# Patient Record
Sex: Male | Born: 1940 | Race: White | Hispanic: No | Marital: Married | Smoking: Former smoker
Health system: Southern US, Community
[De-identification: ages and names within clinical notes are randomized; demographics above are authoritative.]

## PROBLEM LIST (undated history)

## (undated) DIAGNOSIS — R451 Restlessness and agitation: Secondary | ICD-10-CM

## (undated) DIAGNOSIS — K635 Polyp of colon: Secondary | ICD-10-CM

## (undated) DIAGNOSIS — K5900 Constipation, unspecified: Secondary | ICD-10-CM

## (undated) DIAGNOSIS — R296 Repeated falls: Secondary | ICD-10-CM

## (undated) DIAGNOSIS — N390 Urinary tract infection, site not specified: Secondary | ICD-10-CM

## (undated) DIAGNOSIS — R634 Abnormal weight loss: Secondary | ICD-10-CM

## (undated) DIAGNOSIS — R159 Full incontinence of feces: Secondary | ICD-10-CM

## (undated) DIAGNOSIS — F329 Major depressive disorder, single episode, unspecified: Principal | ICD-10-CM

## (undated) DIAGNOSIS — N312 Flaccid neuropathic bladder, not elsewhere classified: Secondary | ICD-10-CM

## (undated) DIAGNOSIS — I10 Essential (primary) hypertension: Secondary | ICD-10-CM

## (undated) DIAGNOSIS — R32 Unspecified urinary incontinence: Secondary | ICD-10-CM

## (undated) DIAGNOSIS — E785 Hyperlipidemia, unspecified: Secondary | ICD-10-CM

## (undated) DIAGNOSIS — E119 Type 2 diabetes mellitus without complications: Secondary | ICD-10-CM

## (undated) DIAGNOSIS — G9340 Encephalopathy, unspecified: Secondary | ICD-10-CM

## (undated) DIAGNOSIS — F411 Generalized anxiety disorder: Secondary | ICD-10-CM

## (undated) DIAGNOSIS — D649 Anemia, unspecified: Secondary | ICD-10-CM

## (undated) DIAGNOSIS — I639 Cerebral infarction, unspecified: Secondary | ICD-10-CM

## (undated) DIAGNOSIS — J159 Unspecified bacterial pneumonia: Secondary | ICD-10-CM

## (undated) DIAGNOSIS — N189 Chronic kidney disease, unspecified: Secondary | ICD-10-CM

## (undated) DIAGNOSIS — R29898 Other symptoms and signs involving the musculoskeletal system: Secondary | ICD-10-CM

## (undated) DIAGNOSIS — S2231XA Fracture of one rib, right side, initial encounter for closed fracture: Secondary | ICD-10-CM

## (undated) DIAGNOSIS — F419 Anxiety disorder, unspecified: Secondary | ICD-10-CM

## (undated) DIAGNOSIS — F0390 Unspecified dementia without behavioral disturbance: Secondary | ICD-10-CM

## (undated) HISTORY — DX: Major depressive disorder, single episode, unspecified: F32.9

## (undated) HISTORY — DX: Type 2 diabetes mellitus without complications: E11.9

## (undated) HISTORY — DX: Abnormal weight loss: R63.4

## (undated) HISTORY — DX: Constipation, unspecified: K59.00

## (undated) HISTORY — DX: Urinary tract infection, site not specified: N39.0

## (undated) HISTORY — PX: BACK SURGERY: SHX140

## (undated) HISTORY — DX: Restlessness and agitation: R45.1

## (undated) HISTORY — DX: Cerebral infarction, unspecified: I63.9

## (undated) HISTORY — DX: Unspecified dementia without behavioral disturbance: F03.90

## (undated) HISTORY — DX: Essential (primary) hypertension: I10

## (undated) HISTORY — DX: Repeated falls: R29.6

## (undated) HISTORY — DX: Polyp of colon: K63.5

## (undated) HISTORY — DX: Fracture of one rib, right side, initial encounter for closed fracture: S22.31XA

## (undated) HISTORY — DX: Full incontinence of feces: R15.9

## (undated) HISTORY — DX: Encephalopathy, unspecified: G93.40

## (undated) HISTORY — DX: Other symptoms and signs involving the musculoskeletal system: R29.898

## (undated) HISTORY — DX: Unspecified urinary incontinence: R32

## (undated) HISTORY — DX: Flaccid neuropathic bladder, not elsewhere classified: N31.2

## (undated) HISTORY — DX: Generalized anxiety disorder: F41.1

---

## 2001-12-21 ENCOUNTER — Encounter: Admission: RE | Admit: 2001-12-21 | Discharge: 2002-03-21 | Payer: Self-pay | Admitting: *Deleted

## 2008-10-04 ENCOUNTER — Emergency Department (HOSPITAL_COMMUNITY): Admission: EM | Admit: 2008-10-04 | Discharge: 2008-10-04 | Payer: Self-pay | Admitting: Family Medicine

## 2008-10-06 ENCOUNTER — Emergency Department (HOSPITAL_COMMUNITY): Admission: EM | Admit: 2008-10-06 | Discharge: 2008-10-06 | Payer: Self-pay | Admitting: Family Medicine

## 2008-10-16 ENCOUNTER — Emergency Department (HOSPITAL_COMMUNITY): Admission: EM | Admit: 2008-10-16 | Discharge: 2008-10-16 | Payer: Self-pay | Admitting: Family Medicine

## 2010-08-06 ENCOUNTER — Ambulatory Visit (HOSPITAL_BASED_OUTPATIENT_CLINIC_OR_DEPARTMENT_OTHER)
Admission: RE | Admit: 2010-08-06 | Discharge: 2010-08-06 | Disposition: A | Discharge status: Home / Self Care | Payer: Medicare Other | Source: Ambulatory Visit | Attending: Otolaryngology | Admitting: Otolaryngology

## 2010-08-06 ENCOUNTER — Other Ambulatory Visit: Payer: Self-pay | Admitting: Otolaryngology

## 2010-08-06 ENCOUNTER — Ambulatory Visit (HOSPITAL_BASED_OUTPATIENT_CLINIC_OR_DEPARTMENT_OTHER): Admission: RE | Admit: 2010-08-06 | Payer: Medicare Other | Source: Ambulatory Visit | Admitting: Otolaryngology

## 2010-08-06 DIAGNOSIS — K148 Other diseases of tongue: Secondary | ICD-10-CM | POA: Insufficient documentation

## 2010-12-02 NOTE — Op Note (Signed)
  NAMEAUTREY, HUMAN NO.:  [id number]  MEDICAL RECORD NO.:  [id number]           PATIENT TYPE:  O  LOCATION:  MRI                          FACILITY:  [facility]  PHYSICIAN:  Kristine Garbe. Ezzard Standing, M.D.DATE OF BIRTH:  [birth year]  DATE OF PROCEDURE:  08/06/2010 DATE OF DISCHARGE:                              OPERATIVE REPORT   PREOPERATIVE DIAGNOSIS:  Right lateral tongue lesion approximately 1-1.2 cm size.  POSTOPERATIVE DIAGNOSIS:  Right lateral tongue lesion approximately 1- 1.2 cm size.  OPERATION:  Wide excision of right lateral tongue lesion.  SURGEON:  Kristine Garbe. Ezzard Standing, MD  ANESTHESIA:  Local 1% Xylocaine with epinephrine.  COMPLICATIONS:  None.  BRIEF CLINICAL NOTE:  Reginald Patterson is a 70 year old gentleman who was noted a nodule on the right lateral tongue now for about 5 or 6 months. It is not painful, has not noticed much change in the last couple months.  On exam, has a pedunculated or papillomatous about 1-1.5 cm right anterolateral tongue lesion.  This is a slightly firm to palpation, but no ulceration.  He was taken to the operating room at this time for wide excision of right lateral tongue lesion.  DESCRIPTION OF PROCEDURE:  The tongue was injected with 4 mL with Xylocaine with epinephrine for local anesthetic.  The lesion was then elliptically excised and sent to pathology.  Hemostasis was obtained with cautery and the defect was closed with interrupted 4-0 Vicryl and 4- 0 chromic sutures.  Reginald Patterson tolerated this well and subsequently discharged home.  DISPOSITION:  Reginald Patterson was discharged home on amoxicillin 500 mg b.i.d. for 1 week, Tylenol and Motrin p.r.n. pain and we will have him follow up in office in 7-10 days for recheck and review pathology.          ______________________________ Kristine Garbe Ezzard Standing, M.D.     CEN/MEDQ  D:  08/06/2010  T:  08/07/2010  Job:  [id number]  cc:   Caryn Bee L. Little, M.D.  Electronically Signed  by Dillard Cannon M.D. on 08/16/2010 11:09:16 AM

## 2011-09-04 ENCOUNTER — Other Ambulatory Visit: Payer: Self-pay | Admitting: Family Medicine

## 2011-09-04 ENCOUNTER — Ambulatory Visit
Admission: RE | Admit: 2011-09-04 | Discharge: 2011-09-04 | Disposition: A | Discharge status: Home / Self Care | Payer: Medicare Other | Source: Ambulatory Visit | Attending: Family Medicine | Admitting: Family Medicine

## 2011-09-04 DIAGNOSIS — R531 Weakness: Secondary | ICD-10-CM

## 2011-09-04 DIAGNOSIS — R410 Disorientation, unspecified: Secondary | ICD-10-CM

## 2011-09-04 MED ORDER — IOHEXOL 300 MG/ML  SOLN
75.0000 mL | Freq: Once | INTRAMUSCULAR | Status: AC | PRN
  Administered 2011-09-04: 75 mL via INTRAVENOUS

## 2011-09-08 ENCOUNTER — Ambulatory Visit
Admission: RE | Admit: 2011-09-08 | Discharge: 2011-09-08 | Disposition: A | Discharge status: Home / Self Care | Payer: Medicare Other | Source: Ambulatory Visit | Attending: Family Medicine | Admitting: Family Medicine

## 2011-09-08 ENCOUNTER — Other Ambulatory Visit: Payer: Self-pay | Admitting: Family Medicine

## 2011-09-08 DIAGNOSIS — I639 Cerebral infarction, unspecified: Secondary | ICD-10-CM

## 2011-09-08 DIAGNOSIS — I6381 Other cerebral infarction due to occlusion or stenosis of small artery: Secondary | ICD-10-CM

## 2011-09-10 ENCOUNTER — Other Ambulatory Visit: Payer: Self-pay | Admitting: Family Medicine

## 2011-09-10 DIAGNOSIS — I6381 Other cerebral infarction due to occlusion or stenosis of small artery: Secondary | ICD-10-CM

## 2011-09-12 ENCOUNTER — Ambulatory Visit
Admission: RE | Admit: 2011-09-12 | Discharge: 2011-09-12 | Disposition: A | Discharge status: Home / Self Care | Payer: Medicare Other | Source: Ambulatory Visit | Attending: Family Medicine | Admitting: Family Medicine

## 2011-09-12 DIAGNOSIS — I6381 Other cerebral infarction due to occlusion or stenosis of small artery: Secondary | ICD-10-CM

## 2011-10-02 ENCOUNTER — Encounter (HOSPITAL_COMMUNITY): Payer: Self-pay

## 2011-10-02 ENCOUNTER — Emergency Department (HOSPITAL_COMMUNITY): Payer: Medicare Other

## 2011-10-02 ENCOUNTER — Inpatient Hospital Stay (HOSPITAL_COMMUNITY)
Admission: EM | Admit: 2011-10-02 | Discharge: 2011-10-04 | DRG: 065 | Disposition: A | Discharge status: Home Health | Payer: Medicare Other | Attending: Internal Medicine | Admitting: Internal Medicine

## 2011-10-02 DIAGNOSIS — F329 Major depressive disorder, single episode, unspecified: Secondary | ICD-10-CM

## 2011-10-02 DIAGNOSIS — Z7902 Long term (current) use of antithrombotics/antiplatelets: Secondary | ICD-10-CM

## 2011-10-02 DIAGNOSIS — Z794 Long term (current) use of insulin: Secondary | ICD-10-CM

## 2011-10-02 DIAGNOSIS — Z79899 Other long term (current) drug therapy: Secondary | ICD-10-CM

## 2011-10-02 DIAGNOSIS — Z8673 Personal history of transient ischemic attack (TIA), and cerebral infarction without residual deficits: Secondary | ICD-10-CM

## 2011-10-02 DIAGNOSIS — D649 Anemia, unspecified: Secondary | ICD-10-CM | POA: Diagnosis present

## 2011-10-02 DIAGNOSIS — N312 Flaccid neuropathic bladder, not elsewhere classified: Secondary | ICD-10-CM | POA: Diagnosis present

## 2011-10-02 DIAGNOSIS — I6340 Cerebral infarction due to embolism of unspecified cerebral artery: Secondary | ICD-10-CM

## 2011-10-02 DIAGNOSIS — E119 Type 2 diabetes mellitus without complications: Secondary | ICD-10-CM | POA: Diagnosis present

## 2011-10-02 DIAGNOSIS — I10 Essential (primary) hypertension: Secondary | ICD-10-CM | POA: Diagnosis present

## 2011-10-02 DIAGNOSIS — E785 Hyperlipidemia, unspecified: Secondary | ICD-10-CM | POA: Diagnosis present

## 2011-10-02 DIAGNOSIS — R5381 Other malaise: Secondary | ICD-10-CM

## 2011-10-02 DIAGNOSIS — I6330 Cerebral infarction due to thrombosis of unspecified cerebral artery: Secondary | ICD-10-CM

## 2011-10-02 DIAGNOSIS — I639 Cerebral infarction, unspecified: Secondary | ICD-10-CM | POA: Diagnosis present

## 2011-10-02 DIAGNOSIS — Z87891 Personal history of nicotine dependence: Secondary | ICD-10-CM

## 2011-10-02 DIAGNOSIS — I672 Cerebral atherosclerosis: Secondary | ICD-10-CM | POA: Diagnosis present

## 2011-10-02 DIAGNOSIS — F341 Dysthymic disorder: Secondary | ICD-10-CM | POA: Diagnosis present

## 2011-10-02 DIAGNOSIS — F32A Depression, unspecified: Secondary | ICD-10-CM

## 2011-10-02 DIAGNOSIS — N390 Urinary tract infection, site not specified: Secondary | ICD-10-CM | POA: Diagnosis present

## 2011-10-02 DIAGNOSIS — G8190 Hemiplegia, unspecified affecting unspecified side: Secondary | ICD-10-CM | POA: Diagnosis present

## 2011-10-02 DIAGNOSIS — R5383 Other fatigue: Secondary | ICD-10-CM

## 2011-10-02 DIAGNOSIS — I6350 Cerebral infarction due to unspecified occlusion or stenosis of unspecified cerebral artery: Principal | ICD-10-CM | POA: Diagnosis present

## 2011-10-02 DIAGNOSIS — E782 Mixed hyperlipidemia: Secondary | ICD-10-CM

## 2011-10-02 DIAGNOSIS — E1165 Type 2 diabetes mellitus with hyperglycemia: Secondary | ICD-10-CM

## 2011-10-02 HISTORY — DX: Anxiety disorder, unspecified: F41.9

## 2011-10-02 HISTORY — DX: Cerebral infarction, unspecified: I63.9

## 2011-10-02 HISTORY — DX: Hyperlipidemia, unspecified: E78.5

## 2011-10-02 HISTORY — DX: Essential (primary) hypertension: I10

## 2011-10-02 HISTORY — DX: Type 2 diabetes mellitus without complications: E11.9

## 2011-10-02 LAB — DIFFERENTIAL
Eosinophils Absolute: 0.1 10*3/uL (ref 0.0–0.7)
Eosinophils Relative: 1 % (ref 0–5)
Lymphocytes Relative: 10 % — ABNORMAL LOW (ref 12–46)
Lymphs Abs: 1.2 10*3/uL (ref 0.7–4.0)
Monocytes Relative: 16 % — ABNORMAL HIGH (ref 3–12)

## 2011-10-02 LAB — CBC
Hemoglobin: 12.2 g/dL — ABNORMAL LOW (ref 13.0–17.0)
MCH: 28.2 pg (ref 26.0–34.0)
MCV: 84.5 fL (ref 78.0–100.0)
RBC: 4.32 MIL/uL (ref 4.22–5.81)
WBC: 12.1 10*3/uL — ABNORMAL HIGH (ref 4.0–10.5)

## 2011-10-02 LAB — COMPREHENSIVE METABOLIC PANEL
ALT: 17 U/L (ref 0–53)
Alkaline Phosphatase: 125 U/L — ABNORMAL HIGH (ref 39–117)
BUN: 11 mg/dL (ref 6–23)
CO2: 26 mEq/L (ref 19–32)
Calcium: 9.8 mg/dL (ref 8.4–10.5)
GFR calc Af Amer: >90 mL/min (ref >90)
GFR calc non Af Amer: >90 mL/min (ref >90)
Glucose, Bld: 134 mg/dL — ABNORMAL HIGH (ref 70–99)
Potassium: 4.2 mEq/L (ref 3.5–5.1)
Total Protein: 6.8 g/dL (ref 6.0–8.3)

## 2011-10-02 LAB — URINE MICROSCOPIC-ADD ON

## 2011-10-02 LAB — URINALYSIS, ROUTINE W REFLEX MICROSCOPIC
Bilirubin Urine: NEGATIVE
Ketones, ur: NEGATIVE mg/dL
Nitrite: POSITIVE — AB
Urobilinogen, UA: 1 mg/dL (ref 0.0–1.0)
pH: 7 (ref 5.0–8.0)

## 2011-10-02 MED ORDER — GADOBENATE DIMEGLUMINE 529 MG/ML IV SOLN
15.0000 mL | Freq: Once | INTRAVENOUS | Status: AC
  Administered 2011-10-02: 15 mL via INTRAVENOUS

## 2011-10-02 MED ORDER — PIPERACILLIN-TAZOBACTAM 3.375 G IVPB 30 MIN
3.3750 g | Freq: Three times a day (TID) | INTRAVENOUS | Status: DC
  Administered 2011-10-02: 3.375 g via INTRAVENOUS
  Filled 2011-10-02 (×3): qty 50

## 2011-10-02 MED ORDER — ACETAMINOPHEN 325 MG PO TABS
975.0000 mg | ORAL_TABLET | Freq: Once | ORAL | Status: AC
  Administered 2011-10-02: 975 mg via ORAL
  Filled 2011-10-02 (×2): qty 3

## 2011-10-02 MED ORDER — DEXTROSE 5 % IV SOLN
1.0000 g | Freq: Once | INTRAVENOUS | Status: AC
  Administered 2011-10-02: 1 g via INTRAVENOUS
  Filled 2011-10-02: qty 10

## 2011-10-02 NOTE — ED Notes (Signed)
Per EMS- Patient started to feel weak last night approx. 9pm. Patient stated he remained weak this morning and laying on cough most of day. Primary care doctor advised patient to come to ED to be evaluated. BP 138/80 HR 86. No pain until on way to [facility] and patient c/o of headache. CBG 130. Patient hx of stroke x 3 weeks ago with no deficits from stroke.

## 2011-10-02 NOTE — Consult Note (Signed)
Referring Physician: Estell Harpin    Chief Complaint: Weakness  HPI: Reginald Patterson is an 71 y.o. male who today awakened early at about 5AM feeling weak.  He went back to sleep and when he awakened again later he was no better and in fact per the wife was weaker.  EMS was called at that time and patient was brought in for evaluation.  Patient reports that about a month ago he had an episode of confusion with weakness that was felt to be secondaryto low blood sugar.  Symptoms did not clear and a work up was performed as an outpatient that included an MRI of the brain.  An acute infarct was diagnosed and patient had a resultant work up that included a carotid doppler and echo.  I do not have those reports for review.  Patient was started on Plavix and has been improving until today.  MRI performed here in the ED shows a right posterior frontal lobe acute white matter infarct.  LSN: 10/01/2011 tPA Given: No: Outside time window  Past Medical History  Diagnosis Date  . Diabetes mellitus   . Stroke   . Hypertension   . Hyperlipidemia   . Anxiety     Past Surgical History  Procedure Date  . Back surgery     1996- metal plate and screws    Family History  Problem Relation Age of Onset  . Dementia Mother   . Lung cancer Father    Social History:  reports that he has quit smoking. He has quit using smokeless tobacco. His smokeless tobacco use included Chew. He reports that he does not drink alcohol or use illicit drugs.  Allergies:  Allergies  Allergen Reactions  . Ciprofloxacin Rash  . Vicodin (Hydrocodone-Acetaminophen) Rash    Medications: I have reviewed the patient's current medications. Prior to Admission:  Xanax, Plavix, Lantus, Lisinopril, Glucophage, Paxil, Miralax, Zocor, Effexor  ROS: History obtained from the patient  General ROS: weakness Psychological ROS: episodes of confusion Ophthalmic ROS: negative for - blurry vision, double vision, eye pain or loss of vision ENT  ROS: negative for - epistaxis, nasal discharge, oral lesions, sore throat, tinnitus or vertigo Allergy and Immunology ROS: negative for - hives or itchy/watery eyes Hematological and Lymphatic ROS: negative for - bleeding problems, bruising or swollen lymph nodes Endocrine ROS: negative for - galactorrhea, hair pattern changes, polydipsia/polyuria or temperature intolerance Respiratory ROS: negative for - cough, hemoptysis, shortness of breath or wheezing Cardiovascular ROS: negative for - chest pain, dyspnea on exertion, edema or irregular heartbeat Gastrointestinal ROS: guaiac positive stools Genito-Urinary ROS: requires catheterization Musculoskeletal ROS: negative for - joint swelling or muscular weakness Neurological ROS: as noted in HPI Dermatological ROS: negative for rash and skin lesion changes  Physical Examination: Blood pressure 119/92, pulse 92, temperature 99.2 F (37.3 C), temperature source Oral, resp. rate 21, SpO2 94.00%.  Neurologic Examination: Mental Status: Alert, oriented, thought content appropriate.  Speech fluent without evidence of aphasia.  Able to follow 3 step commands without difficulty. Cranial Nerves: II: visual fields grossly normal, pupils equal, round, reactive to light and accommodation III,IV, VI: ptosis not present, extra-ocular motions intact bilaterally V,VII: smile symmetric, facial light touch sensation normal bilaterally VIII: hearing normal bilaterally IX,X: gag reflex present XI: trapezius strength/neck flexion strength normal bilaterally XII: tongue strength normal  Motor: Right : Upper extremity   5/5    Left:     Upper extremity   5/5  Lower extremity   5/5  Lower extremity   5/5 Tone and bulk:normal tone throughout; no atrophy noted Sensory: Pinprick and light touch intact throughout, bilaterally Deep Tendon Reflexes: 2+ in the upper extremities, absent at the knees, 1+ at the left AJ, absent at the right AJ. Plantars: Right:  upgoing   Left: downgoing Cerebellar: Dysmetria with left heel-to-shin   Results for orders placed during the hospital encounter of 10/02/11 (from the past 48 hour(s))  CBC     Status: Abnormal   Collection Time   10/02/11  2:33 PM      Component Value Range Comment   WBC 12.1 (*) 4.0 - 10.5 (K/uL)    RBC 4.32  4.22 - 5.81 (MIL/uL)    Hemoglobin 12.2 (*) 13.0 - 17.0 (g/dL)    HCT 16.1 (*) 09.6 - 52.0 (%)    MCV 84.5  78.0 - 100.0 (fL)    MCH 28.2  26.0 - 34.0 (pg)    MCHC 33.4  30.0 - 36.0 (g/dL)    RDW 04.5  40.9 - 81.1 (%)    Platelets 243  150 - 400 (K/uL)   DIFFERENTIAL     Status: Abnormal   Collection Time   10/02/11  2:33 PM      Component Value Range Comment   Neutrophils Relative 74  43 - 77 (%)    Neutro Abs 9.0 (*) 1.7 - 7.7 (K/uL)    Lymphocytes Relative 10 (*) 12 - 46 (%)    Lymphs Abs 1.2  0.7 - 4.0 (K/uL)    Monocytes Relative 16 (*) 3 - 12 (%)    Monocytes Absolute 1.9 (*) 0.1 - 1.0 (K/uL)    Eosinophils Relative 1  0 - 5 (%)    Eosinophils Absolute 0.1  0.0 - 0.7 (K/uL)    Basophils Relative 0  0 - 1 (%)    Basophils Absolute 0.0  0.0 - 0.1 (K/uL)   COMPREHENSIVE METABOLIC PANEL     Status: Abnormal   Collection Time   10/02/11  2:33 PM      Component Value Range Comment   Sodium 133 (*) 135 - 145 (mEq/L)    Potassium 4.2  3.5 - 5.1 (mEq/L)    Chloride 97  96 - 112 (mEq/L)    CO2 26  19 - 32 (mEq/L)    Glucose, Bld 134 (*) 70 - 99 (mg/dL)    BUN 11  6 - 23 (mg/dL)    Creatinine, Ser 9.14  0.50 - 1.35 (mg/dL)    Calcium 9.8  8.4 - 10.5 (mg/dL)    Total Protein 6.8  6.0 - 8.3 (g/dL)    Albumin 3.3 (*) 3.5 - 5.2 (g/dL)    AST 17  0 - 37 (U/L)    ALT 17  0 - 53 (U/L)    Alkaline Phosphatase 125 (*) 39 - 117 (U/L)    Total Bilirubin 0.3  0.3 - 1.2 (mg/dL)    GFR calc non Af Amer >90  >90 (mL/min)    GFR calc Af Amer >90  >90 (mL/min)   TROPONIN I     Status: Normal   Collection Time   10/02/11  2:33 PM      Component Value Range Comment   Troponin I <0.30   <0.30 (ng/mL)   URINALYSIS, ROUTINE W REFLEX MICROSCOPIC     Status: Abnormal   Collection Time   10/02/11  4:06 PM      Component Value Range Comment   Color, Urine YELLOW  YELLOW  APPearance CLOUDY (*) CLEAR     Specific Gravity, Urine 1.013  1.005 - 1.030     pH 7.0  5.0 - 8.0     Glucose, UA NEGATIVE  NEGATIVE (mg/dL)    Hgb urine dipstick TRACE (*) NEGATIVE     Bilirubin Urine NEGATIVE  NEGATIVE     Ketones, ur NEGATIVE  NEGATIVE (mg/dL)    Protein, ur 30 (*) NEGATIVE (mg/dL)    Urobilinogen, UA 1.0  0.0 - 1.0 (mg/dL)    Nitrite POSITIVE (*) NEGATIVE     Leukocytes, UA LARGE (*) NEGATIVE    URINE MICROSCOPIC-ADD ON     Status: Abnormal   Collection Time   10/02/11  4:06 PM      Component Value Range Comment   WBC, UA TOO NUMEROUS TO COUNT  <3 (WBC/hpf)    RBC / HPF 0-2  <3 (RBC/hpf)    Bacteria, UA MANY (*) RARE     Dg Chest 2 View  10/02/2011  *RADIOLOGY REPORT*  Clinical Data: Stroke 2 weeks ago.  CHEST - 2 VIEW  Comparison: None.  Findings: Lung volumes are low but the lungs are clear.  Heart size is normal.  No pneumothorax or pleural fluid.  There are some mildly prominent loops of small and large bowel in the upper abdomen.  IMPRESSION:  1.  No acute cardiopulmonary disease. 2.  Mildly prominent gas-filled loops of small large bowel may be due to ileus. If the patient has abdominal complaints, plain films of the abdomen could be used for further evaluation.  Original Report Authenticated By: Bernadene Bell. Maricela Curet, M.D.   [name] Wo Contrast  10/02/2011  *RADIOLOGY REPORT*  Clinical Data: Weakness, worse on the left.  MRI HEAD WITHOUT AND WITH CONTRAST  Technique:  Multiplanar, multiecho pulse sequences of the brain and surrounding structures were obtained according to standard protocol without and with intravenous contrast  Contrast: 15mL MULTIHANCE GADOBENATE DIMEGLUMINE 529 MG/ML IV SOLN  Comparison: CT head without and with contrast 09/04/2011.  Findings: The diffusion  weighted images demonstrates a linear area of non hemorrhagic infarction in the posterior right frontal lobe measuring 10 mm.  The lesion is less obvious on the ADC but does not represent shine through.  Remote lacunar infarcts are present on the right (85 and bilateral basal ganglia, promptly on the right.  Extensive periventricular subcortical white matter changes are evident bilaterally, advanced for age.  Flow is present in the major intracranial arteries.  The globes orbits are intact.  Paranasal sinuses are clear.  Fluid is present in the right mastoid air cells.  No obstructing nasopharyngeal lesion is evident.  Post contrast images straight no pathologic enhancement.  IMPRESSION:  1.  Acute / subacute non hemorrhagic infarct of the posterior left frontal lobe white matter.  This could contribute to the patient's weakness, likely on the right. 2.  Atrophy and extensive white matter disease.  This likely reflects the sequelae of chronic microvascular ischemia. 3.  Remote lacunar infarcts of the right corona radiata and bilateral basal ganglia.  Original Report Authenticated By: Jamesetta Orleans. MATTERN, M.D.    Assessment: 71 y.o. male presenting with weakness and acute right white matter infarct.  Has had infarct within the past month as well.  Further work up indicated.  Would not repeat work up already performed.    Stroke Risk Factors - diabetes mellitus, hyperlipidemia, hypertension and recent infarct  Plan: 1. HgbA1c, fasting lipid panel, homocysteine level, ESR 2. PT consult, OT  consult, Speech consult 3. Consider need for TEE 4. Follow up results of recently performed echo and carotid dopplers 5. Prophylactic therapy-continue Plavix.  Would not start ASA at this time but would have patient cleared by GI first since with recent guaiac positive stool.   6. Risk factor modification 7. Telemetry monitoring with low threshhold for holter.   8. Frequent neuro checks   Thana Farr,  MD [facility] [phone] 10/02/2011, 10:14 PM

## 2011-10-02 NOTE — H&P (Signed)
Reginald Patterson is an 71 y.o. male.   PCP - Dr.Kevin Little. Chief Complaint: Weakness. 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 Patient's wife states since breaking up in the morning the patient has been feeling weak he had something at 5 AM and went back to sleep. Which was unusual. At around 11 AM patient was waken up for breakfast after which he still felt sleepy so they called her PCPs office and patient was instructed to come to ER. In the ER MRI of the brain shows acute/subacute infarct and patient has been admitted for further workup. As per wife patient had similar symptoms a month ago and they had called EMS. EMS felt the patient may have had an hypoglycemic episode. And the following week patient had followed with PCP and had a CT head was done which showed infarct and also had carotid Doppler, MRA of the brain and 2-D echo. Except for the MRA brain results do not have access to the other results. Patient otherwise denies any shortness of breath, nausea vomiting, palpitations, chest pain, abdominal pain diarrhea or dysuria.    Past Medical History  Diagnosis Date  . Diabetes mellitus   . Stroke   . Hypertension   . Hyperlipidemia   . Anxiety     Past Surgical History  Procedure Date  . Back surgery     1996- metal plate and screws    Family History  Problem Relation Age of Onset  . Dementia Mother   . Lung cancer Father    Social History:  reports that he has quit smoking. He has quit using smokeless tobacco. His smokeless tobacco use included Chew. He reports that he does not drink alcohol or use illicit drugs.  Allergies:  Allergies  Allergen Reactions  . Ciprofloxacin Rash  . Vicodin (Hydrocodone-Acetaminophen) Rash     (Not in a hospital admission)  Results for orders placed during the hospital encounter of 10/02/11  (from the past 48 hour(s))  CBC     Status: Abnormal   Collection Time   10/02/11  2:33 PM      Component Value Range Comment   WBC 12.1 (*) 4.0 - 10.5 (K/uL)    RBC 4.32  4.22 - 5.81 (MIL/uL)    Hemoglobin 12.2 (*) 13.0 - 17.0 (g/dL)    HCT 95.2 (*) 84.1 - 52.0 (%)    MCV 84.5  78.0 - 100.0 (fL)    MCH 28.2  26.0 - 34.0 (pg)    MCHC 33.4  30.0 - 36.0 (g/dL)    RDW 32.4  40.1 - 02.7 (%)    Platelets 243  150 - 400 (K/uL)   DIFFERENTIAL     Status: Abnormal   Collection Time   10/02/11  2:33 PM      Component Value Range Comment   Neutrophils Relative 74  43 - 77 (%)    Neutro Abs 9.0 (*) 1.7 - 7.7 (K/uL)    Lymphocytes Relative 10 (*) 12 - 46 (%)    Lymphs Abs 1.2  0.7 - 4.0 (K/uL)    Monocytes Relative 16 (*) 3 - 12 (%)    Monocytes Absolute 1.9 (*) 0.1 - 1.0 (K/uL)    Eosinophils Relative 1  0 - 5 (%)    Eosinophils Absolute 0.1  0.0 - 0.7 (K/uL)    Basophils Relative 0  0 - 1 (%)  Basophils Absolute 0.0  0.0 - 0.1 (K/uL)   COMPREHENSIVE METABOLIC PANEL     Status: Abnormal   Collection Time   10/02/11  2:33 PM      Component Value Range Comment   Sodium 133 (*) 135 - 145 (mEq/L)    Potassium 4.2  3.5 - 5.1 (mEq/L)    Chloride 97  96 - 112 (mEq/L)    CO2 26  19 - 32 (mEq/L)    Glucose, Bld 134 (*) 70 - 99 (mg/dL)    BUN 11  6 - 23 (mg/dL)    Creatinine, Ser 9.60  0.50 - 1.35 (mg/dL)    Calcium 9.8  8.4 - 10.5 (mg/dL)    Total Protein 6.8  6.0 - 8.3 (g/dL)    Albumin 3.3 (*) 3.5 - 5.2 (g/dL)    AST 17  0 - 37 (U/L)    ALT 17  0 - 53 (U/L)    Alkaline Phosphatase 125 (*) 39 - 117 (U/L)    Total Bilirubin 0.3  0.3 - 1.2 (mg/dL)    GFR calc non Af Amer >90  >90 (mL/min)    GFR calc Af Amer >90  >90 (mL/min)   TROPONIN I     Status: Normal   Collection Time   10/02/11  2:33 PM      Component Value Range Comment   Troponin I <0.30  <0.30 (ng/mL)   URINALYSIS, ROUTINE W REFLEX MICROSCOPIC     Status: Abnormal   Collection Time   10/02/11  4:06 PM      Component Value Range  Comment   Color, Urine YELLOW  YELLOW     APPearance CLOUDY (*) CLEAR     Specific Gravity, Urine 1.013  1.005 - 1.030     pH 7.0  5.0 - 8.0     Glucose, UA NEGATIVE  NEGATIVE (mg/dL)    Hgb urine dipstick TRACE (*) NEGATIVE     Bilirubin Urine NEGATIVE  NEGATIVE     Ketones, ur NEGATIVE  NEGATIVE (mg/dL)    Protein, ur 30 (*) NEGATIVE (mg/dL)    Urobilinogen, UA 1.0  0.0 - 1.0 (mg/dL)    Nitrite POSITIVE (*) NEGATIVE     Leukocytes, UA LARGE (*) NEGATIVE    URINE MICROSCOPIC-ADD ON     Status: Abnormal   Collection Time   10/02/11  4:06 PM      Component Value Range Comment   WBC, UA TOO NUMEROUS TO COUNT  <3 (WBC/hpf)    RBC / HPF 0-2  <3 (RBC/hpf)    Bacteria, UA MANY (*) RARE     Dg Chest 2 View  10/02/2011  *RADIOLOGY REPORT*  Clinical Data: Stroke 2 weeks ago.  CHEST - 2 VIEW  Comparison: None.  Findings: Lung volumes are low but the lungs are clear.  Heart size is normal.  No pneumothorax or pleural fluid.  There are some mildly prominent loops of small and large bowel in the upper abdomen.  IMPRESSION:  1.  No acute cardiopulmonary disease. 2.  Mildly prominent gas-filled loops of small large bowel may be due to ileus. If the patient has abdominal complaints, plain films of the abdomen could be used for further evaluation.  Original Report Authenticated By: Bernadene Bell. Maricela Curet, M.D.   [name] Wo Contrast  10/02/2011  *RADIOLOGY REPORT*  Clinical Data: Weakness, worse on the left.  MRI HEAD WITHOUT AND WITH CONTRAST  Technique:  Multiplanar, multiecho pulse sequences of the brain and  surrounding structures were obtained according to standard protocol without and with intravenous contrast  Contrast: 15mL MULTIHANCE GADOBENATE DIMEGLUMINE 529 MG/ML IV SOLN  Comparison: CT head without and with contrast 09/04/2011.  Findings: The diffusion weighted images demonstrates a linear area of non hemorrhagic infarction in the posterior right frontal lobe measuring 10 mm.  The lesion is less  obvious on the ADC but does not represent shine through.  Remote lacunar infarcts are present on the right (85 and bilateral basal ganglia, promptly on the right.  Extensive periventricular subcortical white matter changes are evident bilaterally, advanced for age.  Flow is present in the major intracranial arteries.  The globes orbits are intact.  Paranasal sinuses are clear.  Fluid is present in the right mastoid air cells.  No obstructing nasopharyngeal lesion is evident.  Post contrast images straight no pathologic enhancement.  IMPRESSION:  1.  Acute / subacute non hemorrhagic infarct of the posterior left frontal lobe white matter.  This could contribute to the patient's weakness, likely on the right. 2.  Atrophy and extensive white matter disease.  This likely reflects the sequelae of chronic microvascular ischemia. 3.  Remote lacunar infarcts of the right corona radiata and bilateral basal ganglia.  Original Report Authenticated By: Jamesetta Orleans. MATTERN, M.D.    Review of Systems  Constitutional: Positive for malaise/fatigue.  HENT: Negative.   Eyes: Negative.   Respiratory: Negative.   Cardiovascular: Negative.   Gastrointestinal: Negative.   Genitourinary: Negative.   Musculoskeletal: Negative.   Skin: Negative.   Neurological: Positive for weakness.  Endo/Heme/Allergies: Negative.   Psychiatric/Behavioral: Negative.     Blood pressure 119/92, pulse 92, temperature 99.2 F (37.3 C), temperature source Oral, resp. rate 21, SpO2 94.00%. Physical Exam  Constitutional: He is oriented to person, place, and time. He appears well-developed and well-nourished. No distress.  HENT:  Head: Normocephalic and atraumatic.  Right Ear: External ear normal.  Left Ear: External ear normal.  Nose: Nose normal.  Mouth/Throat: Oropharynx is clear and moist. No oropharyngeal exudate.  Eyes: Conjunctivae are normal. Pupils are equal, round, and reactive to light. Right eye exhibits no discharge.  Left eye exhibits no discharge. No scleral icterus.  Neck: Normal range of motion. Neck supple.  Cardiovascular: Normal rate and regular rhythm.   Respiratory: Effort normal and breath sounds normal. No respiratory distress. He has no wheezes. He has no rales.  GI: Soft. Bowel sounds are normal. He exhibits no distension. There is no tenderness. There is no rebound.  Musculoskeletal: Normal range of motion. He exhibits no edema and no tenderness.  Neurological: He is alert and oriented to person, place, and time.       Moves all extremities 5/5. No facial asymmetry. Tongue is midline. Able to see in both eyes.  Skin: Skin is warm and dry. No rash noted. He is not diaphoretic. No erythema.  Psychiatric: His behavior is normal.     Assessment/Plan  #1. 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 Will be placed on neuro checks and has already passed swallow evaluation. I have consulted neurologist and we'll their recommendations as this is the second episode within a month's time. Patient had an MRA last month we will wait for neurologist opinion to see if we need another one this time. #2. UTI with history of hypotonic bladder and self-catheterization - we will treat this as complicated UTI with Zosyn. Check urine cultures. #3. Diabetes mellitus 2 - continue home medications.  Check CBGs with sliding scale coverage. #4. History of hypertension - continue home medications. #5. Hyperlipidemia - continue home medications. #6. History of depression - on 2 antidepressants. I did question about this. They say they have been taking this for many years. Once they tried to stop one of them the patient had significant pain in his hands and then had to restart both of them again. #7. Anemia - patient is scheduled to followup with Dr. Dulce Sellar as outpatient for further workup.  CODE STATUS - full code.  Eduard Clos 10/02/2011, 8:43 PM

## 2011-10-02 NOTE — ED Notes (Signed)
PA at bedside.

## 2011-10-02 NOTE — ED Provider Notes (Signed)
Medical screening examination/treatment/procedure(s) were conducted as a shared visit with non-physician practitioner(s) and myself.  I personally evaluated the patient during the encounter Pt with weakness.  Mri shows stroke.  Dx uti and cva  Reginald Lennert, MD 10/02/11 [phone]

## 2011-10-02 NOTE — Progress Notes (Signed)
ANTIBIOTIC CONSULT NOTE - INITIAL  Pharmacy Consult for zosyn Indication: Complicated UTI  Allergies  Allergen Reactions  . Ciprofloxacin Rash  . Vicodin (Hydrocodone-Acetaminophen) Rash     Vital Signs: Temp: 99.2 F (37.3 C) (05/09 1908) Temp src: Oral (05/09 1900) BP: 119/92 mmHg (05/09 2030) Pulse Rate: 92  (05/09 2030)  Labs:  Basename 10/02/11 1433  WBC 12.1*  HGB 12.2*  PLT 243  LABCREA --  CREATININE 0.76   CrCl~60-70 ml/min  Microbiology: No results found for this or any previous visit (from the past 720 hour(s)).  Medical History: Past Medical History  Diagnosis Date  . Diabetes mellitus   . Stroke   . Hypertension   . Hyperlipidemia   . Anxiety     Assessment: 71 yo male with UTI and history of hypotonic bladder and self-catheterization to start zosyn.   Plan:  -Will give zosyn 3.375gm IV q8hr -Will follow cultures and progress  Reginald Patterson 10/02/2011,10:25 PM

## 2011-10-02 NOTE — ED Provider Notes (Signed)
History     CSN: [id number]  Arrival date & time 10/02/11  1414   First MD Initiated Contact with Patient 10/02/11 1504      Chief Complaint  Patient presents with  . Weakness    (Consider location/radiation/quality/duration/timing/severity/associated sxs/prior treatment) HPI Patient presents the emergency department with weakness that began upon awakening this morning.  Patient was unable to get out of bed without assistance from his wife.  She states that she had to help him walk to the den area at their house.  The wife states that the patient seemed like he was having difficulty walking.  Wife states that several weeks ago.  Patient had a stroke was diagnosed by his primary care Dr. She states that at that time he was having confusion, difficulty walking, and strength issues.  Patient states that he is unsure why he is at the emergency room and does appear somewhat confused.  Past Medical History  Diagnosis Date  . Diabetes mellitus   . Stroke   . Hypertension   . Hyperlipidemia   . Anxiety     Past Surgical History  Procedure Date  . Back surgery     1996- metal plate and screws    History reviewed. No pertinent family history.  History  Substance Use Topics  . Smoking status: Former Games developer  . Smokeless tobacco: Former Neurosurgeon    Types: Chew  . Alcohol Use: No     only rarely      Review of Systems All other systems negative except as documented in the HPI. All pertinent positives and negatives as reviewed in the HPI.  Allergies  Ciprofloxacin and Vicodin  Home Medications   Current Outpatient Rx  Name Route Sig Dispense Refill  . ALPRAZOLAM 1 MG PO TABS Oral Take 0.5-1 mg by mouth 2 (two) times daily as needed. For anxiety    . CLOPIDOGREL BISULFATE 75 MG PO TABS Oral Take 75 mg by mouth daily.    . INSULIN GLARGINE 100 UNIT/ML [place] SOLN Subcutaneous Inject 35 Units into the skin at bedtime.    Marland Kitchen LISINOPRIL 5 MG PO TABS Oral Take 5 mg by mouth daily.    Marland Kitchen  METFORMIN HCL 850 MG PO TABS Oral Take 850 mg by mouth 2 (two) times daily with a meal.    . PAROXETINE HCL 40 MG PO TABS Oral Take 40 mg by mouth every morning.    Marland Kitchen POLYETHYLENE GLYCOL 3350 PO PACK Oral Take 17 g by mouth daily.    Marland Kitchen SIMVASTATIN 40 MG PO TABS Oral Take 40 mg by mouth every evening.    . VENLAFAXINE HCL ER 150 MG PO CP24 Oral Take 150 mg by mouth 2 (two) times daily.      BP 114/84  Pulse 102  Temp(Src) 99 F (37.2 C) (Oral)  Resp 25  SpO2 94%  Physical Exam  Constitutional: He appears well-developed and well-nourished. No distress.  HENT:  Head: Normocephalic and atraumatic.  Eyes: Pupils are equal, round, and reactive to light.  Neck: Normal range of motion. Neck supple.  Cardiovascular: Normal rate and normal heart sounds.  Exam reveals no gallop and no friction rub.   No murmur heard. Pulmonary/Chest: Effort normal and breath sounds normal. No respiratory distress.  Neurological: He is alert. Coordination normal.       Patient has difficulty with heel-to-shin especially with the left leg.  Patient has decreased strength on the left as well.  Patient has normal grip strengths  Skin: Skin is warm and dry. No rash noted.    ED Course  Procedures (including critical care time)  Labs Reviewed  CBC - Abnormal; Notable for the following:    WBC 12.1 (*)    Hemoglobin 12.2 (*)    HCT 36.5 (*)    All other components within normal limits  DIFFERENTIAL - Abnormal; Notable for the following:    Neutro Abs 9.0 (*)    Lymphocytes Relative 10 (*)    Monocytes Relative 16 (*)    Monocytes Absolute 1.9 (*)    All other components within normal limits  COMPREHENSIVE METABOLIC PANEL - Abnormal; Notable for the following:    Sodium 133 (*)    Glucose, Bld 134 (*)    Albumin 3.3 (*)    Alkaline Phosphatase 125 (*)    All other components within normal limits  URINALYSIS, ROUTINE W REFLEX MICROSCOPIC - Abnormal; Notable for the following:    APPearance CLOUDY (*)      Hgb urine dipstick TRACE (*)    Protein, ur 30 (*)    Nitrite POSITIVE (*)    Leukocytes, UA LARGE (*)    All other components within normal limits  URINE MICROSCOPIC-ADD ON - Abnormal; Notable for the following:    Bacteria, UA MANY (*)    All other components within normal limits  TROPONIN I   Dg Chest 2 View  10/02/2011  *RADIOLOGY REPORT*  Clinical Data: Stroke 2 weeks ago.  CHEST - 2 VIEW  Comparison: None.  Findings: Lung volumes are low but the lungs are clear.  Heart size is normal.  No pneumothorax or pleural fluid.  There are some mildly prominent loops of small and large bowel in the upper abdomen.  IMPRESSION:  1.  No acute cardiopulmonary disease. 2.  Mildly prominent gas-filled loops of small large bowel may be due to ileus. If the patient has abdominal complaints, plain films of the abdomen could be used for further evaluation.  Original Report Authenticated By: Bernadene Bell. Maricela Curet, M.D.   [name] Wo Contrast  10/02/2011  *RADIOLOGY REPORT*  Clinical Data: Weakness, worse on the left.  MRI HEAD WITHOUT AND WITH CONTRAST  Technique:  Multiplanar, multiecho pulse sequences of the brain and surrounding structures were obtained according to standard protocol without and with intravenous contrast  Contrast: 15mL MULTIHANCE GADOBENATE DIMEGLUMINE 529 MG/ML IV SOLN  Comparison: CT head without and with contrast 09/04/2011.  Findings: The diffusion weighted images demonstrates a linear area of non hemorrhagic infarction in the posterior right frontal lobe measuring 10 mm.  The lesion is less obvious on the ADC but does not represent shine through.  Remote lacunar infarcts are present on the right (85 and bilateral basal ganglia, promptly on the right.  Extensive periventricular subcortical white matter changes are evident bilaterally, advanced for age.  Flow is present in the major intracranial arteries.  The globes orbits are intact.  Paranasal sinuses are clear.  Fluid is present in the  right mastoid air cells.  No obstructing nasopharyngeal lesion is evident.  Post contrast images straight no pathologic enhancement.  IMPRESSION:  1.  Acute / subacute non hemorrhagic infarct of the posterior left frontal lobe white matter.  This could contribute to the patient's weakness, likely on the right. 2.  Atrophy and extensive white matter disease.  This likely reflects the sequelae of chronic microvascular ischemia. 3.  Remote lacunar infarcts of the right corona radiata and bilateral basal ganglia.  Original Report Authenticated  By: Saumya Hukill W. MATTERN, M.D.   Patient will be admitted to the hospital [facility].  Patient is treated for UTI along with his stroke findings.     MDM  MDM Reviewed: nursing note, vitals and previous chart Interpretation: labs, CT scan and x-ray Consults: admitting MD            Carlyle Dolly, PA-C 10/03/11 0050

## 2011-10-03 DIAGNOSIS — I6340 Cerebral infarction due to embolism of unspecified cerebral artery: Secondary | ICD-10-CM

## 2011-10-03 DIAGNOSIS — E782 Mixed hyperlipidemia: Secondary | ICD-10-CM

## 2011-10-03 DIAGNOSIS — R5381 Other malaise: Secondary | ICD-10-CM

## 2011-10-03 DIAGNOSIS — I10 Essential (primary) hypertension: Secondary | ICD-10-CM

## 2011-10-03 DIAGNOSIS — I6330 Cerebral infarction due to thrombosis of unspecified cerebral artery: Secondary | ICD-10-CM

## 2011-10-03 DIAGNOSIS — R5383 Other fatigue: Secondary | ICD-10-CM

## 2011-10-03 DIAGNOSIS — E1165 Type 2 diabetes mellitus with hyperglycemia: Secondary | ICD-10-CM

## 2011-10-03 LAB — COMPREHENSIVE METABOLIC PANEL
ALT: 23 U/L (ref 0–53)
AST: 21 U/L (ref 0–37)
Albumin: 3 g/dL — ABNORMAL LOW (ref 3.5–5.2)
Alkaline Phosphatase: 127 U/L — ABNORMAL HIGH (ref 39–117)
Calcium: 9.6 mg/dL (ref 8.4–10.5)
GFR calc Af Amer: >90 mL/min (ref >90)
Glucose, Bld: 158 mg/dL — ABNORMAL HIGH (ref 70–99)
Potassium: 4.7 mEq/L (ref 3.5–5.1)
Sodium: 138 mEq/L (ref 135–145)
Total Protein: 6.7 g/dL (ref 6.0–8.3)

## 2011-10-03 LAB — LIPID PANEL: Cholesterol: 125 mg/dL (ref 0–200)

## 2011-10-03 LAB — GLUCOSE, CAPILLARY
Glucose-Capillary: 179 mg/dL — ABNORMAL HIGH (ref 70–99)
Glucose-Capillary: 136 mg/dL — ABNORMAL HIGH (ref 70–99)
Glucose-Capillary: 188 mg/dL — ABNORMAL HIGH (ref 70–99)

## 2011-10-03 LAB — HEMOGLOBIN A1C: Mean Plasma Glucose: 157 mg/dL — ABNORMAL HIGH (ref <117)

## 2011-10-03 LAB — CBC
HCT: 35.4 % — ABNORMAL LOW (ref 39.0–52.0)
Hemoglobin: 11.9 g/dL — ABNORMAL LOW (ref 13.0–17.0)
MCHC: 33.6 g/dL (ref 30.0–36.0)
MCV: 83.9 fL (ref 78.0–100.0)

## 2011-10-03 MED ORDER — SODIUM CHLORIDE 0.9 % IV SOLN
INTRAVENOUS | Status: DC
  Administered 2011-10-03 (×2): via INTRAVENOUS

## 2011-10-03 MED ORDER — ALPRAZOLAM 0.5 MG PO TABS: 0.5000 mg | ORAL_TABLET | Freq: Two times a day (BID) | ORAL | Status: DC | PRN

## 2011-10-03 MED ORDER — CLOPIDOGREL BISULFATE 75 MG PO TABS
75.0000 mg | ORAL_TABLET | Freq: Every day | ORAL | Status: DC
  Administered 2011-10-03 – 2011-10-04 (×2): 75 mg via ORAL
  Filled 2011-10-03 (×2): qty 1

## 2011-10-03 MED ORDER — INSULIN GLARGINE 100 UNIT/ML ~~LOC~~ SOLN
35.0000 [IU] | Freq: Every day | SUBCUTANEOUS | Status: DC
  Administered 2011-10-03: 35 [IU] via SUBCUTANEOUS

## 2011-10-03 MED ORDER — ENOXAPARIN SODIUM 40 MG/0.4ML ~~LOC~~ SOLN
40.0000 mg | SUBCUTANEOUS | Status: DC
  Administered 2011-10-03 – 2011-10-04 (×2): 40 mg via SUBCUTANEOUS
  Filled 2011-10-03 (×2): qty 0.4

## 2011-10-03 MED ORDER — POLYETHYLENE GLYCOL 3350 17 G PO PACK
17.0000 g | PACK | Freq: Every day | ORAL | Status: DC
  Administered 2011-10-03 – 2011-10-04 (×2): 17 g via ORAL
  Filled 2011-10-03 (×2): qty 1

## 2011-10-03 MED ORDER — SENNOSIDES-DOCUSATE SODIUM 8.6-50 MG PO TABS: 1.0000 | ORAL_TABLET | Freq: Every evening | ORAL | Status: DC | PRN

## 2011-10-03 MED ORDER — LISINOPRIL 5 MG PO TABS
5.0000 mg | ORAL_TABLET | Freq: Every day | ORAL | Status: DC
  Administered 2011-10-03 – 2011-10-04 (×2): 5 mg via ORAL
  Filled 2011-10-03 (×2): qty 1

## 2011-10-03 MED ORDER — SIMVASTATIN 40 MG PO TABS
40.0000 mg | ORAL_TABLET | Freq: Every evening | ORAL | Status: DC
  Administered 2011-10-03: 40 mg via ORAL
  Filled 2011-10-03 (×2): qty 1

## 2011-10-03 MED ORDER — PIPERACILLIN-TAZOBACTAM 3.375 G IVPB
3.3750 g | Freq: Three times a day (TID) | INTRAVENOUS | Status: DC
  Administered 2011-10-03 – 2011-10-04 (×4): 3.375 g via INTRAVENOUS
  Filled 2011-10-03 (×6): qty 50

## 2011-10-03 MED ORDER — VENLAFAXINE HCL ER 150 MG PO CP24
150.0000 mg | ORAL_CAPSULE | Freq: Two times a day (BID) | ORAL | Status: DC
  Administered 2011-10-03 – 2011-10-04 (×4): 150 mg via ORAL
  Filled 2011-10-03 (×5): qty 1

## 2011-10-03 MED ORDER — PAROXETINE HCL 20 MG PO TABS
40.0000 mg | ORAL_TABLET | Freq: Every morning | ORAL | Status: DC
  Administered 2011-10-03 – 2011-10-04 (×2): 40 mg via ORAL
  Filled 2011-10-03 (×2): qty 2

## 2011-10-03 NOTE — Evaluation (Signed)
Speech Language Pathology Evaluation Patient Details Name: Reginald Patterson MRN: [id number] DOB: 01-Aug-1940 Today's Date: 10/03/2011 Time: 4098-1191 SLP Time Calculation (min): 26 min  Problem List:  Patient Active Problem List  Diagnoses  . CVA (cerebral infarction)  . UTI (lower urinary tract infection)  . HTN (hypertension)  . Hyperlipidemia  . Diabetes mellitus, type 2   Past Medical History:  Past Medical History  Diagnosis Date  . Diabetes mellitus   . Stroke   . Hypertension   . Hyperlipidemia   . Anxiety    Past Surgical History:  Past Surgical History  Procedure Date  . Back surgery     1996- metal plate and screws    Assessment / Plan / Recommendation Clinical Impression  Demonstrates suspected mild cognitive impairment characterized by working and short term memory deficits as well as decreased processing of new, complex information as related to memory, hard of hearing and awareness.  Difficult to determine if these are new or old deficits. Patient initially denied any changes in cognition, however he did eventually concede, stating "I don't want to admit it or talk about it."  Education provided with patient, his wife and daughter regarding allowing the patient to "do his own thinking" as his wife tends to answer all asked questions and organize everything for him.  This has likely been their dynamic during their 74 year marriage.  His wife and daughter verbalized their understanding.    SLP Assessment  Patient does not need any further Speech Lanaguage Pathology Services    Follow Up Recommendations  None    Pertinent Vitals/Pain n/a    SLP Evaluation Prior Functioning  Cognitive/Linguistic Baseline: Within functional limits Type of Home: Apartment Lives With: Spouse Available Help at Discharge: Family;Available 24 hours/day Education: [employer] Vocation: Retired   IT consultant  Overall Cognitive Status: Impaired Arousal/Alertness:  Awake/alert Memory: Impaired Memory Impairment: Storage deficit;Retrieval deficit;Decreased short term memory Awareness: Impaired Awareness Impairment: Anticipatory impairment;Emergent impairment Problem Solving: Impaired Problem Solving Impairment: Verbal complex;Functional complex Safety/Judgment: Appears intact    Comprehension  Auditory Comprehension Overall Auditory Comprehension: Impaired Yes/No Questions: Within Functional Limits Commands: Impaired Conversation: Complex Other Conversation Comments: Hard of hearing and suspected processing, auditory comprehension deficits impact higher level, abstract thinking Interfering Components: Hearing;Processing speed;Working Radio broadcast assistant: Barrister's clerk: Within [employer] Reading Comprehension Reading Status: Within funtional limits    Expression Expression Primary Mode of Expression: Verbal Verbal Expression Overall Verbal Expression: Appears within functional limits for tasks assessed Written Expression Dominant Hand: Right Written Expression: Within Functional Limits   Oral / Motor Oral Motor/Sensory Function Overall Oral Motor/Sensory Function: Appears within functional limits for tasks assessed Motor Speech Overall Motor Speech: Appears within functional limits for tasks assessed     Myra Rude, M.S.,CCC-SLP Pager [phone] 10/03/2011, 11:11 AM

## 2011-10-03 NOTE — Evaluation (Signed)
Physical Therapy Evaluation Patient Details Name: Reginald Patterson MRN: [id number] DOB: Mar 08, 1941 Today's Date: 10/03/2011 Time: 4098-1191 PT Time Calculation (min): 45 min  PT Assessment / Plan / Recommendation Clinical Impression  Pt presents with a medical diagnosis of CVA with balance and gait impairments. Pt will benefit from skilled PT in the acute care setting in order to maximize functional mobility, balance and safety prior to d/c    PT Assessment  Patient needs continued PT services    Follow Up Recommendations  Home health PT;Supervision/Assistance - 24 hour    Barriers to Discharge        lEquipment Recommendations  None recommended by SLP    Recommendations for Other Services     Frequency Min 4X/week    Precautions / Restrictions Precautions Precautions: Fall Restrictions Weight Bearing Restrictions: No         Mobility  Bed Mobility Bed Mobility: Sitting - Scoot to Edge of Bed;Supine to Sit Supine to Sit: 4: Min guard Sitting - Scoot to Edge of Bed: 4: Min guard Details for Bed Mobility Assistance: VC for proper sequencing. No physical assistance needed although pt required increased time and motivation to complete. Movement not fluid Transfers Transfers: Sit to Stand;Stand to Sit Sit to Stand: 4: Min assist;With upper extremity assist;From bed;From toilet Stand to Sit: 4: Min assist;With upper extremity assist;To chair/3-in-1;To toilet Details for Transfer Assistance: VC for hand placement and safety upon standing. Assist for forward translation and stability on ascent and descent. Ambulation/Gait Ambulation/Gait Assistance: 4: Min guard Ambulation Distance (Feet): 100 Feet Assistive device: None Ambulation/Gait Assistance Details: Attempted ambulation with RW, pt with improved gait without assistive device. Pt able to ambulate at a normal speed although with max encouragement. Pt with shuffling gait and stiff posture Gait Pattern: Decreased trunk  rotation;Narrow base of support;Shuffle;Step-to pattern Gait velocity: Slow gait speed Stairs: No Modified Rankin (Stroke Patients Only) Modified Rankin: Moderately severe disability        PT Diagnosis: Abnormality of gait  PT Problem List: Decreased activity tolerance;Decreased balance;Decreased mobility;Decreased safety awareness PT Treatment Interventions: DME instruction;Gait training;Stair training;Functional mobility training;Therapeutic activities;Therapeutic exercise;Balance training;Neuromuscular re-education;Patient/family education   PT Goals Acute Rehab PT Goals PT Goal Formulation: With patient Time For Goal Achievement: 10/10/11 Potential to Achieve Goals: Good Pt will go Supine/Side to Sit: with modified independence PT Goal: Supine/Side to Sit - Progress: Goal set today Pt will go Sit to Supine/Side: with modified independence PT Goal: Sit to Supine/Side - Progress: Goal set today Pt will go Sit to Stand: with modified independence PT Goal: Sit to Stand - Progress: Goal set today Pt will go Stand to Sit: with modified independence PT Goal: Stand to Sit - Progress: Goal set today Pt will Transfer Bed to Chair/Chair to Bed: with modified independence PT Transfer Goal: Bed to Chair/Chair to Bed - Progress: Goal set today Pt will Ambulate: >150 feet;with supervision;with least restrictive assistive device PT Goal: Ambulate - Progress: Goal set today Additional Goals Additional Goal #1: Pt will score a DGI of > 19 to indicate decreased fall risks in the community PT Goal: Additional Goal #1 - Progress: Goal set today  Visit Information  Last PT Received On: 10/03/11 Assistance Needed: +1 PT/OT Co-Evaluation/Treatment: Yes       Prior Functioning  Home Living Lives With: Spouse Available Help at Discharge: Family;Available 24 hours/day Type of Home: Apartment Home Access: Level entry Home Layout: One level Bathroom Shower/Tub: Tub/shower unit;Curtain Bathroom  Toilet: Handicapped height Bathroom Accessibility: Yes  How Accessible: Accessible via walker Home Adaptive Equipment: None Prior Function Level of Independence: Independent Able to Take Stairs?: Yes Driving: No Vocation: Retired Musician: HOH Dominant Hand: Right    Cognition  Overall Cognitive Status: Appears within functional limits for tasks assessed/performed Arousal/Alertness: Awake/alert Orientation Level: Appears intact for tasks assessed Behavior During Session: [facility] for tasks performed    Extremity/Trunk Assessment Right Upper Extremity Assessment RUE ROM/Strength/Tone: Within functional levels RUE Sensation: WFL - Light Touch;WFL - Proprioception RUE Coordination: WFL - gross/fine motor Left Upper Extremity Assessment LUE ROM/Strength/Tone: Within functional levels LUE Sensation: WFL - Light Touch;WFL - Proprioception LUE Coordination: WFL - gross/fine motor Right Lower Extremity Assessment RLE ROM/Strength/Tone: Within functional levels RLE Sensation: WFL - Light Touch RLE Coordination: WFL - gross/fine motor Left Lower Extremity Assessment LLE ROM/Strength/Tone: Within functional levels LLE Sensation: WFL - Light Touch LLE Coordination: WFL - gross/fine motor   Balance Balance Balance Assessed: Yes Standardized Balance Assessment Standardized Balance Assessment: Berg Balance Test;Dynamic Gait Index Dynamic Gait Index Level Surface: Mild Impairment Change in Gait Speed: Mild Impairment Gait and Pivot Turn: Normal Step Over Obstacle: Mild Impairment Step Around Obstacles: Mild Impairment  End of Session PT - End of Session Equipment Utilized During Treatment: Gait belt Activity Tolerance: Patient tolerated treatment well Patient left: in chair;with call bell/phone within reach;with family/visitor present Nurse Communication: Mobility status   Reginald Patterson 10/03/2011, 11:11 AM  10/03/2011 Reginald Patterson DPT PAGER:  [phone] OFFICE: [phone]

## 2011-10-03 NOTE — Progress Notes (Signed)
Occupational Therapy Evaluation Patient Details Name: Reginald Patterson MRN: [id number] DOB: 12-19-1940 Today's Date: 10/03/2011 Time: 4098-1191 OT Time Calculation (min): 40 min  OT Assessment / Plan / Recommendation Clinical Impression  Pt s/p Rt CVA. Pt appears to be near baseline level of function but experiencing some fatigue. All education complete with pt and spouse. No further acute OT needs.    OT Assessment  Patient does not need any further OT services    Follow Up Recommendations  Supervision/Assistance - 24 hour;No OT follow up    Barriers to Discharge      Equipment Recommendations  None recommended by OT    Recommendations for Other Services    Frequency       Precautions / Restrictions Precautions Precautions: Fall Restrictions Weight Bearing Restrictions: No   Pertinent Vitals/Pain N/A    ADL  Eating/Feeding: Performed;Modified independent Where Assessed - Eating/Feeding: Bed level Grooming: Performed;Wash/dry hands;Supervision/safety Where Assessed - Grooming: Standing at sink Upper Body Bathing: Simulated;Set up Where Assessed - Upper Body Bathing: Sitting, bed Lower Body Bathing: Simulated;Set up Where Assessed - Lower Body Bathing: Sitting, bed Upper Body Dressing: Performed;Set up Where Assessed - Upper Body Dressing: Sitting, bed Lower Body Dressing: Performed;Set up (donned socks) Where Assessed - Lower Body Dressing: Sitting, bed Toilet Transfer: Performed;Other (comment) (min guard) Toilet Transfer Method: Proofreader: Regular height toilet Toileting - Clothing Manipulation: Performed;Supervision/safety Where Assessed - Toileting Clothing Manipulation: Standing Toileting - Hygiene: Performed;Independent Where Assessed - Toileting Hygiene: Sit on 3-in-1 or toilet Ambulation Related to ADLs: Pt ambulated with min guard for safety. Pt with slow shuffled steps but reports he just feels a little weak from laying in bed the  past day. ADL Comments: Pt requires increased time due to fatigue.  Recommended spouse be with pt during showers.    OT Diagnosis:    OT Problem List:   OT Treatment Interventions:     OT Goals    Visit Information  Assistance Needed: +1 PT/OT Co-Evaluation/Treatment: Yes    Subjective Data      Prior Functioning  Home Living Lives With: Spouse Available Help at Discharge: Family;Available 24 hours/day Type of Home: Apartment Home Access: Level entry Home Layout: One level Bathroom Shower/Tub: Tub/shower unit;Curtain Firefighter: Handicapped height Bathroom Accessibility: Yes How Accessible: Accessible via walker Home Adaptive Equipment: None Prior Function Level of Independence: Independent Able to Take Stairs?: Yes Driving: No Vocation: Retired Musician: HOH Dominant Hand: Right    Cognition  Overall Cognitive Status: Appears within functional limits for tasks assessed/performed Arousal/Alertness: Awake/alert Orientation Level: Appears intact for tasks assessed Behavior During Session: [facility] for tasks performed    Extremity/Trunk Assessment Right Upper Extremity Assessment RUE ROM/Strength/Tone: Within functional levels (3+/5 elbow extension, otherwise 4/5 throughout) RUE Sensation: WFL - Light Touch;WFL - Proprioception RUE Coordination: WFL - gross/fine motor Left Upper Extremity Assessment LUE ROM/Strength/Tone: Within functional levels (4/5 throughout) LUE Sensation: WFL - Light Touch;WFL - Proprioception LUE Coordination: WFL - gross/fine motor Right Lower Extremity Assessment RLE ROM/Strength/Tone: Within functional levels RLE Sensation: WFL - Light Touch RLE Coordination: WFL - gross/fine motor Left Lower Extremity Assessment LLE ROM/Strength/Tone: Within functional levels LLE Sensation: WFL - Light Touch LLE Coordination: WFL - gross/fine motor   Mobility Bed Mobility Bed Mobility: Sitting - Scoot to Edge of Bed;Supine to  Sit Supine to Sit: 4: Min guard Sitting - Scoot to [place] of Bed: 4: Min guard Details for Bed Mobility Assistance: VC for proper sequencing. No physical assistance  needed although pt required increased time and motivation to complete. Movement not fluid Transfers Sit to Stand: 4: Min assist;With upper extremity assist;From bed;From toilet Stand to Sit: 4: Min assist;With upper extremity assist;To chair/3-in-1;To toilet Details for Transfer Assistance: VC for hand placement and safety upon standing. Assist for forward translation and stability on ascent and descent.   Exercise    Balance Balance Balance Assessed: Yes Standardized Balance Assessment Standardized Balance Assessment: Berg Balance Test;Dynamic Gait Index Dynamic Gait Index Level Surface: Mild Impairment Change in Gait Speed: Mild Impairment Gait and Pivot Turn: Normal Step Over Obstacle: Mild Impairment Step Around Obstacles: Mild Impairment  End of Session OT - End of Session Equipment Utilized During Treatment: Gait belt Activity Tolerance: Patient limited by fatigue Patient left: in bed;with call bell/phone within reach;with bed alarm set;with family/visitor present Nurse Communication: Mobility status  10/03/2011 Cipriano Mile OTR/L Pager [phone] Office [phone]  Cipriano Mile 10/03/2011, 11:22 AM

## 2011-10-03 NOTE — Progress Notes (Signed)
*  PRELIMINARY RESULTS* Vascular Ultrasound Carotid Duplex (Doppler) has been completed.  Preliminary findings: Bilaterally no significant ICA stenosis with antegrade vertebral flow.  Farrel Demark, RDMS 10/03/2011, 11:02 AM

## 2011-10-03 NOTE — Progress Notes (Signed)
Subjective: Patient seen and examined this morning. Wife and daughter at bedside. Admission H&P reviewed. Patient informs he was having difficulty understanding what was going on.  Objective:  Vital signs in last 24 hours:  Filed Vitals:   10/03/11 0450 10/03/11 0645 10/03/11 0900 10/03/11 1100  BP: 127/71 138/75 117/71 115/74  Pulse: 86 81 91 88  Temp: 98.2 F (36.8 C) 97.4 F (36.3 C) 98.6 F (37 C) 98.4 F (36.9 C)  TempSrc: Oral Oral Oral Oral  Resp: 18 18 18 18   Height:      Weight:      SpO2: 96% 93% 94% 92%    Intake/Output from previous day:   Intake/Output Summary (Last 24 hours) at 10/03/11 1325 Last data filed at 10/03/11 1300  Gross per 24 hour  Intake 818.33 ml  Output   1000 ml  Net -181.67 ml    Physical Exam:  General: elderly male  in no acute distress. HEENT: no pallor, no icterus, moist oral mucosa, no JVD, no lymphadenopathy Heart: Normal  s1 &s2  Regular rate and rhythm, without murmurs, rubs, gallops. Lungs: Clear to auscultation bilaterally. Abdomen: Soft, nontender, nondistended, positive bowel sounds. Extremities: No clubbing cyanosis or edema with positive pedal pulses. Neuro: Alert, awake, oriented x3, nonfocal.   Lab Results:  Basic Metabolic Panel:    Component Value Date/Time   NA 138 10/03/2011 0648   K 4.7 10/03/2011 0648   CL 102 10/03/2011 0648   CO2 26 10/03/2011 0648   BUN 11 10/03/2011 0648   CREATININE 0.71 10/03/2011 0648   GLUCOSE 158* 10/03/2011 0648   CALCIUM 9.6 10/03/2011 0648   CBC:    Component Value Date/Time   WBC 10.2 10/03/2011 0648   HGB 11.9* 10/03/2011 0648   HCT 35.4* 10/03/2011 0648   PLT 241 10/03/2011 0648   MCV 83.9 10/03/2011 0648   NEUTROABS 9.0* 10/02/2011 1433   LYMPHSABS 1.2 10/02/2011 1433   MONOABS 1.9* 10/02/2011 1433   EOSABS 0.1 10/02/2011 1433   BASOSABS 0.0 10/02/2011 1433    No results found for this or any previous visit (from the past 240 hour(s)).  Studies/Results: Dg Chest 2  View  10/02/2011  *RADIOLOGY REPORT*  Clinical Data: Stroke 2 weeks ago.  CHEST - 2 VIEW  Comparison: None.  Findings: Lung volumes are low but the lungs are clear.  Heart size is normal.  No pneumothorax or pleural fluid.  There are some mildly prominent loops of small and large bowel in the upper abdomen.  IMPRESSION:  1.  No acute cardiopulmonary disease. 2.  Mildly prominent gas-filled loops of small large bowel may be due to ileus. If the patient has abdominal complaints, plain films of the abdomen could be used for further evaluation.  Original Report Authenticated By: Bernadene Bell. Maricela Curet, M.D.   [name] Wo Contrast  10/02/2011  *RADIOLOGY REPORT*  Clinical Data: Weakness, worse on the left.  MRI HEAD WITHOUT AND WITH CONTRAST  Technique:  Multiplanar, multiecho pulse sequences of the brain and surrounding structures were obtained according to standard protocol without and with intravenous contrast  Contrast: 15mL MULTIHANCE GADOBENATE DIMEGLUMINE 529 MG/ML IV SOLN  Comparison: CT head without and with contrast 09/04/2011.  Findings: The diffusion weighted images demonstrates a linear area of non hemorrhagic infarction in the posterior right frontal lobe measuring 10 mm.  The lesion is less obvious on the ADC but does not represent shine through.  Remote lacunar infarcts are present on the right (85 and  bilateral basal ganglia, promptly on the right.  Extensive periventricular subcortical white matter changes are evident bilaterally, advanced for age.  Flow is present in the major intracranial arteries.  The globes orbits are intact.  Paranasal sinuses are clear.  Fluid is present in the right mastoid air cells.  No obstructing nasopharyngeal lesion is evident.  Post contrast images straight no pathologic enhancement.  IMPRESSION:  1.  Acute / subacute non hemorrhagic infarct of the posterior left frontal lobe white matter.  This could contribute to the patient's weakness, likely on the right. 2.  Atrophy  and extensive white matter disease.  This likely reflects the sequelae of chronic microvascular ischemia. 3.  Remote lacunar infarcts of the right corona radiata and bilateral basal ganglia.  Original Report Authenticated By: Jamesetta Orleans. MATTERN, M.D.    Medications: Scheduled Meds:   . acetaminophen  975 mg Oral Once  . cefTRIAXone (ROCEPHIN)  IV  1 g Intravenous Once  . clopidogrel  75 mg Oral Daily  . enoxaparin  40 mg Subcutaneous Q24H  . gadobenate dimeglumine  15 mL Intravenous Once  . insulin glargine  35 Units Subcutaneous QHS  . lisinopril  5 mg Oral Daily  . PARoxetine  40 mg Oral q morning - 10a  . piperacillin-tazobactam (ZOSYN)  IV  3.375 g Intravenous Q8H  . polyethylene glycol  17 g Oral Daily  . simvastatin  40 mg Oral QPM  . venlafaxine XR  150 mg Oral BID  . DISCONTD: piperacillin-tazobactam  3.375 g Intravenous Q8H   Continuous Infusions:   . 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 Patient had recent stroke 1 month back and now MRI shows Acute / subacute non hemorrhagic infarct of the posterior left  frontal lobe .   PLAN:  Acute to subacute CVA involving the left frontal lobe  Appreciate neuro recs  cont plavix  check A1C, lipid panel  called PCP office to fax results of recent echo and carotids done as outpt . Have not received so far. However 2 D echo and carotids ordered on admission have been completed here and will be followed  cleared for swallow Seen by PT and recommends HHPT   UTI   history of hypotonic bladder and self-catheterization - treating as complicated UTI with Zosyn. Follow  urine cultures.    Diabetes mellitus type 2  - continue home insulin and SSI   hypertension  - continue home medications.     Hyperlipidemia  continue home medications.  #6. History of depression  - cont home meds  . Anemia  - patient is scheduled to followup with Dr. Dulce Sellar as outpatient for further workup next month    LOS: 1 day   Abner Ardis 10/03/2011, 1:25 PM

## 2011-10-03 NOTE — Progress Notes (Signed)
  Echocardiogram 2D Echocardiogram has been performed.  Reginald Patterson, Real Cons 10/03/2011, 11:24 AM

## 2011-10-03 NOTE — Progress Notes (Signed)
Brief Nutrition Note: Pt identified on Nutrition Risk report for dysphagia. Chart reviewed. Spoke with wife and daughter who report that pt had no prior hx of dysphagia and passed his swallow evaluation this morning. They report no recent wt changes, good appetite PTA. Per wife pt has hgbA1C level checked every 3 months and it has been stable. No questions about DM diet at this time. No nutrition intervention at this time.  Please re-consult as needed. Kendell Bane Cornelison [phone]

## 2011-10-03 NOTE — ED Notes (Signed)
Attempted to call report to [phone], RN instructed she would call back in a few minutes.

## 2011-10-03 NOTE — Progress Notes (Signed)
Stroke Team Progress Note  HISTORY Reginald Patterson is an 71 y.o. male who awakened early at about 5AM on 10/02/2011 feeling weak. He went back to sleep and when he awakened again later he was no better and in fact per the wife was weaker. EMS was called at that time and patient was brought in for evaluation. Patient reports that about a month ago he had an episode of confusion with weakness that was felt to be secondaryto low blood sugar. Symptoms did not clear and a work up was performed as an outpatient that included an MRI of the brain. An acute infarct was diagnosed and patient had a resultant work up that included a carotid doppler and echo; reports not available on admission. Patient was started on Plavix and has been improving until today. MRI performed here in the ED shows a right posterior frontal lobe acute white matter infarct. Patient was not a TPA candidate secondary to stroke within 3 mos. He was admitted for further evaluation and treatment.  SUBJECTIVE His wife and daughter are at the bedside.  Overall they feels his condition is unchanged with ongoing generalized weakness.  OBJECTIVE Most recent Vital Signs: Filed Vitals:   10/03/11 0140 10/03/11 0450 10/03/11 0645 10/03/11 0900  BP: 127/75 127/71 138/75 117/71  Pulse: 94 86 81 91  Temp: 97.9 F (36.6 C) 98.2 F (36.8 C) 97.4 F (36.3 C) 98.6 F (37 C)  TempSrc: Oral Oral Oral Oral  Resp: 18 18 18 18   Height: 5' (1.524 m)     Weight: 76.8 kg (169 lb 5 oz)     SpO2: 97% 96% 93% 94%   CBG (last 3)   Basename 10/03/11 0115  GLUCAP 179*   Intake/Output from previous day: 05/09 0701 - 05/10 0700 In: 578.3 [P.O.:120; I.V.:408.3; IV Piggyback:50] Out: 1000 [Urine:1000]  IV Fluid Intake:     . sodium chloride 100 mL/hr at 10/03/11 0250   MEDICATIONS    . acetaminophen  975 mg Oral Once  . cefTRIAXone (ROCEPHIN)  IV  1 g Intravenous Once  . clopidogrel  75 mg Oral Daily  . enoxaparin  40 mg Subcutaneous Q24H  .  gadobenate dimeglumine  15 mL Intravenous Once  . insulin glargine  35 Units Subcutaneous QHS  . lisinopril  5 mg Oral Daily  . PARoxetine  40 mg Oral q morning - 10a  . piperacillin-tazobactam (ZOSYN)  IV  3.375 g Intravenous Q8H  . polyethylene glycol  17 g Oral Daily  . simvastatin  40 mg Oral QPM  . venlafaxine XR  150 mg Oral BID  . DISCONTD: piperacillin-tazobactam  3.375 g Intravenous Q8H   PRN:  ALPRAZolam, senna-docusate  Diet:  Carb Control thin liquids Activity:   Bathroom privileges with assistance DVT Prophylaxis:  Lovenox 40 mg sq daily   CLINICALLY SIGNIFICANT STUDIES Basic Metabolic Panel:  Lab 10/03/11 4098 10/02/11 1433  NA 138 133*  K 4.7 4.2  CL 102 97  CO2 26 26  GLUCOSE 158* 134*  BUN 11 11  CREATININE 0.71 0.76  CALCIUM 9.6 9.8  MG -- --  PHOS -- --   Liver Function Tests:  Lab 10/03/11 0648 10/02/11 1433  AST 21 17  ALT 23 17  ALKPHOS 127* 125*  BILITOT 0.3 0.3  PROT 6.7 6.8  ALBUMIN 3.0* 3.3*   CBC:  Lab 10/03/11 0648 10/02/11 1433  WBC 10.2 12.1*  NEUTROABS -- 9.0*  HGB 11.9* 12.2*  HCT 35.4* 36.5*  MCV 83.9  84.5  PLT 241 243   Coagulation: No results found for this basename: LABPROT:4,INR:4 in the last 168 hours  Cardiac Enzymes:  Lab 10/02/11 1433  CKTOTAL --  CKMB --  CKMBINDEX --  TROPONINI <0.30   Urinalysis:  Lab 10/02/11 1606  COLORURINE YELLOW  LABSPEC 1.013  PHURINE 7.0  GLUCOSEU NEGATIVE  HGBUR TRACE*  BILIRUBINUR NEGATIVE  KETONESUR NEGATIVE  PROTEINUR 30*  UROBILINOGEN 1.0  NITRITE POSITIVE*  LEUKOCYTESUR LARGE*   Lipid Panel    Component Value Date/Time   CHOL 125 10/03/2011 0648   TRIG 121 10/03/2011 0648   HDL 47 10/03/2011 0648   CHOLHDL 2.7 10/03/2011 0648   VLDL 24 10/03/2011 0648   LDLCALC 54 10/03/2011 0648   HgbA1C  No results found for this basename: HGBA1C    Urine Drug Screen:   No results found for this basename: labopia, cocainscrnur, labbenz, amphetmu, thcu, labbarb    Alcohol Level:  No results found for this basename: ETH:2 in the last 168 hours  CT of the brain    MRI of the brain  10/02/2011   1.  Acute / subacute non hemorrhagic infarct of the posterior left frontal lobe white matter.  This could contribute to the patient's weakness, likely on the right. 2.  Atrophy and extensive white matter disease.  This likely reflects the sequelae of chronic microvascular ischemia. 3.  Remote lacunar infarcts of the right corona radiata and bilateral basal ganglia.    MRA of the brain    2D Echocardiogram    Carotid Doppler 10/03/2011  No internal carotid artery stenosis bilaterally. Vertebrals with antegrade flow bilaterally.   CXR  10/02/2011   1.  No acute cardiopulmonary disease. 2.  Mildly prominent gas-filled loops of small large bowel may be due to ileus. If the patient has abdominal complaints, plain films of the abdomen could be used for further evaluation.  EKG  sinus tachycardia, baseline wander.   Therapy Recommendations PT -home health, OT -supervision, no needs, ST - none  Physical Exam   Pleasant elderly Caucasian male not in distress.Awake alert. Afebrile. Head is nontraumatic. Neck is supple without bruit. Hearing is normal. Cardiac exam no murmur or gallop. Lungs are clear to auscultation. Distal pulses are well felt.   Neurological exam : awake alert oriented x2. Speech and language appear normal though slightly nonfluent. Diminished attention, short-term memory and recall. Extraocular moments are full range without nystagmus. Visual acuity and fields seem adequate.There is mild right lower facial symmetry. Tongue is midline. Motor system exam reveals no upper or lower extremity drift however fine finger movements are diminished on the right. Orbits left over right upper extremity. No sensory loss. Coordination is accurate. Gait was not tested. ASSESSMENT Reginald Patterson is a 71 y.o. 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  On  clopidogrel 75 mg orally every day prior to admission. Now on clopidogrel 75 mg orally every day for secondary stroke prevention. Patient with resultant mild right hemiparesis.  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 -home health PT -likely ok for discharge next day or so  SHARON BIBY, AVNP, ANP-BC, GNP-BC [facility] Pager: [phone] 10/03/2011 10:38 AM  Scribe for Dr. Delia Heady, Stroke Center Medical Director. He has personally reviewed chart, pertinent data, examined the patient and  developed the plan of care. Pager:  [phone]

## 2011-10-04 DIAGNOSIS — I6340 Cerebral infarction due to embolism of unspecified cerebral artery: Secondary | ICD-10-CM

## 2011-10-04 DIAGNOSIS — E1165 Type 2 diabetes mellitus with hyperglycemia: Secondary | ICD-10-CM

## 2011-10-04 DIAGNOSIS — I10 Essential (primary) hypertension: Secondary | ICD-10-CM

## 2011-10-04 DIAGNOSIS — F32A Depression, unspecified: Secondary | ICD-10-CM

## 2011-10-04 DIAGNOSIS — D649 Anemia, unspecified: Secondary | ICD-10-CM | POA: Diagnosis present

## 2011-10-04 DIAGNOSIS — E782 Mixed hyperlipidemia: Secondary | ICD-10-CM

## 2011-10-04 DIAGNOSIS — I6330 Cerebral infarction due to thrombosis of unspecified cerebral artery: Secondary | ICD-10-CM

## 2011-10-04 DIAGNOSIS — F329 Major depressive disorder, single episode, unspecified: Secondary | ICD-10-CM

## 2011-10-04 DIAGNOSIS — R5383 Other fatigue: Secondary | ICD-10-CM

## 2011-10-04 DIAGNOSIS — R5381 Other malaise: Secondary | ICD-10-CM

## 2011-10-04 HISTORY — DX: Depression, unspecified: F32.A

## 2011-10-04 LAB — GLUCOSE, CAPILLARY
Glucose-Capillary: 124 mg/dL — ABNORMAL HIGH (ref 70–99)
Glucose-Capillary: 129 mg/dL — ABNORMAL HIGH (ref 70–99)

## 2011-10-04 MED ORDER — SULFAMETHOXAZOLE-TRIMETHOPRIM 800-160 MG PO TABS: 1.0000 | ORAL_TABLET | Freq: Two times a day (BID) | ORAL | Status: AC

## 2011-10-04 NOTE — Progress Notes (Signed)
Foley drainage bag changed to leg bag for discharge home.

## 2011-10-04 NOTE — Progress Notes (Signed)
Discharge to home, transported to car via wheelchair

## 2011-10-04 NOTE — Progress Notes (Signed)
Patient ID: Reginald Patterson, male   DOB: [birth year], 71 y.o.   MRN: [id number] Stroke Team Progress Note  HISTORY Reginald Patterson is an 71 y.o. male who awakened early at about 5AM on 10/02/2011 feeling weak. He went back to sleep and when he awakened again later he was no better and in fact per the wife was weaker. EMS was called at that time and patient was brought in for evaluation. Patient reports that about a month ago he had an episode of confusion with weakness that was felt to be secondaryto low blood sugar. Symptoms did not clear and a work up was performed as an outpatient that included an MRI of the brain. An acute infarct was diagnosed and patient had a resultant work up that included a carotid doppler and echo; reports not available on admission. Patient was started on Plavix and has been improving until today. MRI performed here in the ED shows a right posterior frontal lobe acute white matter infarct. Patient was not a TPA candidate secondary to stroke within 3 mos. He was admitted for further evaluation and treatment.  SUBJECTIVE Patient and his wife are at bedside. No complaints this am and believes he is doing better. Has been up walking with therapy.  OBJECTIVE Most recent Vital Signs: Filed Vitals:   10/03/11 1847 10/03/11 2200 10/04/11 0220 10/04/11 0610  BP: 128/78 117/76 122/72 130/74  Pulse: 95 87 79 76  Temp: 98.3 F (36.8 C) 98.7 F (37.1 C) 98.1 F (36.7 C) 98.7 F (37.1 C)  TempSrc: Oral     Resp: 18 16 16 20   Height:      Weight:      SpO2: 100% 91% 92% 94%   CBG (last 3)   Basename 10/03/11 2158 10/03/11 1649 10/03/11 1205  GLUCAP 153* 188* 136*   Intake/Output from previous day: 05/10 0701 - 05/11 0700 In: 2640 [P.O.:2640] Out: 2150 [Urine:2150]  IV Fluid Intake:      . DISCONTD: sodium chloride 75 mL/hr at 10/03/11 1832   MEDICATIONS     . clopidogrel  75 mg Oral Daily  . enoxaparin  40 mg Subcutaneous Q24H  . insulin glargine  35 Units  Subcutaneous QHS  . lisinopril  5 mg Oral Daily  . PARoxetine  40 mg Oral q morning - 10a  . piperacillin-tazobactam (ZOSYN)  IV  3.375 g Intravenous Q8H  . polyethylene glycol  17 g Oral Daily  . simvastatin  40 mg Oral QPM  . venlafaxine XR  150 mg Oral BID   PRN:  ALPRAZolam, senna-docusate  Diet:  Carb Control thin liquids Activity:   Bathroom privileges with assistance DVT Prophylaxis:  Lovenox 40 mg sq daily   CLINICALLY SIGNIFICANT STUDIES Basic Metabolic Panel:   Lab 10/03/11 0648 10/02/11 1433  NA 138 133*  K 4.7 4.2  CL 102 97  CO2 26 26  GLUCOSE 158* 134*  BUN 11 11  CREATININE 0.71 0.76  CALCIUM 9.6 9.8  MG -- --  PHOS -- --   Liver Function Tests:   Lab 10/03/11 0648 10/02/11 1433  AST 21 17  ALT 23 17  ALKPHOS 127* 125*  BILITOT 0.3 0.3  PROT 6.7 6.8  ALBUMIN 3.0* 3.3*   CBC:   Lab 10/03/11 0648 10/02/11 1433  WBC 10.2 12.1*  NEUTROABS -- 9.0*  HGB 11.9* 12.2*  HCT 35.4* 36.5*  MCV 83.9 84.5  PLT 241 243   Coagulation: No results found for this basename: LABPROT:4,INR:4 in  the last 168 hours  Cardiac Enzymes:   Lab 10/02/11 1433  CKTOTAL --  CKMB --  CKMBINDEX --  TROPONINI <0.30   Urinalysis:   Lab 10/02/11 1606  COLORURINE YELLOW  LABSPEC 1.013  PHURINE 7.0  GLUCOSEU NEGATIVE  HGBUR TRACE*  BILIRUBINUR NEGATIVE  KETONESUR NEGATIVE  PROTEINUR 30*  UROBILINOGEN 1.0  NITRITE POSITIVE*  LEUKOCYTESUR LARGE*   Lipid Panel    Component Value Date/Time   CHOL 125 10/03/2011 0648   TRIG 121 10/03/2011 0648   HDL 47 10/03/2011 0648   CHOLHDL 2.7 10/03/2011 0648   VLDL 24 10/03/2011 0648   LDLCALC 54 10/03/2011 0648   HgbA1C  Lab Results  Component Value Date   HGBA1C 7.1* 10/03/2011    Urine Drug Screen:   No results found for this basename: labopia,  cocainscrnur,  labbenz,  amphetmu,  thcu,  labbarb    Alcohol Level: No results found for this basename: ETH:2 in the last 168 hours  MRI of the brain  10/02/2011   1.  Acute /  subacute non hemorrhagic infarct of the posterior left frontal lobe white matter.  This could contribute to the patient's weakness, likely on the right. 2.  Atrophy and extensive white matter disease.  This likely reflects the sequelae of chronic microvascular ischemia. 3.  Remote lacunar infarcts of the right corona radiata and bilateral basal ganglia.    2D Echocardiogram  10/03/11 EF 50-55%, no significant abnormality noted  Carotid Doppler 10/03/2011  No internal carotid artery stenosis bilaterally. Vertebrals with antegrade flow bilaterally.   CXR  10/02/2011   1.  No acute cardiopulmonary disease. 2.  Mildly prominent gas-filled loops of small large bowel may be due to ileus. If the patient has abdominal complaints, plain films of the abdomen could be used for further evaluation.  EKG  sinus tachycardia, baseline wander.   Therapy Recommendations PT -home health, OT -supervision, no needs, ST - none  Physical Exam   Pleasant elderly male not in distress. Awake alert. Afebrile. Head is nontraumatic. Neck is supple without bruit. Hearing is normal. Cardiac exam no murmur or gallop. Lungs are clear to auscultation. Distal pulses are well felt.   Neurological exam : awake alert oriented x3. Speech and language appear normal though slightly nonfluent. Diminished attention, short-term memory and recall. Extraocular moments are full range without nystagmus. Visual acuity and fields seem adequate.There is mild right lower facial symmetry. Tongue is midline. Motor system exam reveals no upper or lower extremity drift however fine finger movements are diminished on the right. Orbits left over right upper extremity. No sensory loss. Coordination is accurate. Gait was not tested.  ASSESSMENT Reginald Patterson is a 71 y.o. 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  On clopidogrel 75 mg orally every day prior to admission. Now on clopidogrel 75 mg orally every day for  secondary stroke prevention. Patient with resultant mild right hemiparesis.  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 -patient is already set up for home health PT -will be discharged today per hospitalist  Kipp Laurence, MD [facility] Pager: [phone] 10/04/2011 10:13 AM

## 2011-10-04 NOTE — Progress Notes (Signed)
Physical Therapy Treatment Patient Details Name: Reginald Patterson MRN: [id number] DOB: 1940/06/10 Today's Date: 10/04/2011 Time: 4098-1191 PT Time Calculation (min): 27 min  PT Assessment / Plan / Recommendation Comments on Treatment Session  Pt's wife present during Rx and appears supportive.  Pt to d/c today by MD with f/u HHPT.  Wife states she feels comfortable assisting pt at home and providing min-min guard assist.  Pt continues with instablity with ambulation but did not require assist to recover balance.    Follow Up Recommendations  Home health PT;Supervision/Assistance - 24 hour    Barriers to Discharge        Equipment Recommendations  None recommended by OT;None recommended by PT    Recommendations for Other Services    Frequency Min 4X/week   Plan Discharge plan remains appropriate;Frequency remains appropriate    Precautions / Restrictions Precautions Precautions: Fall Restrictions Weight Bearing Restrictions: No        Mobility  Bed Mobility Bed Mobility: Sit to Supine;Supine to Sit;Sitting - Scoot to Edge of Bed Supine to Sit: 5: Supervision;With rails;HOB flat Sitting - Scoot to Edge of Bed: 5: Supervision Sit to Supine: 5: Supervision;With rail;HOB flat Details for Bed Mobility Assistance: No physical assist needed. Transfers Transfers: Sit to Stand;Stand to Sit Sit to Stand: 4: Min assist;With upper extremity assist;From bed Stand to Sit: 4: Min assist;With upper extremity assist;To bed Details for Transfer Assistance: Tends to lean posteriorly. Ambulation/Gait Ambulation/Gait Assistance: 4: Min assist;4: Min guard Ambulation Distance (Feet): 140 Feet Assistive device: None Ambulation/Gait Assistance Details: Gait slow and needs cues to increase speed.  Stability improved with increased speed.  Stiff posture. Gait Pattern: Step-to pattern;Shuffle;Decreased trunk rotation;Narrow base of support Gait velocity: Slow gait speed Stairs: No Wheelchair  Mobility Wheelchair Mobility: No        PT Goals Acute Rehab PT Goals Time For Goal Achievement: 10/10/11 Potential to Achieve Goals: Good PT Goal: Supine/Side to Sit - Progress: Progressing toward goal PT Goal: Sit to Supine/Side - Progress: Progressing toward goal PT Goal: Sit to Stand - Progress: Progressing toward goal PT Goal: Stand to Sit - Progress: Progressing toward goal PT Transfer Goal: Bed to Chair/Chair to Bed - Progress: Progressing toward goal PT Goal: Ambulate - Progress: Progressing toward goal Additional Goals PT Goal: Additional Goal #1 - Progress: Progressing toward goal  Visit Information  Last PT Received On: 10/04/11 Assistance Needed: +1    Subjective Data  Subjective: "I've been like this before."   Cognition  Overall Cognitive Status: Appears within functional limits for tasks assessed/performed Arousal/Alertness: Awake/alert Orientation Level: Appears intact for tasks assessed Behavior During Session: [facility] for tasks performed    Balance  Dynamic Gait Index Level Surface: Mild Impairment Change in Gait Speed: Mild Impairment Gait with Horizontal Head Turns: Mild Impairment Gait with Vertical Head Turns: Mild Impairment  End of Session PT - End of Session Equipment Utilized During Treatment: Gait belt Activity Tolerance: Patient tolerated treatment well Patient left: in bed;with call bell/phone within reach;with family/visitor present    Newell Coral 10/04/2011, 10:16 AM  Newell Coral, PTA Acute Rehab [phone] (office)  [phone] pager

## 2011-10-04 NOTE — Discharge Instructions (Signed)
STROKE/TIA DISCHARGE INSTRUCTIONS SMOKING Cigarette smoking nearly doubles your risk of having a stroke & is the single most alterable risk factor  If you smoke or have smoked in the last 12 months, you are advised to quit smoking for your health.  Most of the excess cardiovascular risk related to smoking disappears within a year of stopping.  Ask you doctor about anti-smoking medications  [place] Quit Line: 1-800-QUIT NOW  Free Smoking Cessation Classes ([phone]  CHOLESTEROL Know your levels; limit fat & cholesterol in your diet  Lipid Panel  No results found for this basename: chol, trig, hdl, cholhdl, vldl, ldlcalc      Many patients benefit from treatment even if their cholesterol is at goal.  Goal: Total Cholesterol (CHOL) less than 160  Goal:  Triglycerides (TRIG) less than 150  Goal:  HDL greater than 40  Goal:  LDL (LDLCALC) less than 100   BLOOD PRESSURE American Stroke Association blood pressure target is less that 120/80 mm/Hg  Your discharge blood pressure is:  BP: 138/75 mmHg  Monitor your blood pressure  Limit your salt and alcohol intake  Many individuals will require more than one medication for high blood pressure  DIABETES (A1c is a blood sugar average for last 3 months) Goal HGBA1c is under 7% (HBGA1c is blood sugar average for last 3 months)  Diabetes: {STROKE DC DIABETES:22357}    No results found for this basename: HGBA1C     Your HGBA1c can be lowered with medications, healthy diet, and exercise.  Check your blood sugar as directed by your physician  Call your physician if you experience unexplained or low blood sugars.  PHYSICAL ACTIVITY/REHABILITATION Goal is 30 minutes at least 4 days per week    {STROKE DC ACTIVITY/REHAB:22359}  Activity decreases your risk of heart attack and stroke and makes your heart stronger.  It helps control your weight and blood pressure; helps you relax and can improve your mood.  Participate in a regular exercise  program.  Talk with your doctor about the best form of exercise for you (dancing, walking, swimming, cycling).  DIET/WEIGHT Goal is to maintain a healthy weight  Your discharge diet is: Carb Control *** liquids Your height is:  Height: 5' (152.4 cm) Your current weight is: Weight: 76.8 kg (169 lb 5 oz) Your Body Mass Index (BMI) is:  BMI (Calculated): 33.1   Following the type of diet specifically designed for you will help prevent another stroke.  Your goal weight range is:  ***  Your goal Body Mass Index (BMI) is 19-24.  Healthy food habits can help reduce 3 risk factors for stroke:  High cholesterol, hypertension, and excess weight.  RESOURCES Stroke/Support Group:  Call [phone]  they meet the 3rd Sunday of the month on the Rehab Unit at [facility], [place] ( no meetings June, July & Aug).  STROKE EDUCATION PROVIDED/REVIEWED AND GIVEN TO PATIENT Stroke warning signs and symptoms How to activate emergency medical system (call 911). Medications prescribed at discharge. Need for follow-up after discharge. Personal risk factors for stroke. Pneumonia vaccine given:   {STROKE DC YES/NO/DATE:22363} Flu vaccine given:   {STROKE DC YES/NO/DATE:22363} My questions have been answered, the writing is legible, and I understand these instructions.  I will adhere to these goals & educational materials that have bStroke (Cerebrovascular Accident) A stroke (cerebrovascular accident, CVA) means you have a brain injury from blocked circulation or bleeding in the brain. Blocked circulation usually comes from a clot. RISK FACTORS  High blood  pressure (hypertension).   High cholesterol.   Diabetes.   Heart disease.   The buildup of fatty deposits in the blood vessels (peripheral artery disease or atherosclerosis).   An abnormal heart rhythm (atrial fibrillation).   Obesity.   Smoking.   Taking oral contraceptives (especially in combination with smoking).   Physical inactivity.   A diet  high in fats, salt (sodium), and calories.   Alcohol use.   Use of illegal drugs (especially cocaine and methamphetamine).   Being a male.   Being an Tree surgeon.   Age over 11.   Family history of stroke.   Previous history of blood clots, a "warning stroke" (transient ischemic attack, TIA), or heart attack.   Sickle cell disease.  SYMPTOMS  The symptoms of a stroke depend on the part of the brain that is affected. It is important to seek treatment within 4 hours of the start of symptoms because you may receive a "clot dissolving" medication that cannot be given after that time. Even if you don't know when your symptoms began, get treatment as soon as possible. Symptoms of a stroke may progress or change over the first several days. Symptoms may include:  Sudden weakness or numbness of the face, arm, or leg, especially on one side of the body.   Sudden confusion.   Trouble speaking (aphasia) or understanding.   Sudden trouble seeing in one or both eyes.   Sudden trouble walking.   Dizziness.   Loss of balance or coordination.   Sudden severe headache with no known cause.  HOME CARE INSTRUCTIONS   Medicines: Aspirin and blood thinners may be used to prevent another stroke. Blood thinners need to be used exactly as instructed. Medicines may also be used to control risk factors for a stroke. Be sure you understand all your medicine instructions.   Diet: Certain diets may be prescribed to address high blood pressure, high cholesterol, diabetes, or obesity. A diet that includes 5 or more servings of fruits and vegetables a day may reduce the risk of stroke. Foods may need to be a special consistency (soft or pureed), or small bites may need to be taken in order to avoid aspirating or choking.   Maintain a healthy weight.   Stay physically active. It is recommended that you get at least 30 minutes of activity on most or all days.   Do not smoke.   Limit alcohol use.     Stop drug abuse.   Home safety: A safe home environment is important to reduce the risk of falls. Your caregiver may arrange for specialists to evaluate your home. Having grab bars in the bedroom and bathroom is often important. Your caregiver may arrange for special equipment to be used at home, such as raised toilets and a seat for the shower.   Physical, occupational, and speech therapy: Ongoing therapy may be needed to maximize your recovery after a stroke. If you have been advised to use a walker or a cane, use it at all times. Be sure to keep your therapy appointments.   Follow all instructions for follow-up with your caregiver. This is VERY important. This includes any referrals, physical therapy, rehabilitation, and laboratory tests. Proper treatment also prevents another stroke from occurring.  SEEK IMMEDIATE MEDICAL CARE IF:   You have sudden weakness or numbness of the face, arm, or leg, especially on one side of the body.   You have sudden confusion.   You have trouble speaking (aphasia) or understanding.  You have sudden trouble seeing in one or both eyes.   You have sudden trouble walking.   You have dizziness.   You have loss of balance or coordination.   You have a sudden, severe headache with no known cause.   You have a fever.   You are coughing or have difficulty breathing.   You have new chest pain, angina, or an irregular heartbeat.  Any of these symptoms may represent a serious problem that is an emergency. Do not wait to see if the symptoms will go away. Get medical help at once. Call your local emergency services (911 in [place] Do not drive yourself to the hospital. Document Released: 05/12/2005 Document Revised: 11/25/2010 Document Reviewed: 10/10/2009 Exit[employer] Patient Information 2012 [employer]een provided to me after my discharge from the hospital.

## 2011-10-04 NOTE — Discharge Summary (Addendum)
Patient ID: BURAK ZERBE MRN: [id number] DOB/AGE: 1940/09/23 71 y.o.  Admit date: 10/02/2011 Discharge date: 10/04/2011  Primary Care Physician:  Mickie Hillier, MD, MD  Discharge Diagnoses:     Principal Problem:  *CVA (cerebral infarction)  Active Problems:  UTI (lower urinary tract infection)  HTN (hypertension)  Hyperlipidemia  Diabetes mellitus, type 2 Anemia   Medication List  As of 10/04/2011  9:28 AM   TAKE these medications         ALPRAZolam 1 MG tablet   Commonly known as: XANAX   Take 0.5-1 mg by mouth 2 (two) times daily as needed. For anxiety      clopidogrel 75 MG tablet   Commonly known as: PLAVIX   Take 75 mg by mouth daily.      insulin glargine 100 UNIT/ML injection   Commonly known as: LANTUS   Inject 35 Units into the skin at bedtime.      lisinopril 5 MG tablet   Commonly known as: PRINIVIL,ZESTRIL   Take 5 mg by mouth daily.      metFORMIN 850 MG tablet   Commonly known as: GLUCOPHAGE   Take 850 mg by mouth 2 (two) times daily with a meal.      PARoxetine 40 MG tablet   Commonly known as: PAXIL   Take 40 mg by mouth every morning.      polyethylene glycol packet   Commonly known as: MIRALAX / GLYCOLAX   Take 17 g by mouth daily.      simvastatin 40 MG tablet   Commonly known as: ZOCOR   Take 40 mg by mouth every evening.      sulfamethoxazole-trimethoprim 800-160 MG per tablet   Commonly known as: BACTRIM DS,SEPTRA DS   Take 1 tablet by mouth 2 (two) times daily.      venlafaxine XR 150 MG 24 hr capsule   Commonly known as: EFFEXOR-XR   Take 150 mg by mouth 2 (two) times daily.            Disposition and Follow-up: Home with PT and 24 hr supervision Follow up with PCP in 1 week and Dr Pearlean Brownie in 2 months.  patient has appt with GI next month  Consults:  sethi ( neurology)  Significant Diagnostic Studies:  Dg Chest 2 View  10/02/2011  *RADIOLOGY REPORT*  Clinical Data: Stroke 2 weeks ago.  CHEST - 2 VIEW   Comparison: None.  Findings: Lung volumes are low but the lungs are clear.  Heart size is normal.  No pneumothorax or pleural fluid.  There are some mildly prominent loops of small and large bowel in the upper abdomen.  IMPRESSION:  1.  No acute cardiopulmonary disease. 2.  Mildly prominent gas-filled loops of small large bowel may be due to ileus. If the patient has abdominal complaints, plain films of the abdomen could be used for further evaluation.  Original Report Authenticated By: Bernadene Bell. Maricela Curet, M.D.   [name] Wo Contrast  10/02/2011  *RADIOLOGY REPORT*  Clinical Data: Weakness, worse on the left.  MRI HEAD WITHOUT AND WITH CONTRAST  Technique:  Multiplanar, multiecho pulse sequences of the brain and surrounding structures were obtained according to standard protocol without and with intravenous contrast  Contrast: 15mL MULTIHANCE GADOBENATE DIMEGLUMINE 529 MG/ML IV SOLN  Comparison: CT head without and with contrast 09/04/2011.  Findings: The diffusion weighted images demonstrates a linear area of non hemorrhagic infarction in the posterior right frontal lobe measuring 10 mm.  The lesion is less obvious on the ADC but does not represent shine through.  Remote lacunar infarcts are present on the right (85 and bilateral basal ganglia, promptly on the right.  Extensive periventricular subcortical white matter changes are evident bilaterally, advanced for age.  Flow is present in the major intracranial arteries.  The globes orbits are intact.  Paranasal sinuses are clear.  Fluid is present in the right mastoid air cells.  No obstructing nasopharyngeal lesion is evident.  Post contrast images straight no pathologic enhancement.  IMPRESSION:  1.  Acute / subacute non hemorrhagic infarct of the posterior left frontal lobe white matter.  This could contribute to the patient's weakness, likely on the right. 2.  Atrophy and extensive white matter disease.  This likely reflects the sequelae of chronic  microvascular ischemia. 3.  Remote lacunar infarcts of the right corona radiata and bilateral basal ganglia.  Original Report Authenticated By: Jamesetta Orleans. MATTERN, M.D.    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 Patient's wife states since breaking up in the morning the patient has been feeling weak he had something at 5 AM and went back to sleep. Which was unusual. At around 11 AM patient was waken up for breakfast after which he still felt sleepy so they called her PCPs office and patient was instructed to come to ER. In the ER MRI of the brain shows acute/subacute infarct and patient has been admitted for further workup. As per wife patient had similar symptoms a month ago and they had called EMS. EMS felt the patient may have had an hypoglycemic episode. And the following week patient had followed with PCP and had a CT head was done which showed infarct and also had carotid Doppler, MRA of the brain and 2-D echo.Patient otherwise denies any shortness of breath, nausea vomiting, palpitations, chest pain, abdominal pain diarrhea or dysuria.    Physical Exam on Discharge:  Filed Vitals:   10/03/11 1847 10/03/11 2200 10/04/11 0220 10/04/11 0610  BP: 128/78 117/76 122/72 130/74  Pulse: 95 87 79 76  Temp: 98.3 F (36.8 C) 98.7 F (37.1 C) 98.1 F (36.7 C) 98.7 F (37.1 C)  TempSrc: Oral     Resp: 18 16 16 20   Height:      Weight:      SpO2: 100% 91% 92% 94%     Intake/Output Summary (Last 24 hours) at 10/04/11 1610 Last data filed at 10/04/11 0600  Gross per 24 hour  Intake   2640 ml  Output   2150 ml  Net    490 ml    General: elderly male in no acute distress.  HEENT: no pallor, no icterus, moist oral mucosa, no JVD, no lymphadenopathy  Heart: Normal s1  &s2 Regular rate and rhythm, without murmurs, rubs, gallops.  Lungs: Clear to auscultation bilaterally.  Abdomen: Soft, nontender, nondistended, positive bowel sounds. On foley Extremities: No clubbing cyanosis or edema with positive pedal pulses.  Neuro: Alert, awake, oriented x3, nonfocal. Has some confusion trying to understand the plan of treatment  CBC:    Component Value Date/Time   WBC 10.2 10/03/2011 0648   HGB 11.9* 10/03/2011 0648   HCT 35.4* 10/03/2011 0648   PLT 241 10/03/2011 0648   MCV 83.9 10/03/2011 0648   NEUTROABS  9.0* 10/02/2011 1433   LYMPHSABS 1.2 10/02/2011 1433   MONOABS 1.9* 10/02/2011 1433   EOSABS 0.1 10/02/2011 1433   BASOSABS 0.0 10/02/2011 1433    Basic Metabolic Panel:    Component Value Date/Time   NA 138 10/03/2011 0648   K 4.7 10/03/2011 0648   CL 102 10/03/2011 0648   CO2 26 10/03/2011 0648   BUN 11 10/03/2011 0648   CREATININE 0.71 10/03/2011 0648   GLUCOSE 158* 10/03/2011 0648   CALCIUM 9.6 10/03/2011 0648    Hospital Course:  Acute to subacute CVA involving the left frontal lobe  Appreciate neuro recs  cont plavix and secondary stroke prevention A1C, lipid panel stable. Continue with statin called PCP office to fax results of recent echo and carotids done as outpt but did not receive them. patient had 2D echo and carotid doppler repeated here which were both unremarkable cleared for swallow  Patient showed significant improvement in symptoms Seen by PT and recommends HHPT with 24 hr supervision  UTI  history of hypotonic bladder and self-catheterization . He is now on a foley as he has not been able  to straight cath himself following recent stroke. He can continue on foley and get back to straight cath once he is more oriented and capable of doing so himself.  - treating as complicated UTI with Zosyn.pending urine cultures. Will be discharged on a total 10 days of bactrim. Urine culture and sensitivity to be followed as outpt.  Diabetes mellitus type 2    - continue home insulin and SSI . A1 C of 7.1  hypertension / hyperlipidemia -stable - continue home medications.    Anemia  - patient is scheduled to followup with Dr. Dulce Sellar as outpatient for further workup next month    Plan discussed with patient and his wife in detail Patient clinically stable for discharge home with outpatient follow up.  Time spent on Discharge: 45 minutes  Signed: Eddie North 10/04/2011, 9:28 AM

## 2011-10-05 LAB — TSH: TSH: 1.685 u[IU]/mL (ref 0.350–4.500)

## 2011-10-05 NOTE — ED Provider Notes (Signed)
Pt with weakness.  Dx stroke.  Will admit   Medical screening examination/treatment/procedure(s) were conducted as a shared visit with non-physician practitioner(s) and myself.  I personally evaluated the patient during the encounter   Benny Lennert, MD 10/05/11 [phone]

## 2011-10-06 LAB — URINE CULTURE
Colony Count: 10000
Culture  Setup Time: 201305102125

## 2011-10-06 NOTE — Care Management Note (Signed)
    Page 1 of 1   10/06/2011     8:39:28 AM   CARE MANAGEMENT NOTE 10/06/2011  Patient:  Reginald Patterson, Reginald Patterson   Account Number:  [id number]  Date Initiated:  10/03/2011  Documentation initiated by:  [facility]  Subjective/Objective Assessment:   Admitted with CVA. Lives with spouse.     Action/Plan:   PT eval-recommending HHPT  OT eval-no d/c needs   Anticipated DC Date:  10/06/2011   Anticipated DC Plan:  HOME W HOME HEALTH SERVICES      DC Planning Services  CM consult      Choice offered to / List presented to:  C-1 Patient        HH arranged  HH-2 PT      [facility] agency  [facility]   Status of service:  Completed, signed off Medicare Important Message given?   (If response is "NO", the following Medicare IM given date fields will be blank) Date Medicare IM given:   Date Additional Medicare IM given:    Discharge Disposition:  HOME W HOME HEALTH SERVICES  Per UR Regulation:  Reviewed for med. necessity/level of care/duration of stay  If discussed at Long Length of Stay Meetings, dates discussed:    Comments:  PCP Dr. Catha Gosselin  10/03/11 Spoke with patient and wife about HHPT. They chose [facility] from the [facility] agencies list. Contacted Debbie at [facility] and requested HHPT. Will follow for further d/c needs. Jacquelynn Cree RN, BSN, CCM

## 2012-07-06 ENCOUNTER — Encounter: Payer: Self-pay | Admitting: Neurology

## 2012-09-21 ENCOUNTER — Other Ambulatory Visit: Payer: Self-pay | Admitting: Gastroenterology

## 2012-09-21 DIAGNOSIS — Z1211 Encounter for screening for malignant neoplasm of colon: Secondary | ICD-10-CM

## 2012-10-05 ENCOUNTER — Other Ambulatory Visit: Payer: Self-pay | Admitting: Gastroenterology

## 2012-10-05 ENCOUNTER — Ambulatory Visit
Admission: RE | Admit: 2012-10-05 | Discharge: 2012-10-05 | Disposition: A | Discharge status: Home / Self Care | Payer: Medicare Other | Source: Ambulatory Visit | Attending: Gastroenterology | Admitting: Gastroenterology

## 2012-10-05 DIAGNOSIS — Z1211 Encounter for screening for malignant neoplasm of colon: Secondary | ICD-10-CM

## 2012-10-20 ENCOUNTER — Telehealth: Payer: Self-pay | Admitting: Neurology

## 2012-11-05 ENCOUNTER — Encounter (HOSPITAL_COMMUNITY): Payer: Self-pay | Admitting: Pharmacy Technician

## 2012-11-08 ENCOUNTER — Encounter (HOSPITAL_COMMUNITY): Payer: Self-pay | Admitting: *Deleted

## 2012-11-08 DIAGNOSIS — D649 Anemia, unspecified: Secondary | ICD-10-CM

## 2012-11-08 DIAGNOSIS — I639 Cerebral infarction, unspecified: Secondary | ICD-10-CM

## 2012-11-08 HISTORY — DX: Cerebral infarction, unspecified: I63.9

## 2012-11-08 HISTORY — DX: Anemia, unspecified: D64.9

## 2012-11-23 ENCOUNTER — Other Ambulatory Visit: Payer: Self-pay | Admitting: Gastroenterology

## 2012-11-23 NOTE — Addendum Note (Signed)
Addended by: Willis Modena on: 11/23/2012 08:44 PM   Modules accepted: Orders

## 2012-11-24 ENCOUNTER — Encounter (HOSPITAL_COMMUNITY): Payer: Self-pay | Admitting: Anesthesiology

## 2012-11-24 ENCOUNTER — Encounter (HOSPITAL_COMMUNITY)
Admission: RE | Disposition: A | Discharge status: Home / Self Care | Payer: Self-pay | Source: Ambulatory Visit | Attending: Gastroenterology

## 2012-11-24 ENCOUNTER — Ambulatory Visit (HOSPITAL_COMMUNITY)
Admission: RE | Admit: 2012-11-24 | Discharge: 2012-11-24 | Disposition: A | Discharge status: Home / Self Care | Payer: Medicare Other | Source: Ambulatory Visit | Attending: Gastroenterology | Admitting: Gastroenterology

## 2012-11-24 HISTORY — DX: Anemia, unspecified: D64.9

## 2012-11-24 SURGERY — COLONOSCOPY WITH PROPOFOL
Anesthesia: Monitor Anesthesia Care

## 2012-11-24 MED ORDER — SODIUM CHLORIDE 0.9 % IV SOLN: INTRAVENOUS | Status: DC

## 2012-11-24 SURGICAL SUPPLY — 21 items

## 2012-11-24 NOTE — Anesthesia Preprocedure Evaluation (Deleted)
Anesthesia Evaluation  Patient identified by MRN, date of birth, ID band Patient awake    Reviewed: Allergy & Precautions, H&P , NPO status , Patient's Chart, lab work & pertinent test results  Airway       Dental  (+) Dental Advisory Given   Pulmonary neg pulmonary ROS,          Cardiovascular hypertension, Pt. on medications     Neuro/Psych PSYCHIATRIC DISORDERS Anxiety Depression CVA, No Residual Symptoms negative neurological ROS     GI/Hepatic negative GI ROS, Neg liver ROS,   Endo/Other  diabetes, Type 2  Renal/GU negative Renal ROS  negative genitourinary   Musculoskeletal negative musculoskeletal ROS (+)   Abdominal   Peds negative pediatric ROS (+)  Hematology negative hematology ROS (+)   Anesthesia Other Findings   Reproductive/Obstetrics negative OB ROS                           Anesthesia Physical Anesthesia Plan  ASA: III  Anesthesia Plan: MAC   Post-op Pain Management:    Induction: Intravenous  Airway Management Planned:   Additional Equipment:   Intra-op Plan:   Post-operative Plan:   Informed Consent: I have reviewed the patients History and Physical, chart, labs and discussed the procedure including the risks, benefits and alternatives for the proposed anesthesia with the patient or authorized representative who has indicated his/her understanding and acceptance.   Dental advisory given  Plan Discussed with: CRNA  Anesthesia Plan Comments:         Anesthesia Quick Evaluation

## 2012-12-09 ENCOUNTER — Ambulatory Visit: Payer: Self-pay | Admitting: Nurse Practitioner

## 2012-12-15 ENCOUNTER — Encounter (HOSPITAL_COMMUNITY): Payer: Self-pay | Admitting: *Deleted

## 2012-12-20 ENCOUNTER — Encounter (HOSPITAL_COMMUNITY): Payer: Self-pay | Admitting: Pharmacy Technician

## 2013-01-11 ENCOUNTER — Other Ambulatory Visit: Payer: Self-pay | Admitting: Gastroenterology

## 2013-01-12 ENCOUNTER — Encounter (HOSPITAL_COMMUNITY): Payer: Self-pay | Admitting: Certified Registered Nurse Anesthetist

## 2013-01-12 ENCOUNTER — Ambulatory Visit (HOSPITAL_COMMUNITY)
Admission: RE | Admit: 2013-01-12 | Discharge: 2013-01-12 | Disposition: A | Discharge status: Home / Self Care | Payer: Medicare Other | Source: Ambulatory Visit | Attending: Gastroenterology | Admitting: Gastroenterology

## 2013-01-12 ENCOUNTER — Ambulatory Visit (HOSPITAL_COMMUNITY): Payer: Medicare Other | Admitting: Certified Registered Nurse Anesthetist

## 2013-01-12 ENCOUNTER — Encounter (HOSPITAL_COMMUNITY)
Admission: RE | Disposition: A | Discharge status: Home / Self Care | Payer: Self-pay | Source: Ambulatory Visit | Attending: Gastroenterology

## 2013-01-12 DIAGNOSIS — D649 Anemia, unspecified: Secondary | ICD-10-CM | POA: Insufficient documentation

## 2013-01-12 DIAGNOSIS — I10 Essential (primary) hypertension: Secondary | ICD-10-CM | POA: Insufficient documentation

## 2013-01-12 DIAGNOSIS — K5730 Diverticulosis of large intestine without perforation or abscess without bleeding: Secondary | ICD-10-CM | POA: Insufficient documentation

## 2013-01-12 DIAGNOSIS — E119 Type 2 diabetes mellitus without complications: Secondary | ICD-10-CM | POA: Insufficient documentation

## 2013-01-12 DIAGNOSIS — R933 Abnormal findings on diagnostic imaging of other parts of digestive tract: Secondary | ICD-10-CM | POA: Insufficient documentation

## 2013-01-12 DIAGNOSIS — D126 Benign neoplasm of colon, unspecified: Secondary | ICD-10-CM | POA: Insufficient documentation

## 2013-01-12 HISTORY — DX: Chronic kidney disease, unspecified: N18.9

## 2013-01-12 HISTORY — PX: COLONOSCOPY WITH PROPOFOL: SHX5780

## 2013-01-12 SURGERY — COLONOSCOPY WITH PROPOFOL
Anesthesia: Monitor Anesthesia Care

## 2013-01-12 MED ORDER — MIDAZOLAM HCL 5 MG/5ML IJ SOLN
INTRAMUSCULAR | Status: DC | PRN
  Administered 2013-01-12 (×2): 1 mg via INTRAVENOUS

## 2013-01-12 MED ORDER — PROPOFOL INFUSION 10 MG/ML OPTIME
INTRAVENOUS | Status: DC | PRN
  Administered 2013-01-12: 140 ug/kg/min via INTRAVENOUS

## 2013-01-12 MED ORDER — SODIUM CHLORIDE 0.9 % IV SOLN: INTRAVENOUS | Status: DC

## 2013-01-12 MED ORDER — KETAMINE HCL 10 MG/ML IJ SOLN
INTRAMUSCULAR | Status: DC | PRN
  Administered 2013-01-12 (×3): 10 mg via INTRAVENOUS

## 2013-01-12 MED ORDER — LACTATED RINGERS IV SOLN
INTRAVENOUS | Status: DC
  Administered 2013-01-12: 1000 mL via INTRAVENOUS

## 2013-01-12 MED ORDER — CLOPIDOGREL BISULFATE 75 MG PO TABS: 75.0000 mg | ORAL_TABLET | Freq: Every evening | ORAL | Status: DC

## 2013-01-12 SURGICAL SUPPLY — 21 items

## 2013-01-12 NOTE — Anesthesia Preprocedure Evaluation (Addendum)
Anesthesia Evaluation  Patient identified by MRN, date of birth, ID band Patient awake    Reviewed: Allergy & Precautions, H&P , NPO status , Patient's Chart, lab work & pertinent test results  Airway Mallampati: II TM Distance: >3 FB Neck ROM: Full    Dental no notable dental hx. (+) Upper Dentures and Lower Dentures   Pulmonary neg pulmonary ROS,  breath sounds clear to auscultation  Pulmonary exam normal       Cardiovascular hypertension, Pt. on medications Rhythm:Regular Rate:Normal     Neuro/Psych PSYCHIATRIC DISORDERS Anxiety Depression CVA, No Residual Symptoms    GI/Hepatic negative GI ROS, Neg liver ROS,   Endo/Other  diabetes  Renal/GU negative Renal ROS  negative genitourinary   Musculoskeletal negative musculoskeletal ROS (+)   Abdominal   Peds negative pediatric ROS (+)  Hematology negative hematology ROS (+)   Anesthesia Other Findings   Reproductive/Obstetrics negative OB ROS                          Anesthesia Physical Anesthesia Plan  ASA: III  Anesthesia Plan: MAC   Post-op Pain Management:    Induction: Intravenous  Airway Management Planned: Simple Face Mask  Additional Equipment:   Intra-op Plan:   Post-operative Plan:   Informed Consent: I have reviewed the patients History and Physical, chart, labs and discussed the procedure including the risks, benefits and alternatives for the proposed anesthesia with the patient or authorized representative who has indicated his/her understanding and acceptance.   Dental advisory given  Plan Discussed with: CRNA  Anesthesia Plan Comments:         Anesthesia Quick Evaluation                                   Anesthesia Evaluation  Patient identified by MRN, date of birth, ID band Patient awake    Reviewed: Allergy & Precautions, H&P , NPO status , Patient's Chart, lab work & pertinent test  results  Airway       Dental  (+) Dental Advisory Given   Pulmonary neg pulmonary ROS,          Cardiovascular hypertension, Pt. on medications     Neuro/Psych PSYCHIATRIC DISORDERS Anxiety Depression CVA, No Residual Symptoms negative neurological ROS     GI/Hepatic negative GI ROS, Neg liver ROS,   Endo/Other  diabetes, Type 2  Renal/GU negative Renal ROS  negative genitourinary   Musculoskeletal negative musculoskeletal ROS (+)   Abdominal   Peds negative pediatric ROS (+)  Hematology negative hematology ROS (+)   Anesthesia Other Findings   Reproductive/Obstetrics negative OB ROS                           Anesthesia Physical Anesthesia Plan  ASA: III  Anesthesia Plan: MAC   Post-op Pain Management:    Induction: Intravenous  Airway Management Planned:   Additional Equipment:   Intra-op Plan:   Post-operative Plan:   Informed Consent: I have reviewed the patients History and Physical, chart, labs and discussed the procedure including the risks, benefits and alternatives for the proposed anesthesia with the patient or authorized representative who has indicated his/her understanding and acceptance.   Dental advisory given  Plan Discussed with: CRNA  Anesthesia Plan Comments:         Anesthesia Quick Evaluation

## 2013-01-12 NOTE — Addendum Note (Signed)
Addended by: Willis Modena on: 01/12/2013 08:16 AM   Modules accepted: Orders

## 2013-01-12 NOTE — Op Note (Signed)
[facility] [address] [place], [address]   COLONOSCOPY PROCEDURE REPORT  PATIENT: Reginald Patterson, Reginald Patterson  MR#: [id number] BIRTHDATE: 1941/04/20 , 71  yrs. old GENDER: Male ENDOSCOPIST: Willis Modena, MD REFERRED OZ:[id number] Little, M.D. PROCEDURE DATE:  01/12/2013 PROCEDURE:   Colonoscopy with biopsy and snare polypectomy ASA CLASS:   Class III INDICATIONS:anemia, abnormal barium enema. MEDICATIONS: MAC sedation, administered by CRNA  DESCRIPTION OF PROCEDURE:   After the risks benefits and alternatives of the procedure were thoroughly explained, informed consent was obtained.  A digital rectal exam revealed no abnormalities of the rectum.   The Pentax Ped Colon [id number] endoscope was introduced through the anus and advanced to the cecum, which was identified by both the appendix and ileocecal valve. No adverse events experienced.   The quality of the prep was good.  The instrument was then slowly withdrawn as the colon was fully examined.    Findings:  Prep quality good.  Scattered proximal colonic diverticula.  4mm ascending colon polyp, removed with cold forceps. 15mm ascending colon polyp, removed with snare cautery.  25mm transverse colon polyp, removed with snare cautery.  No other polyps, masses, vascular ectasias, or inflammatory changes seen. Normal retroflexed view of rectum.       Withdrawal time was about 15 minutes .  The scope was withdrawn and the procedure completed.  ENDOSCOPIC IMPRESSION:     As above.  Finding on barium enema likely from the large transverse colon polyp.  RECOMMENDATIONS:     1.  Watch for potential complications of procedure. 2.  Await polypectomy results. 3.  Resume clopidigrel in 3 days (01/15/13). 4.  Pending patient's clinical status, would consider repeat colonoscopy in 3 years. 5.  Follow-up with [facility] on as-needed basis.  eSigned:  Willis Modena, MD 01/12/2013 11:20 AM   cc:   PATIENT NAME:  Reginald Patterson, Reginald Patterson MR#: [id number]

## 2013-01-12 NOTE — Anesthesia Postprocedure Evaluation (Signed)
  Anesthesia Post-op Note  Patient: Reginald Patterson  Procedure(s) Performed: Procedure(s) (LRB): COLONOSCOPY WITH PROPOFOL (N/A)  Patient Location: PACU  Anesthesia Type: MAC  Level of Consciousness: awake and alert   Airway and Oxygen Therapy: Patient Spontanous Breathing  Post-op Pain: mild  Post-op Assessment: Post-op Vital signs reviewed, Patient's Cardiovascular Status Stable, Respiratory Function Stable, Patent Airway and No signs of Nausea or vomiting  Last Vitals:  Filed Vitals:   01/12/13 1153  BP: 157/90  Temp:   Resp: 18    Post-op Vital Signs: stable   Complications: No apparent anesthesia complications

## 2013-01-12 NOTE — H&P (Signed)
Patient interval history reviewed.  Patient examined again.  There has been no change from documented H/P dated 01/06/13 (scanned into chart from our office) except as documented above.  Assessment:  1.  Anemia. 2.  Abnormal barium enema, ? Large polyp in proximal transverse colon.  Plan:  1.  Colonoscopy. 2.  Risks (bleeding, infection, bowel perforation that could require surgery, sedation-related changes in cardiopulmonary systems), benefits (identification and possible treatment of source of symptoms, exclusion of certain causes of symptoms), and alternatives (watchful waiting, radiographic imaging studies, empiric medical treatment) of colonoscopy were explained to patient/family in detail and patient wishes to proceed.

## 2013-01-12 NOTE — Transfer of Care (Signed)
Immediate Anesthesia Transfer of Care Note  Patient: Reginald Patterson  Procedure(s) Performed: Procedure(s): COLONOSCOPY WITH PROPOFOL (N/A)  Patient Location: PACU  Anesthesia Type:MAC  Level of Consciousness: awake and alert   Airway & Oxygen Therapy: Patient Spontanous Breathing and Patient connected to face mask oxygen  Post-op Assessment: Report given to PACU RN and Post -op Vital signs reviewed and stable  Post vital signs: Reviewed and stable  Complications: No apparent anesthesia complications

## 2013-01-13 ENCOUNTER — Encounter (HOSPITAL_COMMUNITY): Payer: Self-pay | Admitting: Gastroenterology

## 2013-03-17 ENCOUNTER — Ambulatory Visit (INDEPENDENT_AMBULATORY_CARE_PROVIDER_SITE_OTHER): Payer: Medicare Other | Admitting: Nurse Practitioner

## 2013-03-17 ENCOUNTER — Encounter: Payer: Self-pay | Admitting: Nurse Practitioner

## 2013-03-17 ENCOUNTER — Encounter (INDEPENDENT_AMBULATORY_CARE_PROVIDER_SITE_OTHER): Payer: Self-pay

## 2013-03-17 VITALS — BP 109/70 | HR 90 | Temp 98.2°F | Ht 66.5 in | Wt 179.0 lb

## 2013-03-17 DIAGNOSIS — I6350 Cerebral infarction due to unspecified occlusion or stenosis of unspecified cerebral artery: Secondary | ICD-10-CM

## 2013-03-17 MED ORDER — CLOPIDOGREL BISULFATE 75 MG PO TABS: 75.0000 mg | ORAL_TABLET | Freq: Every evening | ORAL | Status: DC

## 2013-03-17 NOTE — Progress Notes (Signed)
[facility]  PATIENT: Reginald Patterson DOB: Aug 11, 1940   REASON FOR VISIT: follow up HISTORY FROM: patient  HISTORY OF PRESENT ILLNESS: Reginald Patterson is an 72 y.o. 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   Vascular risk factors include hypertension, hyperlipidemia and diabetes mellitus.  Doing well, no new neurovascular symptoms.  He returns for followup after last visit on 03/02/12. He continues to do well without any recurrent neurovascular symptoms. He is tolerating Plavix without bleeding, bruising or other side effects. His blood pressure is well controlled and it is 110/73 in office today. He states he had lipid profile checked by Dr.Little but I do not have those results. He has no new complaints today.  UPDATE 03/17/13 (LL):  Reginald Patterson comes in for stroke followup, last office visit was 06/14/12.  BP is well controlled, is 109/70 in office, they report that all lab work was good at last PCP visit.  Tolerating Plavix well, with only reports of mild bruising.  His last carotid doppler checked on 07/13/12 was negative for hemodynamically significant stenosis.  He has no complaints.  Review of Systems  Out of a complete 14 system review, the patient complains of only the following symptoms, and all other reviewed systems are negative.  Hematology/Lymphatic: easy bruising   ALLERGIES: Allergies  Allergen Reactions  . Ciprofloxacin Rash  . Vicodin [Hydrocodone-Acetaminophen] Rash    HOME MEDICATIONS: Outpatient Prescriptions Prior to Visit  Medication Sig Dispense Refill  . ALPRAZolam (XANAX) 1 MG tablet Take 0.5 mg by mouth 2 (two) times daily as needed for anxiety.       . clopidogrel (PLAVIX) 75 MG tablet Take 1 tablet (75 mg total) by mouth every evening.      Marland Kitchen CRANBERRY PO Take 300 mg by mouth daily as needed (for cranberry).       Marland Kitchen ibuprofen  (ADVIL,MOTRIN) 200 MG tablet Take 200 mg by mouth every 6 (six) hours as needed for pain.      . Insulin Glargine (LANTUS SOLOSTAR) 100 UNIT/ML SOPN Inject 35 Units into the skin daily.      Marland Kitchen lisinopril (PRINIVIL,ZESTRIL) 5 MG tablet Take 5 mg by mouth every morning.      . metFORMIN (GLUCOPHAGE) 850 MG tablet Take 850 mg by mouth 2 (two) times daily with a meal.      . Multiple Vitamin (MULTIVITAMIN WITH MINERALS) TABS Take 1 tablet by mouth daily.      Marland Kitchen PARoxetine (PAXIL) 40 MG tablet Take 40 mg by mouth at bedtime.       . polyethylene glycol (MIRALAX / GLYCOLAX) packet Take 17 g by mouth daily as needed (for constipation).       . simvastatin (ZOCOR) 40 MG tablet Take 40 mg by mouth every evening.      . venlafaxine XR (EFFEXOR-XR) 150 MG 24 hr capsule Take 150 mg by mouth 2 (two) times daily.       No facility-administered medications prior to visit.    PAST MEDICAL HISTORY: Past Medical History  Diagnosis Date  . Diabetes mellitus   . Hypertension   . Hyperlipidemia   . Anxiety   . Anemia 11-08-12    mild anemia  . Stroke 11-08-12    x2 , last 09-29-11(confusion,left side weakness) no residual"some memory problems.  . Chronic kidney disease     must self cath    PAST SURGICAL  HISTORY: Past Surgical History  Procedure Laterality Date  . Back surgery      1996- metal plate and screws  . Colonoscopy with propofol N/A 01/12/2013    Procedure: COLONOSCOPY WITH PROPOFOL;  Surgeon: Willis Modena, MD;  Location: [facility];  Service: Endoscopy;  Laterality: N/A;    FAMILY HISTORY: Family History  Problem Relation Age of Onset  . Dementia Mother   . Lung cancer Father     SOCIAL HISTORY: History   Social History  . Marital Status: Married    Spouse Name: N/A    Number of Children: N/A  . Years of Education: N/A   Occupational History  . Not on file.   Social History Main Topics  . Smoking status: Former Games developer  . Smokeless tobacco: Former Neurosurgeon    Types: Chew    . Alcohol Use: Yes     Comment: only rarely  . Drug Use: No  . Sexual Activity: Not Currently   Other Topics Concern  . Not on file   Social History Narrative  . No narrative on file     PHYSICAL EXAM  Filed Vitals:   03/17/13 1058  BP: 109/70  Pulse: 90  Temp: 98.2 F (36.8 C)  TempSrc: Oral  Height: 5' 6.5" (1.689 m)  Weight: 179 lb (81.194 kg)   Body mass index is 28.46 kg/(m^2).  General: Pleasant, in no distress.  Afebrile.   Head: nontraumatic Ears, Nose and Throat: Hearing is decreased. Neck: supple without bruit Respiratory: clear to auscultation Cardiovascular: no murmur or gallop Skin: Few ecchymosis on right hand  Neurologic Exam  Mental Status: Awake, alert and oriented to time, place and person.  Speech and language appear normal.   Cranial Nerves: Eye movements are full range without nystagmus.  Fundi  not visualized.  Visual fields are full to confrontational testing.  Face is symmetric without weakness.  Tongue is midline. Patient is heard of hearing bilaterally. Motor: reveals no upper or lower extremity drift.  Symmetric and equal strength in all four extremities.  No focal weakness.    Sensory: Touch and pinprick sensations are normal.   Coordination: normal Gait and Station: steady gait with moderate difficulty with tandem walking.   Reflexes: Deep tendon reflexes are 2+ symmetric.  Plantars are downgoing.    DIAGNOSTIC DATA (LABS, IMAGING, TESTING) - I reviewed patient records, labs, notes, testing and imaging myself where available.  07/13/12 CAROTID DOPPLERS - negative.  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   Vascular risk factors include hypertension, hyperlipidemia and diabetes mellitus.  Doing well, no new neurovascular symptoms.  Plan:  Continue clopidrogel 75 mg daily for secondary stroke prevention. Rx refilled. Maintain strict  control of risk factors: Hypertension goal blood pressure below 120/70, hyperlipidemia goal LDLs below 70 mg percent and total cholesterol below 180, goal hemoglobin A1c below 6.5%.   Healthy diet and light exercise such as walking at least 3 times per week for 30 minutes, stay hydrated.   Return for followup in 6 months.  Ronal Fear, MSN, NP-C 03/17/2013, 11:00 AM [facility] [address] [place], [place] [address] [phone]

## 2013-03-17 NOTE — Patient Instructions (Signed)
Plan:  Continue clopidrogel 75 mg daily for secondary stroke prevention. Maintain strict control of risk factors: Hypertension goal blood pressure below 120/70, hyperlipidemia goal LDLs below 70 mg percent and total cholesterol below 180, goal hemoglobin A1c below 6.5%.   Healthy diet and light exercise such as walking at least 3 times per week for 30 minutes, stay hydrated.   Return for followup in 6 months.

## 2013-09-15 ENCOUNTER — Ambulatory Visit: Payer: Medicare Other | Admitting: Nurse Practitioner

## 2013-09-15 ENCOUNTER — Telehealth: Payer: Self-pay | Admitting: Nurse Practitioner

## 2013-09-15 NOTE — Telephone Encounter (Signed)
Patient was no show for today's office appointment.  

## 2013-11-02 ENCOUNTER — Other Ambulatory Visit: Payer: Self-pay | Admitting: Nurse Practitioner

## 2013-12-13 ENCOUNTER — Ambulatory Visit (INDEPENDENT_AMBULATORY_CARE_PROVIDER_SITE_OTHER): Payer: Medicare Other | Admitting: Nurse Practitioner

## 2013-12-13 ENCOUNTER — Encounter (INDEPENDENT_AMBULATORY_CARE_PROVIDER_SITE_OTHER): Payer: Self-pay

## 2013-12-13 ENCOUNTER — Encounter: Payer: Self-pay | Admitting: Nurse Practitioner

## 2013-12-13 VITALS — BP 107/65 | HR 81 | Temp 97.0°F | Ht 67.0 in | Wt 181.5 lb

## 2013-12-13 DIAGNOSIS — I6350 Cerebral infarction due to unspecified occlusion or stenosis of unspecified cerebral artery: Secondary | ICD-10-CM

## 2013-12-13 MED ORDER — CLOPIDOGREL BISULFATE 75 MG PO TABS: 75.0000 mg | ORAL_TABLET | Freq: Every day | ORAL | Status: DC

## 2013-12-13 NOTE — Patient Instructions (Addendum)
Continue clopidrogel 75 mg daily for secondary stroke prevention. Rx refilled. Maintain strict control of risk factors: Hypertension goal blood pressure below 120/70, hyperlipidemia goal LDLs below 70 mg percent and total cholesterol below 180, goal hemoglobin A1c below 6.5%.  Healthy diet and light exercise such as walking at least 3 times per week for 30 minutes, stay hydrated.  Return for followup in 6 months with Dr. Leonie Man, sooner as needed.  Stroke Prevention Some medical conditions and behaviors are associated with an increased chance of having a stroke. You may prevent a stroke by making healthy choices and managing medical conditions. HOW CAN I REDUCE MY RISK OF HAVING A STROKE?   Stay physically active. Get at least 30 minutes of activity on most or all days.  Do not smoke. It may also be helpful to avoid exposure to secondhand smoke.  Limit alcohol use. Moderate alcohol use is considered to be:  No more than 2 drinks per day for men.  No more than 1 drink per day for nonpregnant women.  Eat healthy foods. This involves  Eating 5 or more servings of fruits and vegetables a day.  Following a diet that addresses high blood pressure (hypertension), high cholesterol, diabetes, or obesity.  Manage your cholesterol levels.  A diet low in saturated fat, trans fat, and cholesterol and high in fiber may control cholesterol levels.  Take any prescribed medicines to control cholesterol as directed by your health care provider.  Manage your diabetes.  A controlled-carbohydrate, controlled-sugar diet is recommended to manage diabetes.  Take any prescribed medicines to control diabetes as directed by your health care provider.  Control your hypertension.  A low-salt (sodium), low-saturated fat, low-trans fat, and low-cholesterol diet is recommended to manage hypertension.  Take any prescribed medicines to control hypertension as directed by your health care provider.  Maintain a  healthy weight.  A reduced-calorie, low-sodium, low-saturated fat, low-trans fat, low-cholesterol diet is recommended to manage weight.  Stop drug abuse.  Avoid taking birth control pills.  Talk to your health care provider about the risks of taking birth control pills if you are over 49 years old, smoke, get migraines, or have ever had a blood clot.  Get evaluated for sleep disorders (sleep apnea).  Talk to your health care provider about getting a sleep evaluation if you snore a lot or have excessive sleepiness.  Take medicines as directed by your health care provider.  For some people, aspirin or blood thinners (anticoagulants) are helpful in reducing the risk of forming abnormal blood clots that can lead to stroke. If you have the irregular heart rhythm of atrial fibrillation, you should be on a blood thinner unless there is a good reason you cannot take them.  Understand all your medicine instructions.  Make sure that other other conditions (such as anemia or atherosclerosis) are addressed. SEEK IMMEDIATE MEDICAL CARE IF:   You have sudden weakness or numbness of the face, arm, or leg, especially on one side of the body.  Your face or eyelid droops to one side.  You have sudden confusion.  You have trouble speaking (aphasia) or understanding.  You have sudden trouble seeing in one or both eyes.  You have sudden trouble walking.  You have dizziness.  You have a loss of balance or coordination.  You have a sudden, severe headache with no known cause.  You have new chest pain or an irregular heartbeat. Any of these symptoms may represent a serious problem that is an emergency.  Do not wait to see if the symptoms will go away. Get medical help at once. Call your local emergency services  (911 in [place] Do not drive yourself to the hospital. Document Released: 06/19/2004 Document Revised: 03/02/2013 Document Reviewed: 11/12/2012 [facility] Patient Information 2015 [place],  [place]. This information is not intended to replace advice given to you by your health care provider. Make sure you discuss any questions you have with your health care provider.

## 2013-12-13 NOTE — Progress Notes (Signed)
I agree with the above plan 

## 2013-12-13 NOTE — Progress Notes (Signed)
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 Vascular risk factors include hypertension, hyperlipidemia and diabetes mellitus.   Reginald Patterson comes in for stroke followup, last office visit was 03/17/13 with me. BP is well controlled, is 107/65 in office, they report that all lab work was good at last visit, I do not have these results. Tolerating Plavix well, with only reports of mild bruising. His last carotid doppler checked on 07/13/12 was negative for hemodynamically significant stenosis. Doing well, no new neurovascular symptoms.   Review of Systems  Out of a complete 14 system review, the patient complains of only the following symptoms, and all other reviewed systems are negative. easy bruising, ringing in ears, and back pain.   ALLERGIES: Allergies  Allergen Reactions  . Ciprofloxacin Rash  . Vicodin [Hydrocodone-Acetaminophen] Rash    HOME MEDICATIONS: Outpatient Prescriptions Prior to Visit  Medication Sig Dispense Refill  . ALPRAZolam (XANAX) 1 MG tablet Take 0.5 mg by mouth 2 (two) times daily as needed for anxiety.       Marland Kitchen ibuprofen (ADVIL,MOTRIN) 200 MG tablet Take 200 mg by mouth every 6 (six) hours as needed for pain.      . Insulin Glargine (LANTUS SOLOSTAR) 100 UNIT/ML SOPN Inject 35 Units into the skin daily.      Marland Kitchen lisinopril (PRINIVIL,ZESTRIL) 5 MG tablet Take 5 mg by mouth every morning.      . metFORMIN (GLUCOPHAGE) 850 MG tablet Take 850 mg by mouth 2 (two) times daily with a meal.      . Multiple Vitamin (MULTIVITAMIN WITH MINERALS) TABS Take 1 tablet by mouth daily.      . ONE TOUCH ULTRA TEST test strip       . PARoxetine (PAXIL) 40 MG tablet Take 40 mg by mouth at bedtime.       . polyethylene glycol (MIRALAX /  GLYCOLAX) packet Take 17 g by mouth daily as needed (for constipation).       . simvastatin (ZOCOR) 40 MG tablet Take 40 mg by mouth every evening.      . venlafaxine XR (EFFEXOR-XR) 150 MG 24 hr capsule Take 150 mg by mouth 2 (two) times daily.      . clopidogrel (PLAVIX) 75 MG tablet TAKE ONE TABLET BY MOUTH IN THE EVENING  90 tablet  0  . CRANBERRY PO Take 300 mg by mouth daily as needed (for cranberry).        No facility-administered medications prior to visit.    PHYSICAL EXAM Filed Vitals:   12/13/13 0950  BP: 107/65  Pulse: 81  Temp: 97 F (36.1 C)  TempSrc: Oral  Height: 5\' 7"  (1.702 m)  Weight: 181 lb 8 oz (82.328 kg)   Body mass index is 28.42 kg/(m^2).  General: Pleasant, in no distress. Afebrile.  Head: nontraumatic  Ears, Nose and Throat: Hearing is decreased.  Neck: supple without bruit  Respiratory: clear to auscultation  Cardiovascular: no murmur or gallop  Skin: Few ecchymosis on right hand   Neurologic Exam  Mental Status: Awake, alert and oriented to time, place and person. Speech and language appear normal.  Cranial Nerves: Eye movements are full range without nystagmus. Fundi not visualized. Visual fields are full to confrontational testing. Face is symmetric without weakness.  Tongue is midline. Patient is heard of hearing bilaterally.  Motor: reveals no upper or lower extremity drift. Symmetric and equal strength in all four extremities. No focal weakness.  Sensory: Touch and pinprick sensations are normal.  Coordination: normal  Gait and Station: steady gait with moderate difficulty with tandem walking.  Reflexes: Deep tendon reflexes are 2+ symmetric. Plantars are downgoing.   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 Vascular risk factors include hypertension, hyperlipidemia and diabetes mellitus. Doing well, no new neurovascular symptoms.   Plan: Continue  clopidrogel 75 mg daily for secondary stroke prevention. Rx refilled. Maintain strict control of risk factors: Hypertension goal blood pressure below 140/90, hyperlipidemia goal LDLs below 70 mg percent and total cholesterol below 180, goal hemoglobin A1c below 6.5%. Healthy diet and light exercise such as walking at least 3 times per week for 30 minutes, stay hydrated.  Return for followup in 1 year with Dr. Leonie Man, sooner as needed.  Meds ordered this encounter  Medications  . clopidogrel (PLAVIX) 75 MG tablet    Sig: Take 1 tablet (75 mg total) by mouth daily.    Dispense:  90 tablet    Refill:  3    Order Specific Question:  Supervising Provider    Answer:  Antony Contras [[id number]]   Return in about 1 year (around 12/14/2014) for stroke revisit.  Rudi Rummage Anshika Pethtel, MSN, FNP-BC, A/GNP-C 12/13/2013, 10:31 AM [facility] [address], [place], [place] [address] [phone]  Note: This document was prepared with digital dictation and possible smart phrase technology. Any transcriptional errors that result from this process are unintentional.

## 2014-04-12 ENCOUNTER — Encounter: Payer: Self-pay | Admitting: Neurology

## 2014-04-18 ENCOUNTER — Encounter: Payer: Self-pay | Admitting: Neurology

## 2014-06-08 ENCOUNTER — Encounter (HOSPITAL_COMMUNITY): Payer: Self-pay | Admitting: Gastroenterology

## 2014-09-22 ENCOUNTER — Ambulatory Visit
Admission: RE | Admit: 2014-09-22 | Discharge: 2014-09-22 | Disposition: A | Discharge status: Home / Self Care | Payer: Self-pay | Source: Ambulatory Visit | Attending: Gastroenterology | Admitting: Gastroenterology

## 2014-09-22 ENCOUNTER — Other Ambulatory Visit: Payer: Self-pay | Admitting: Gastroenterology

## 2014-09-22 DIAGNOSIS — R197 Diarrhea, unspecified: Secondary | ICD-10-CM

## 2014-09-25 ENCOUNTER — Ambulatory Visit
Admission: RE | Admit: 2014-09-25 | Discharge: 2014-09-25 | Disposition: A | Discharge status: Home / Self Care | Payer: Medicare Other | Source: Ambulatory Visit | Attending: Gastroenterology | Admitting: Gastroenterology

## 2014-09-25 ENCOUNTER — Other Ambulatory Visit: Payer: Self-pay | Admitting: Gastroenterology

## 2014-09-25 DIAGNOSIS — R197 Diarrhea, unspecified: Secondary | ICD-10-CM

## 2014-09-25 MED ORDER — IOHEXOL 300 MG/ML  SOLN
100.0000 mL | Freq: Once | INTRAMUSCULAR | Status: AC | PRN
  Administered 2014-09-25: 100 mL via INTRAVENOUS

## 2014-11-24 DIAGNOSIS — M545 Low back pain: Secondary | ICD-10-CM

## 2014-11-24 DIAGNOSIS — G8929 Other chronic pain: Secondary | ICD-10-CM | POA: Insufficient documentation

## 2014-12-14 ENCOUNTER — Encounter: Payer: Self-pay | Admitting: Neurology

## 2014-12-14 ENCOUNTER — Ambulatory Visit (INDEPENDENT_AMBULATORY_CARE_PROVIDER_SITE_OTHER): Payer: Medicare Other | Admitting: Neurology

## 2014-12-14 VITALS — BP 119/68 | HR 84 | Ht 67.0 in | Wt 185.0 lb

## 2014-12-14 DIAGNOSIS — R413 Other amnesia: Secondary | ICD-10-CM | POA: Diagnosis not present

## 2014-12-14 DIAGNOSIS — I6529 Occlusion and stenosis of unspecified carotid artery: Secondary | ICD-10-CM | POA: Diagnosis not present

## 2014-12-14 NOTE — Progress Notes (Signed)
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 Vascular risk factors include hypertension, hyperlipidemia and diabetes mellitus.   Mr. Alcorn comes in for stroke followup, last office visit was 03/17/13 with me. BP is well controlled, is 107/65 in office, they report that all lab work was good at last visit, I do not have these results. Tolerating Plavix well, with only reports of mild bruising. His last carotid doppler checked on 07/13/12 was negative for hemodynamically significant stenosis. Doing well, no new neurovascular symptoms.  Update 12/14/2014 : He returns for follow-up after last visit 1 year ago. Continues to do well from neurovascular standpoint without recurrent stroke or TIA symptoms. He remains on Plavix which is tolerating well without bleeding or bruising. States his blood pressure is under good control and today it is 119/68. He states his last lipid profile checked by her primary physician was fine and this was in April. He and his wife are concerned about new complaints of agitation, very well change, mild memory difficulties and occasional confusions and verbal outbursts. Patient denies feeling depressed or having any mood problems in the past. He has not had any lab work or evaluation for this. Review of Systems  Out of a complete 14 system review, the patient complains of only the following symptoms, and all other reviewed systems are negative. Memory loss, confusion, behavioral problem, agitation, daytime sleepiness  ALLERGIES: Allergies  Allergen Reactions  . Ciprofloxacin Rash  . Vicodin [Hydrocodone-Acetaminophen] Rash    HOME MEDICATIONS: Outpatient Prescriptions Prior to Visit  Medication Sig Dispense Refill  . ALPRAZolam  (XANAX) 1 MG tablet Take 0.5 mg by mouth 2 (two) times daily as needed for anxiety.     . clopidogrel (PLAVIX) 75 MG tablet Take 1 tablet (75 mg total) by mouth daily. 90 tablet 3  . ibuprofen (ADVIL,MOTRIN) 200 MG tablet Take 200 mg by mouth every 6 (six) hours as needed for pain.    . Insulin Glargine (LANTUS SOLOSTAR) 100 UNIT/ML SOPN Inject 35 Units into the skin daily.    Marland Kitchen lisinopril (PRINIVIL,ZESTRIL) 5 MG tablet Take 5 mg by mouth every morning.    . Multiple Vitamin (MULTIVITAMIN WITH MINERALS) TABS Take 1 tablet by mouth daily.    . ONE TOUCH ULTRA TEST test strip     . polyethylene glycol (MIRALAX / GLYCOLAX) packet Take 17 g by mouth daily as needed (for constipation).     . simvastatin (ZOCOR) 40 MG tablet Take 40 mg by mouth every evening.    . venlafaxine XR (EFFEXOR-XR) 150 MG 24 hr capsule Take 150 mg by mouth 2 (two) times daily.    Marland Kitchen PARoxetine (PAXIL) 40 MG tablet Take 40 mg by mouth at bedtime.     . metFORMIN (GLUCOPHAGE) 850 MG tablet Take 1,000 mg by mouth 2 (two) times daily with a meal.      No facility-administered medications prior to visit.    PHYSICAL EXAM Filed Vitals:   12/14/14 1031  BP: 119/68  Pulse: 84  Height: 5\' 7"  (1.702 m)  Weight: 185 lb (83.915 kg)   Body mass index is 28.97 kg/(m^2).  General: Pleasant elderly Caucasian male, in no distress. Afebrile.  Head: nontraumatic  Ears, Nose and Throat: Hearing is decreased.  Neck: supple without bruit  Respiratory: clear to auscultation  Cardiovascular: no murmur or gallop  Skin: Few ecchymosis on right hand   Neurologic Exam  Mental Status: Awake, alert and oriented to time, place and person. Speech and language appear normal. Mini-Mental status exam scored 30/30. Animal naming test 10. Clock drawing 4/4. Geriatric depression scale 6 suggestive of mild depression only. Cranial Nerves: Eye movements are full range without nystagmus. Fundi not visualized. Visual fields are full to confrontational  testing. Face is symmetric without weakness. Tongue is midline. Patient is heard of hearing bilaterally.  Motor: reveals no upper or lower extremity drift. Symmetric and equal strength in all four extremities. No focal weakness.  Sensory: Touch and pinprick sensations are normal.  Coordination: normal  Gait and Station: steady gait with moderate difficulty with tandem walking.  Reflexes: Deep tendon reflexes are 2+ symmetric. Plantars are downgoing.   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 Vascular risk factors include hypertension, hyperlipidemia and diabetes mellitus. Doing well, no new neurovascular symptoms. New complaints of memory and behavioral changes possibly mild cognitive impairment versus depression  Plan:I had a long d/w patient and his wife about his remote stroke,, new memory and behavioral problems risk for recurrent stroke/TIAs, personally independently reviewed imaging studies and stroke evaluation results and answered questions.Continue Plavix  for secondary stroke prevention and maintain strict control of hypertension with blood pressure goal below 130/90, diabetes with hemoglobin A1c goal below 6.5% and lipids with LDL cholesterol goal below 100 mg/dL. I also advised the patient to eat a healthy diet with plenty of whole grains, cereals, fruits and vegetables, exercise regularly and maintain ideal body weight. Check follow-up carotid ultrasound study and lab work, EEG and MRI for treatable causes of memory loss and cognitive impairment. I advised him to follow-up with his primary physician for treatment for his mild depression Followup in the future with me in 3 months or call earlier if necessary                             Return in about 2 months (around 02/14/2015).  Antony Contras, MD  12/14/2014, 12:34 PM [facility] [address] [place], [place] [address] [phone]  Note: This document was prepared with digital dictation and possible smart phrase technology. Any transcriptional errors that result from this process are unintentional.

## 2014-12-14 NOTE — Patient Instructions (Addendum)
I had a long d/w patient and his wife about his remote stroke,, new memory and behavioral problems risk for recurrent stroke/TIAs, personally independently reviewed imaging studies and stroke evaluation results and answered questions.Continue Plavix  for secondary stroke prevention and maintain strict control of hypertension with blood pressure goal below 130/90, diabetes with hemoglobin A1c goal below 6.5% and lipids with LDL cholesterol goal below 100 mg/dL. I also advised the patient to eat a healthy diet with plenty of whole grains, cereals, fruits and vegetables, exercise regularly and maintain ideal body weight. Check follow-up carotid ultrasound study and lab work, EEG and MRI for treatable causes of memory loss and cognitive impairment. I advised him to follow-up with his primary physician for treatment for his mild depression Followup in the future with me in 3 months or call earlier if necessary  Memory Compensation Strategies  1. Use "WARM" strategy.  W= write it down  A= associate it  R= repeat it  M= make a mental note  2.   You can keep a Social worker.  Use a 3-ring notebook with sections for the following: calendar, important names and phone numbers,  medications, doctors' names/phone numbers, lists/reminders, and a section to journal what you did  each day.   3.    Use a calendar to write appointments down.  4.    Write yourself a schedule for the day.  This can be placed on the calendar or in a separate section of the Memory Notebook.  Keeping a  regular schedule can help memory.  5.    Use medication organizer with sections for each day or morning/evening pills.  You may need help loading it  6.    Keep a basket, or pegboard by the door.  Place items that you need to take out with you in the basket or on the pegboard.  You may also want to  include a message board for reminders.  7.    Use sticky notes.  Place sticky notes with reminders in a place where the task is  performed.  For example: " turn off the  stove" placed by the stove, "lock the door" placed on the door at eye level, " take your medications" on  the bathroom mirror or by the place where you normally take your medications.  8.    Use alarms/timers.  Use while cooking to remind yourself to check on food or as a reminder to take your medicine, or as a  reminder to make a call, or as a reminder to perform another task, etc.

## 2014-12-15 ENCOUNTER — Telehealth: Payer: Self-pay

## 2014-12-15 LAB — DEMENTIA PANEL
HOMOCYSTEINE: 9.8 umol/L (ref 0.0–15.0)
RPR Ser Ql: NONREACTIVE
TSH: 2.29 u[IU]/mL (ref 0.450–4.500)
VITAMIN B 12: 394 pg/mL (ref 211–946)

## 2014-12-15 NOTE — Telephone Encounter (Signed)
LVM to inform the patient that vitamin B12, TSH and blood test for syphilis all normal. He has been instructed to call office back

## 2014-12-19 NOTE — Telephone Encounter (Signed)
Informed patient that vitamin B12, TSH, RPR and homocysteine are all normal. He verbalized understanding

## 2014-12-21 ENCOUNTER — Ambulatory Visit (INDEPENDENT_AMBULATORY_CARE_PROVIDER_SITE_OTHER): Payer: Medicare Other

## 2014-12-21 ENCOUNTER — Telehealth: Payer: Self-pay

## 2014-12-21 DIAGNOSIS — I6529 Occlusion and stenosis of unspecified carotid artery: Secondary | ICD-10-CM | POA: Diagnosis not present

## 2014-12-21 NOTE — Telephone Encounter (Signed)
Calling pt to inform him of normal labs results... Ok to to tell patient when he returns call (lab work for reversible causes of memory loss-vitamin B12, homocysteine, TSH and syphilis blood test were all normal0

## 2014-12-22 ENCOUNTER — Ambulatory Visit (INDEPENDENT_AMBULATORY_CARE_PROVIDER_SITE_OTHER): Payer: Medicare Other | Admitting: Neurology

## 2014-12-22 DIAGNOSIS — R410 Disorientation, unspecified: Secondary | ICD-10-CM

## 2014-12-22 DIAGNOSIS — R413 Other amnesia: Secondary | ICD-10-CM | POA: Diagnosis not present

## 2014-12-22 NOTE — Procedures (Signed)
    History:  Reginald Patterson is a 74 year old gentleman with a history of a right posterior frontal stroke. He has developed some issues with memory, and agitation. The patient is being evaluated for this issue.  This is a routine EEG. No skull defects are noted. Medications include alprazolam, Plavix, iron supplementation, insulin, lisinopril, metformin, multivitamins, Protonix, Paxil, MiraLAX, Zocor, and Effexor.   EEG classification: Normal awake and drowsy  Description of the recording: The background rhythms of this recording consists of a fairly well modulated medium amplitude alpha rhythm of 9 Hz that is reactive to eye opening and closure. As the record progresses, the patient appears to remain in the waking state throughout the recording. Photic stimulation  and hyperventilation were not performed . Toward the end of the recording, the patient enters the drowsy state with slight symmetric slowing seen. The patient never enters stage II sleep. At no time during the recording does there appear to be evidence of spike or spike wave discharges or evidence of focal slowing. EKG monitor shows no evidence of cardiac rhythm abnormalities with a heart rate of 78.  Impression: This is a normal EEG recording in the waking and drowsy state. No evidence of ictal or interictal discharges are seen.

## 2014-12-25 ENCOUNTER — Telehealth: Payer: Self-pay

## 2014-12-25 NOTE — Progress Notes (Signed)
Preliminary Carotid Duplex Study Final result can be found under Media Tab   Physician Impression: This study is negative for hemodynamically significant stenosis involving extracranial carotid arteries bilaterally.  Acoustic shadow / Homogenous plaques were seen and mild wall thickening in the right bulb.

## 2014-12-25 NOTE — Telephone Encounter (Signed)
Informed patients wife that Reginald Patterson's EEG was normal.  She verbalized understanding.

## 2014-12-26 ENCOUNTER — Ambulatory Visit
Admission: RE | Admit: 2014-12-26 | Discharge: 2014-12-26 | Disposition: A | Discharge status: Home / Self Care | Payer: Medicare Other | Source: Ambulatory Visit | Attending: Neurology | Admitting: Neurology

## 2014-12-26 DIAGNOSIS — R413 Other amnesia: Secondary | ICD-10-CM | POA: Diagnosis not present

## 2014-12-26 MED ORDER — GADOBENATE DIMEGLUMINE 529 MG/ML IV SOLN
15.0000 mL | Freq: Once | INTRAVENOUS | Status: AC | PRN
  Administered 2014-12-26: 15 mL via INTRAVENOUS

## 2014-12-27 ENCOUNTER — Telehealth: Payer: Self-pay

## 2014-12-27 NOTE — Telephone Encounter (Signed)
Informed patient of Normal EEG results, he requesting MRI results.

## 2014-12-28 NOTE — Telephone Encounter (Signed)
LVM for patient to call office to receive MRI results.  Ok to  inform the patient that brain MRI scan shows mild age-related changes, shrinkage of the brain and hardening of the arteries. Nothing to worry about.

## 2015-01-31 ENCOUNTER — Encounter: Payer: Self-pay | Admitting: Neurology

## 2015-02-12 ENCOUNTER — Encounter: Payer: Self-pay | Admitting: Neurology

## 2015-02-15 ENCOUNTER — Other Ambulatory Visit: Payer: Self-pay

## 2015-02-15 ENCOUNTER — Telehealth: Payer: Self-pay

## 2015-02-15 MED ORDER — CLOPIDOGREL BISULFATE 75 MG PO TABS: 75.0000 mg | ORAL_TABLET | Freq: Every day | ORAL | Status: DC

## 2015-02-15 NOTE — Telephone Encounter (Signed)
Refill sent to  [facility] on [address] for plavix refill.

## 2015-02-15 NOTE — Telephone Encounter (Signed)
Patients wife sent two non urgent emails about her husbands driving and behavior issues. Rn explain to Reginald Patterson pts wife that Dr. Leonie Man does rounds at the hospital every other week. Reginald Patterson stated the concerns can be address at her husband next appt on 02-20-15 with Dr.Sethi. Pt did not want Dr.Sethi to call her because could be listening and get very angry.  Rn stated she understood her concerns. Pts wife would like for Dr. Leonie Man to tell the pt about if its safe for him to drive.

## 2015-02-20 ENCOUNTER — Encounter: Payer: Self-pay | Admitting: Neurology

## 2015-02-20 ENCOUNTER — Ambulatory Visit (INDEPENDENT_AMBULATORY_CARE_PROVIDER_SITE_OTHER): Payer: Medicare Other | Admitting: Neurology

## 2015-02-20 VITALS — BP 129/74 | HR 89 | Ht 67.0 in | Wt 182.2 lb

## 2015-02-20 DIAGNOSIS — R29898 Other symptoms and signs involving the musculoskeletal system: Secondary | ICD-10-CM

## 2015-02-20 DIAGNOSIS — G3184 Mild cognitive impairment, so stated: Secondary | ICD-10-CM

## 2015-02-20 DIAGNOSIS — R451 Restlessness and agitation: Secondary | ICD-10-CM

## 2015-02-20 HISTORY — DX: Other symptoms and signs involving the musculoskeletal system: R29.898

## 2015-02-20 HISTORY — DX: Restlessness and agitation: R45.1

## 2015-02-20 MED ORDER — DIVALPROEX SODIUM ER 500 MG PO TB24: 500.0000 mg | ORAL_TABLET | Freq: Every day | ORAL | Status: DC

## 2015-02-20 NOTE — Progress Notes (Signed)
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 Vascular risk factors include hypertension, hyperlipidemia and diabetes mellitus.   [name] comes in for stroke followup, last office visit was 03/17/13 with me. BP is well controlled, is 107/65 in office, they report that all lab work was good at last visit, I do not have these results. Tolerating Plavix well, with only reports of mild bruising. His last carotid doppler checked on 07/13/12 was negative for hemodynamically significant stenosis. Doing well, no new neurovascular symptoms.  Update 12/14/2014 : He returns for follow-up after last visit 1 year ago. Continues to do well from neurovascular standpoint without recurrent stroke or TIA symptoms. He remains on Plavix which is tolerating well without bleeding or bruising. States his blood pressure is under good control and today it is 119/68. He states his last lipid profile checked by her primary physician was fine and this was in April. He and his wife are concerned about new complaints of agitation, very well change, mild memory difficulties and occasional confusions and verbal outbursts. Patient denies feeling depressed or having any mood problems in the past. He has not had any lab work or evaluation for this. Update 02/20/2015 : He returns for follow-up after last visit 2 months ago. He is a complaint by his wife. He continues to have intermittent episodes of combativeness, aggressive behavior. He goes into an intense rage and feels combative and gets upset easily over simple things like using the TV remote or eating cheese crackers. He feels remorseful later apologizes for his bad behavior. Patient has long-standing history of depression and has been taking  Paxil 40 mg daily and Effexor 150 mg twice daily prescribed by his primary physician for several years. Patient had one episode of sudden onset of bilateral leg weakness and numbness and gait difficulty about a week ago. He went to the restroom and while coming back noted trouble walking. He felt gradually better the next day but had some trouble walking with his balance for at least to 3 days. He did not seek medical help despite his wife advising him to do so. He does admit to chronic low back pain for several years which seems to have increased recently. 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 He however has not had any follow-up for that. He denies any trouble controlling his bladder, bowel, radicular pain or recent injury or fall. Patient did undergo MRI scan of the brain on 12/26/14 which I personally reviewed the films and shows only changes of small vessel disease. I will discuss the results with the patient and wife and answered questions. EEG also done on 12/22/14 was normal and lab work done on 12/14/14 showed normal vitamin B12, TSH and RPR. I  explained all these results to the patient and wife and answered questions. Review of Systems  Out of a complete 14 system review, the patient complains of only the following symptoms, and all other reviewed systems are negative. Memory loss, confusion, behavioral problem, agitation, combativeness from, back pain, balance difficulty, leg weakness, numbness, gait abnormality  ALLERGIES: Allergies  Allergen Reactions  . Ciprofloxacin Rash  . Vicodin [Hydrocodone-Acetaminophen] Rash    HOME MEDICATIONS: Outpatient Prescriptions Prior to Visit  Medication Sig  Dispense Refill  . ALPRAZolam (XANAX) 1 MG tablet Take 0.5 mg by mouth 2 (two) times daily as needed for anxiety.     . clopidogrel (PLAVIX) 75 MG tablet Take 1 tablet (75 mg total) by mouth daily. 90 tablet 3  . ferrous sulfate 325 (65 FE) MG  tablet Take 325 mg by mouth daily with breakfast.    . ibuprofen (ADVIL,MOTRIN) 200 MG tablet Take 200 mg by mouth every 6 (six) hours as needed for pain.    . Insulin Glargine (LANTUS SOLOSTAR) 100 UNIT/ML SOPN Inject 35 Units into the skin daily.    Marland Kitchen lisinopril (PRINIVIL,ZESTRIL) 5 MG tablet Take 5 mg by mouth every morning.    . metFORMIN (GLUCOPHAGE) 1000 MG tablet Take 1 tablet by mouth 2 (two) times daily.    . Multiple Vitamin (MULTIVITAMIN WITH MINERALS) TABS Take 1 tablet by mouth daily.    . ONE TOUCH ULTRA TEST test strip     . pantoprazole (PROTONIX) 40 MG tablet Take 1 tablet by mouth daily.    Marland Kitchen PARoxetine (PAXIL) 40 MG tablet Take 40 mg by mouth at bedtime.     . simvastatin (ZOCOR) 40 MG tablet Take 40 mg by mouth every evening.    . venlafaxine XR (EFFEXOR-XR) 150 MG 24 hr capsule Take 150 mg by mouth 2 (two) times daily.    . polyethylene glycol (MIRALAX / GLYCOLAX) packet Take 17 g by mouth daily as needed (for constipation).      No facility-administered medications prior to visit.    PHYSICAL EXAM Filed Vitals:   02/20/15 1109  BP: 129/74  Pulse: 89  Height: 5\' 7"  (1.702 m)  Weight: 182 lb 3.2 oz (82.645 kg)   Body mass index is 28.53 kg/(m^2).  General: Pleasant elderly Caucasian male, in no distress. Afebrile.  Head: nontraumatic  Ears, Nose and Throat: Hearing is decreased.  Neck: supple without bruit  Respiratory: clear to auscultation  Cardiovascular: no murmur or gallop  Skin: Few ecchymosis on right hand   Neurologic Exam  Mental Status: Awake, alert and oriented to time, place and person. Speech and language appear normal. Mini-Mental status exam scored 30/30. Animal naming test 10. Clock drawing 4/4. Geriatric depression scale 6 suggestive of mild depression only. Cranial Nerves: Eye movements are full range without nystagmus. Fundi not visualized. Visual fields are full to confrontational testing. Face is symmetric without weakness. Tongue is  midline. Patient is heard of hearing bilaterally.  Motor: reveals no upper or lower extremity drift. Symmetric and equal strength in all four extremities. No focal weakness.  Sensory: Touch and pinprick sensations are normal.  Coordination: normal  Gait and Station: Slightly broad-based and unsteady   gait with moderate difficulty with tandem walking. Unable to stand on either foot unsupported or on his heels Reflexes: Deep tendon reflexes are 2+ symmetric. Except right ankle jerk is depressed Plantars are downgoing.   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 Vascular risk factors include hypertension, hyperlipidemia and diabetes mellitus. Doing well, no new neurovascular symptoms. New complaints of memory and behavioral changes possibly mild cognitive impairment versus depression with negative workup for reversible causes, EEG and brain imaging. Recent episode of transient bilateral leg weakness and numbness concerning for spinal stenosis/myelopathy given prior history of back pain and back surgery   Plan:  I had a long discussion the patient and his wife regarding his episodes of intermittent combativeness and agitation  and mild cognitive impairment. I personally reviewed MRI films, EEG and lab results with the patient and husband and answered questions. Recommend trial of Depakote ER 500 mg daily for agitated behavior. Refer to neuropsychologist to evaluate contribution of depression to his agitated combativeness. I'm concerned about patient's recent episode of bilateral leg weakness given history of previous spine surgery will obtain MRI scan of the cervical and lumbar spine to look for spinal stenosis. Greater than 50% of time during this 25 minute visit was spent on review of images, lab results, counseling and coordination of care Patient will return for follow-up in 2 months or call earlier if  necessary.                           Return in about 2 months (around 04/22/2015).  Antony Contras, MD  02/20/2015, 12:56 PM [facility] [address] [place], [place] [address] [phone]  Note: This document was prepared with digital dictation and possible smart phrase technology. Any transcriptional errors that result from this process are unintentional.

## 2015-02-20 NOTE — Patient Instructions (Signed)
I had a long discussion the patient and his wife regarding his episodes of intermittent combativeness and agitation and mild cognitive impairment. I personally reviewed MRI films, EEG and lab results with the patient and husband and answered questions. Recommend trial of Depakote ER 500 mg daily for agitated behavior. Refer to neuropsychologist to evaluate contribution of depression to his agitated combativeness. I'm concerned about patient's recent episode of bilateral leg weakness given history of previous spine surgery will obtain MRI scan of the cervical and lumbar spine to look for spinal stenosis. Patient will return for follow-up in 2 months or call earlier if necessary.

## 2015-03-08 ENCOUNTER — Ambulatory Visit
Admission: RE | Admit: 2015-03-08 | Discharge: 2015-03-08 | Disposition: A | Discharge status: Home / Self Care | Payer: Medicare Other | Source: Ambulatory Visit | Attending: Neurology | Admitting: Neurology

## 2015-03-08 DIAGNOSIS — R29898 Other symptoms and signs involving the musculoskeletal system: Secondary | ICD-10-CM

## 2015-03-08 MED ORDER — GADOBENATE DIMEGLUMINE 529 MG/ML IV SOLN
17.0000 mL | Freq: Once | INTRAVENOUS | Status: AC | PRN
  Administered 2015-03-08: 17 mL via INTRAVENOUS

## 2015-03-15 ENCOUNTER — Encounter: Payer: Self-pay | Admitting: Neurology

## 2015-03-19 ENCOUNTER — Other Ambulatory Visit: Payer: Self-pay | Admitting: Neurology

## 2015-03-19 ENCOUNTER — Telehealth: Payer: Self-pay | Admitting: Neurology

## 2015-03-19 DIAGNOSIS — R29898 Other symptoms and signs involving the musculoskeletal system: Secondary | ICD-10-CM

## 2015-03-19 NOTE — Telephone Encounter (Signed)
If patients wife call back a message was sent to Dr. Leonie Man to call about her husbands MRI results.Also please tell wife that a DPR form was sent for her husband to sign to release information to [facility] and it needs to be mail back to [facility].

## 2015-03-19 NOTE — Telephone Encounter (Signed)
Patient's wife is calling to get MRI results for the patient.

## 2015-03-19 NOTE — Telephone Encounter (Signed)
I spoke to Reginald Patterson and gave him results of MRI scan of the lumbar spine showing possible spinal stenosis at L2-3 and limited evaluation at L4-  5-S1 due to metallic artifacts from prior surgery. Since patient continues to have trouble walking and leg weakness I recommend referral to neurosurgeon for further evaluation with myelogram or repeat surgery. She was agreeable with the plan. Patient also has shown response to Depakote and his behavior has come down. We also discussed MRI scan of cervical spine and associated mild spinal stenosis but this was unlikely to be the cause of his leg weakness

## 2015-04-04 ENCOUNTER — Other Ambulatory Visit: Payer: Self-pay | Admitting: Neurological Surgery

## 2015-04-04 DIAGNOSIS — M4727 Other spondylosis with radiculopathy, lumbosacral region: Secondary | ICD-10-CM

## 2015-04-17 ENCOUNTER — Ambulatory Visit
Admission: RE | Admit: 2015-04-17 | Discharge: 2015-04-17 | Disposition: A | Discharge status: Home / Self Care | Payer: Medicare Other | Source: Ambulatory Visit | Attending: Neurological Surgery | Admitting: Neurological Surgery

## 2015-04-17 DIAGNOSIS — M4727 Other spondylosis with radiculopathy, lumbosacral region: Secondary | ICD-10-CM

## 2015-04-17 MED ORDER — IOHEXOL 180 MG/ML  SOLN
15.0000 mL | Freq: Once | INTRAMUSCULAR | Status: DC | PRN
  Administered 2015-04-17: 15 mL via INTRATHECAL

## 2015-04-17 NOTE — Discharge Instructions (Signed)
Myelogram Discharge Instructions  1. Go home and rest quietly for the next 24 hours.  It is important to lie flat for the next 24 hours.  Get up only to go to the restroom.  You may lie in the bed or on a couch on your back, your stomach, your left side or your right side.  You may have one pillow under your head.  You may have pillows between your knees while you are on your side or under your knees while you are on your back.  2. DO NOT drive today.  Recline the seat as far back as it will go, while still wearing your seat belt, on the way home.  3. You may get up to go to the bathroom as needed.  You may sit up for 10 minutes to eat.  You may resume your normal diet and medications unless otherwise indicated.  Drink lots of extra fluids today and tomorrow.  4. The incidence of headache, nausea, or vomiting is about 5% (one in 20 patients).  If you develop a headache, lie flat and drink plenty of fluids until the headache goes away.  Caffeinated beverages may be helpful.  If you develop severe nausea and vomiting or a headache that does not go away with flat bed rest, call [phone].  5. You may resume normal activities after your 24 hours of bed rest is over; however, do not exert yourself strongly or do any heavy lifting tomorrow. If when you get up you have a headache when standing, go back to bed and force fluids for another 24 hours.  6. Call your physician for a follow-up appointment.  The results of your myelogram will be sent directly to your physician by the following day.  7. If you have any questions or if complications develop after you arrive home, please call [phone].  Discharge instructions have been explained to the patient.  The patient, or the person responsible for the patient, fully understands these instructions.     MAY RESUME PLAVIX TODAY.    May resume Effexor and Paxil on Nov. 23, 2016, after 9:30 am.

## 2015-04-17 NOTE — Progress Notes (Addendum)
Pt states he has been off Plavix, Paxil and Effexor as ordered.  Discharge instructions explained to pt and his wife. Wife states pt took 1 and 1/2 of his own Berenice Primas, will not give pt any Valium.

## 2015-05-04 ENCOUNTER — Encounter: Payer: Self-pay | Admitting: Neurology

## 2015-05-04 ENCOUNTER — Ambulatory Visit (INDEPENDENT_AMBULATORY_CARE_PROVIDER_SITE_OTHER): Payer: Medicare Other | Admitting: Neurology

## 2015-05-04 VITALS — BP 117/74 | HR 94 | Ht 67.0 in | Wt 182.0 lb

## 2015-05-04 DIAGNOSIS — R269 Unspecified abnormalities of gait and mobility: Secondary | ICD-10-CM | POA: Diagnosis not present

## 2015-05-04 NOTE — Patient Instructions (Signed)
I had a long discussion the patient and wife regarding his intermittent gait abnormality and mild cognitive impairment. I recommend checking a CT scan of the head to look for any interval change in ventricular size to look for normal pressure hydrocephalus. We also discussed gait and safety precautions. I also advised the patient to see his primary physician to adjust his depression medications which may be contributing to his worsening mild cognitive impairment. Continue Depakote as it seems to be helping his agitation and behavior. Continue Plavix for stroke prevention with strict control of blood pressure with goal below 130/90, lipids with LDL cholesterol goal below 70 mg percent and diabetes with hemoglobin A1c goal below 6.5%. Return for follow-up in 3 months or call earlier if necessary.  Fall Prevention in the Home  Falls can cause injuries. They can happen to people of all ages. There are many things you can do to make your home safe and to help prevent falls.  WHAT CAN I DO ON THE OUTSIDE OF MY HOME?  Regularly fix the edges of walkways and driveways and fix any cracks.  Remove anything that might make you trip as you walk through a door, such as a raised step or threshold.  Trim any bushes or trees on the path to your home.  Use bright outdoor lighting.  Clear any walking paths of anything that might make someone trip, such as rocks or tools.  Regularly check to see if handrails are loose or broken. Make sure that both sides of any steps have handrails.  Any raised decks and porches should have guardrails on the edges.  Have any leaves, snow, or ice cleared regularly.  Use sand or salt on walking paths during winter.  Clean up any spills in your garage right away. This includes oil or grease spills. WHAT CAN I DO IN THE BATHROOM?   Use night lights.  Install grab bars by the toilet and in the tub and shower. Do not use towel bars as grab bars.  Use non-skid mats or decals in  the tub or shower.  If you need to sit down in the shower, use a plastic, non-slip stool.  Keep the floor dry. Clean up any water that spills on the floor as soon as it happens.  Remove soap buildup in the tub or shower regularly.  Attach bath mats securely with double-sided non-slip rug tape.  Do not have throw rugs and other things on the floor that can make you trip. WHAT CAN I DO IN THE BEDROOM?  Use night lights.  Make sure that you have a light by your bed that is easy to reach.  Do not use any sheets or blankets that are too big for your bed. They should not hang down onto the floor.  Have a firm chair that has side arms. You can use this for support while you get dressed.  Do not have throw rugs and other things on the floor that can make you trip. WHAT CAN I DO IN THE KITCHEN?  Clean up any spills right away.  Avoid walking on wet floors.  Keep items that you use a lot in easy-to-reach places.  If you need to reach something above you, use a strong step stool that has a grab bar.  Keep electrical cords out of the way.  Do not use floor polish or wax that makes floors slippery. If you must use wax, use non-skid floor wax.  Do not have throw rugs and  other things on the floor that can make you trip. WHAT CAN I DO WITH MY STAIRS?  Do not leave any items on the stairs.  Make sure that there are handrails on both sides of the stairs and use them. Fix handrails that are broken or loose. Make sure that handrails are as long as the stairways.  Check any carpeting to make sure that it is firmly attached to the stairs. Fix any carpet that is loose or worn.  Avoid having throw rugs at the top or bottom of the stairs. If you do have throw rugs, attach them to the floor with carpet tape.  Make sure that you have a light switch at the top of the stairs and the bottom of the stairs. If you do not have them, ask someone to add them for you. WHAT ELSE CAN I DO TO HELP PREVENT  FALLS?  Wear shoes that:  Do not have high heels.  Have rubber bottoms.  Are comfortable and fit you well.  Are closed at the toe. Do not wear sandals.  If you use a stepladder:  Make sure that it is fully opened. Do not climb a closed stepladder.  Make sure that both sides of the stepladder are locked into place.  Ask someone to hold it for you, if possible.  Clearly mark and make sure that you can see:  Any grab bars or handrails.  First and last steps.  Where the edge of each step is.  Use tools that help you move around (mobility aids) if they are needed. These include:  Canes.  Walkers.  Scooters.  Crutches.  Turn on the lights when you go into a dark area. Replace any light bulbs as soon as they burn out.  Set up your furniture so you have a clear path. Avoid moving your furniture around.  If any of your floors are uneven, fix them.  If there are any pets around you, be aware of where they are.  Review your medicines with your doctor. Some medicines can make you feel dizzy. This can increase your chance of falling. Ask your doctor what other things that you can do to help prevent falls.   This information is not intended to replace advice given to you by your health care provider. Make sure you discuss any questions you have with your health care provider.   Document Released: 03/08/2009 Document Revised: 09/26/2014 Document Reviewed: 06/16/2014 Elsevier Interactive Patient Education [employer].

## 2015-05-04 NOTE — Progress Notes (Signed)
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 Vascular risk factors include hypertension, hyperlipidemia and diabetes mellitus.   Reginald Patterson comes in for stroke followup, last office visit was 03/17/13 with me. BP is well controlled, is 107/65 in office, they report that all lab work was good at last visit, I do not have these results. Tolerating Plavix well, with only reports of mild bruising. His last carotid doppler checked on 07/13/12 was negative for hemodynamically significant stenosis. Doing well, no new neurovascular symptoms.  Update 12/14/2014 : He returns for follow-up after last visit 1 year ago. Continues to do well from neurovascular standpoint without recurrent stroke or TIA symptoms. He remains on Plavix which is tolerating well without bleeding or bruising. States his blood pressure is under good control and today it is 119/68. He states his last lipid profile checked by her primary physician was fine and this was in April. He and his wife are concerned about new complaints of agitation, very well change, mild memory difficulties and occasional confusions and verbal outbursts. Patient denies feeling depressed or having any mood problems in the past. He has not had any lab work or evaluation for this. Update 02/20/2015 : He returns for follow-up after last visit 2 months ago. He is a complaint by his wife. He continues to have intermittent episodes of combativeness, aggressive behavior. He goes into an intense rage and feels combative and gets upset easily over simple things like using the TV remote or eating cheese crackers. He feels remorseful later apologizes for his bad behavior. Patient has long-standing history of depression and has been taking  Paxil 40 mg daily and Effexor 150 mg twice daily prescribed by his primary physician for several years. Patient had one episode of sudden onset of bilateral leg weakness and numbness and gait difficulty about a week ago. He went to the restroom and while coming back noted trouble walking. He felt gradually better the next day but had some trouble walking with his balance for at least to 3 days. He did not seek medical help despite his wife advising him to do so. He does admit to chronic low back pain for several years which seems to have increased recently. 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 He however has not had any follow-up for that. He denies any trouble controlling his bladder, bowel, radicular pain or recent injury or fall. Patient did undergo MRI scan of the brain on 12/26/14 which I personally reviewed the films and shows only changes of small vessel disease. I will discuss the results with the patient and wife and answered questions. EEG also done on 12/22/14 was normal and lab work done on 12/14/14 showed normal vitamin B12, TSH and RPR. I  explained all these results to the patient and wife and answered questions. Update 05/04/2015 : He returns for follow-up after last visit 3 months ago. Accompanied by his wife. She's noticed some improvement in her agitation and behavior on the Depakote. However he continues to have memory and cognitive difficulties. He is having trouble with walking. Wife feels at times he is shuffling and feet are stuck to the ground. His balance is poor but he is had no major falls. He is  also being bottle by left knee pain which requires attention but patient is not willing to consider surgery. He has a appointment with his primary physician next week to discuss this. Patient did undergo MRI scan of the lumbar spine which had ordered on 03/08/15 and personally reviewed shows significant postoperative metallic artifacts from  prior surgery but raises question of left-sided L3-4 and L2-3 foraminal stenosis. MRI scan of the C-spine shows mild degenerative changes at C3-4 and C4-5 without significant compression. Patient was referred to neurosurgeon Reginald Patterson who did a CT myelogram on 04/17/15 which shows only mild left-sided L2-3 and L4-5 foraminal narrowing but no significant compression. He did not feel the patient required any more surgery. Patient's wife states that he continues to have occasional brief episodes of anger but is not as combative. He has been on Effexor and Paxil for depression for a long time but wife feels he may have stopped one of the 2 medications. His depression in fact seems quite worse on my exam today. Patient has chronic bladder issues and has to do intermittent self cath 4 times a day. Review of Systems  Out of a complete 14 system review, the patient complains of only the following symptoms, and all other reviewed systems are negative. Memory loss, confusion, disorientation, gait difficulty, balance problems, incontinence, and behavior, agitation  ALLERGIES: Allergies  Allergen Reactions  . Ciprofloxacin Rash  . Vicodin [Hydrocodone-Acetaminophen] Rash    HOME MEDICATIONS: Outpatient Prescriptions Prior to Visit  Medication Sig Dispense Refill  . ALPRAZolam (XANAX) 1 MG tablet Take 0.5 mg by mouth 2 (two) times daily as needed for anxiety.     . clopidogrel (PLAVIX) 75 MG tablet Take 1 tablet (75 mg total) by mouth daily. 90 tablet 3  . divalproex (DEPAKOTE ER) 500 MG 24 hr tablet Take 1 tablet (500 mg total) by mouth daily. 30 tablet 3  . ferrous sulfate 325 (65 FE) MG tablet Take 325 mg by mouth 2 (two) times daily with a meal.     . ibuprofen (ADVIL,MOTRIN) 200 MG tablet Take 200 mg by mouth every 6 (six) hours as needed for pain.    . Insulin Glargine (LANTUS SOLOSTAR) 100 UNIT/ML SOPN Inject 35 Units into the skin daily.    Marland Kitchen lisinopril (PRINIVIL,ZESTRIL) 5 MG tablet Take 5 mg by  mouth every morning.    . metFORMIN (GLUCOPHAGE) 1000 MG tablet Take 1 tablet by mouth 2 (two) times daily.    . Multiple Vitamin (MULTIVITAMIN WITH MINERALS) TABS Take 1 tablet by mouth daily.    . ONE TOUCH ULTRA TEST test strip     . pantoprazole (PROTONIX) 40 MG tablet Take 1 tablet by mouth daily.    Marland Kitchen PARoxetine (PAXIL) 40 MG tablet Take 40 mg by mouth at bedtime.     . simvastatin (ZOCOR) 40 MG tablet Take 40 mg by mouth every evening.    . venlafaxine XR (EFFEXOR-XR) 150 MG 24 hr capsule Take 150 mg by mouth 2 (two) times daily.    . Wheat Dextrin (BENEFIBER PO) Take by mouth.     No facility-administered medications prior to visit.    PHYSICAL EXAM Filed Vitals:   05/04/15 1110  BP: 117/74  Pulse: 94  Height: 5\' 7"  (1.702 m)  Weight: 182 lb (82.555 kg)   Body mass index is 28.5 kg/(m^2).  General: Pleasant elderly Caucasian male, in no distress. Afebrile.  Head: nontraumatic  Ears, Nose and Throat: Hearing is decreased.  Neck: supple without bruit  Respiratory: clear to auscultation  Cardiovascular: no murmur or gallop  Skin: Few ecchymosis on right hand   Neurologic Exam  Mental Status: Awake, alert and oriented to time, place and person. Speech and language appear normal. Mini-Mental status exam scored 21/30 with deficits in attention, orientation, calculation and three-step commands. Animal naming test 12. Clock drawing 4/4. Geriatric depression scale 11 suggestive of severe depression suggestive of mild depression only. Cranial Nerves: Eye movements are full range without nystagmus. Fundi not visualized. Visual fields are full to confrontational testing. Face is symmetric without weakness. Tongue is midline. Patient is heard of hearing bilaterally.  Motor: reveals no upper or lower extremity drift. Symmetric and equal strength in all four extremities. No focal weakness.  Sensory: Touch and pinprick sensations are normal.  Coordination: normal  Gait and Station:  Slightly broad-based and unsteady   gait with moderate difficulty with tandem walking. Unable to stand on either foot unsupported or on his heels Reflexes: Deep tendon reflexes are 2+ symmetric. Except right ankle jerk is depressed Plantars are downgoing.   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 Vascular risk factors include hypertension, hyperlipidemia and diabetes mellitus. Doing well, no new neurovascular symptoms. New complaints of memory and behavioral changes possibly mild cognitive impairment versus depression with negative workup for reversible causes, EEG and brain imaging. Recent gait difficulties of unclear etiology with negative work up  for spinal stenosis/myelopathy . NPH would be unlikely given MRI scan in August 2016 not showing ventricular dilatation  Plan:  I had a long discussion the patient and wife regarding his intermittent gait abnormality and mild cognitive impairment. I recommend checking a CT scan of the head to look for any interval change in ventricular size to look for normal pressure hydrocephalus. We also discussed gait and safety precautions. I also advised the patient to see his primary physician to adjust his depression medications which may be contributing to his worsening mild cognitive impairment. Continue Depakote as it seems to be helping his agitation and behavior. Continue Plavix for stroke prevention with strict control of blood pressure with goal below 130/90, lipids with LDL cholesterol goal below 70 mg percent and diabetes with hemoglobin A1c goal below 6.5%. Return for follow-up in 3 months or call earlier if necessary.                            Return in about 3 months (around 08/02/2015).  Antony Contras, MD  05/04/2015, 3:38 PM [facility] [address], [place], [place] [address] [phone]  Note: This document was  prepared with digital dictation and possible smart phrase technology. Any transcriptional errors that result from this process are unintentional.

## 2015-05-09 ENCOUNTER — Encounter: Payer: Self-pay | Admitting: Neurology

## 2015-05-10 ENCOUNTER — Ambulatory Visit
Admission: RE | Admit: 2015-05-10 | Discharge: 2015-05-10 | Disposition: A | Discharge status: Home / Self Care | Payer: Medicare Other | Source: Ambulatory Visit | Attending: Neurology | Admitting: Neurology

## 2015-05-10 DIAGNOSIS — R269 Unspecified abnormalities of gait and mobility: Secondary | ICD-10-CM | POA: Diagnosis not present

## 2015-05-15 ENCOUNTER — Telehealth: Payer: Self-pay | Admitting: *Deleted

## 2015-05-15 NOTE — Telephone Encounter (Signed)
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 No new or unexpected or worrisome findings

## 2015-05-15 NOTE — Telephone Encounter (Signed)
LVM for pt to call about results. Gave [facility] phone number and hours.  

## 2015-05-18 MED ORDER — DIVALPROEX SODIUM ER 500 MG PO TB24: 500.0000 mg | ORAL_TABLET | Freq: Two times a day (BID) | ORAL | Status: DC

## 2015-05-18 NOTE — Telephone Encounter (Signed)
I called the patient's wife to return her email message. I discuss CT scan results with her explaining there was no evidence of normal pressure hydrocephalus or fluid in the brain and only age-related changes. She was concerned about his behavior and agitation increasing during the day and hence I recommended we increase Depakote ER 500 mg 2 twice daily now. She was advised to call back next week.

## 2015-05-31 ENCOUNTER — Telehealth: Payer: Self-pay

## 2015-05-31 ENCOUNTER — Telehealth: Payer: Self-pay | Admitting: Neurology

## 2015-05-31 NOTE — Telephone Encounter (Signed)
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 No new or unexpected or worrisome findings Pts wife verbalized understanding of test.

## 2015-05-31 NOTE — Telephone Encounter (Signed)
Pt called back to speak with nurse about results. Phone numbers were verified with her as well.

## 2015-05-31 NOTE — Telephone Encounter (Signed)
See Previous note

## 2015-05-31 NOTE — Telephone Encounter (Signed)
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 No new or unexpected or worrisome findings

## 2015-06-13 ENCOUNTER — Telehealth: Payer: Self-pay | Admitting: Neurology

## 2015-06-13 NOTE — Telephone Encounter (Signed)
Pt's wife called requesting appt asap with Dr Leonie Man. I relayed he had appt scheduled for 08/09/15. She said if he couldn't be seen soon she would have to take him to ED. I relayed that Dr Leonie Man is not in office this week and she should take him to ED if she felt that he couldn't wait. She agreed to take pt to ED

## 2015-06-15 DIAGNOSIS — S2231XA Fracture of one rib, right side, initial encounter for closed fracture: Secondary | ICD-10-CM | POA: Insufficient documentation

## 2015-06-15 HISTORY — DX: Fracture of one rib, right side, initial encounter for closed fracture: S22.31XA

## 2015-06-16 ENCOUNTER — Emergency Department (HOSPITAL_COMMUNITY): Payer: Medicare Other

## 2015-06-16 ENCOUNTER — Encounter (HOSPITAL_COMMUNITY): Payer: Self-pay | Admitting: Emergency Medicine

## 2015-06-16 ENCOUNTER — Observation Stay (HOSPITAL_COMMUNITY)
Admission: EM | Admit: 2015-06-16 | Discharge: 2015-06-18 | Disposition: A | Discharge status: Skilled Nursing Facility | Payer: Medicare Other | Attending: Internal Medicine | Admitting: Internal Medicine

## 2015-06-16 DIAGNOSIS — Z881 Allergy status to other antibiotic agents status: Secondary | ICD-10-CM | POA: Insufficient documentation

## 2015-06-16 DIAGNOSIS — E1122 Type 2 diabetes mellitus with diabetic chronic kidney disease: Secondary | ICD-10-CM | POA: Diagnosis not present

## 2015-06-16 DIAGNOSIS — N390 Urinary tract infection, site not specified: Secondary | ICD-10-CM | POA: Diagnosis not present

## 2015-06-16 DIAGNOSIS — E785 Hyperlipidemia, unspecified: Secondary | ICD-10-CM | POA: Diagnosis not present

## 2015-06-16 DIAGNOSIS — G9340 Encephalopathy, unspecified: Principal | ICD-10-CM | POA: Insufficient documentation

## 2015-06-16 DIAGNOSIS — I639 Cerebral infarction, unspecified: Secondary | ICD-10-CM | POA: Diagnosis present

## 2015-06-16 DIAGNOSIS — F0390 Unspecified dementia without behavioral disturbance: Secondary | ICD-10-CM | POA: Insufficient documentation

## 2015-06-16 DIAGNOSIS — R531 Weakness: Secondary | ICD-10-CM

## 2015-06-16 DIAGNOSIS — Z8673 Personal history of transient ischemic attack (TIA), and cerebral infarction without residual deficits: Secondary | ICD-10-CM | POA: Insufficient documentation

## 2015-06-16 DIAGNOSIS — R451 Restlessness and agitation: Secondary | ICD-10-CM | POA: Diagnosis not present

## 2015-06-16 DIAGNOSIS — I129 Hypertensive chronic kidney disease with stage 1 through stage 4 chronic kidney disease, or unspecified chronic kidney disease: Secondary | ICD-10-CM | POA: Insufficient documentation

## 2015-06-16 DIAGNOSIS — I10 Essential (primary) hypertension: Secondary | ICD-10-CM | POA: Diagnosis present

## 2015-06-16 DIAGNOSIS — Z87891 Personal history of nicotine dependence: Secondary | ICD-10-CM | POA: Insufficient documentation

## 2015-06-16 DIAGNOSIS — Z794 Long term (current) use of insulin: Secondary | ICD-10-CM | POA: Insufficient documentation

## 2015-06-16 DIAGNOSIS — N182 Chronic kidney disease, stage 2 (mild): Secondary | ICD-10-CM | POA: Diagnosis not present

## 2015-06-16 DIAGNOSIS — S2231XA Fracture of one rib, right side, initial encounter for closed fracture: Secondary | ICD-10-CM

## 2015-06-16 DIAGNOSIS — E119 Type 2 diabetes mellitus without complications: Secondary | ICD-10-CM

## 2015-06-16 DIAGNOSIS — Z885 Allergy status to narcotic agent status: Secondary | ICD-10-CM | POA: Insufficient documentation

## 2015-06-16 DIAGNOSIS — R410 Disorientation, unspecified: Secondary | ICD-10-CM | POA: Diagnosis present

## 2015-06-16 HISTORY — DX: Encephalopathy, unspecified: G93.40

## 2015-06-16 LAB — URINALYSIS, ROUTINE W REFLEX MICROSCOPIC
BILIRUBIN URINE: NEGATIVE
Glucose, UA: NEGATIVE mg/dL
Ketones, ur: NEGATIVE mg/dL
Nitrite: POSITIVE — AB
Protein, ur: 100 mg/dL — AB
SPECIFIC GRAVITY, URINE: 1.01 (ref 1.005–1.030)
pH: 7 (ref 5.0–8.0)

## 2015-06-16 LAB — COMPREHENSIVE METABOLIC PANEL
ALT: 23 U/L (ref 17–63)
AST: 19 U/L (ref 15–41)
Albumin: 3.3 g/dL — ABNORMAL LOW (ref 3.5–5.0)
Alkaline Phosphatase: 77 U/L (ref 38–126)
Anion gap: 9 (ref 5–15)
BILIRUBIN TOTAL: 0.5 mg/dL (ref 0.3–1.2)
BUN: 12 mg/dL (ref 6–20)
CHLORIDE: 106 mmol/L (ref 101–111)
CO2: 26 mmol/L (ref 22–32)
Calcium: 9.2 mg/dL (ref 8.9–10.3)
Creatinine, Ser: 0.85 mg/dL (ref 0.61–1.24)
GFR calc Af Amer: >60 mL/min (ref >60)
GFR calc non Af Amer: >60 mL/min (ref >60)
GLUCOSE: 121 mg/dL — AB (ref 65–99)
POTASSIUM: 4.2 mmol/L (ref 3.5–5.1)
SODIUM: 141 mmol/L (ref 135–145)
Total Protein: 6.5 g/dL (ref 6.5–8.1)

## 2015-06-16 LAB — CBC WITH DIFFERENTIAL/PLATELET
Basophils Absolute: 0 10*3/uL (ref 0.0–0.1)
Basophils Relative: 0 %
EOS ABS: 0.2 10*3/uL (ref 0.0–0.7)
EOS PCT: 2 %
HCT: 43.4 % (ref 39.0–52.0)
Hemoglobin: 14.4 g/dL (ref 13.0–17.0)
Lymphocytes Relative: 14 %
Lymphs Abs: 1.3 10*3/uL (ref 0.7–4.0)
MCH: 28.9 pg (ref 26.0–34.0)
MCHC: 33.2 g/dL (ref 30.0–36.0)
MCV: 87 fL (ref 78.0–100.0)
MONO ABS: 1.2 10*3/uL — AB (ref 0.1–1.0)
MONOS PCT: 13 %
Neutro Abs: 6.7 10*3/uL (ref 1.7–7.7)
Neutrophils Relative %: 71 %
PLATELETS: 275 10*3/uL (ref 150–400)
RBC: 4.99 MIL/uL (ref 4.22–5.81)
RDW: 14 % (ref 11.5–15.5)
WBC: 9.3 10*3/uL (ref 4.0–10.5)

## 2015-06-16 LAB — URINE MICROSCOPIC-ADD ON: Squamous Epithelial / LPF: NONE SEEN

## 2015-06-16 LAB — GLUCOSE, CAPILLARY: GLUCOSE-CAPILLARY: 187 mg/dL — AB (ref 65–99)

## 2015-06-16 LAB — I-STAT TROPONIN, ED: Troponin i, poc: 0 ng/mL (ref 0.00–0.08)

## 2015-06-16 LAB — I-STAT CG4 LACTIC ACID, ED
LACTIC ACID, VENOUS: 2.12 mmol/L — AB (ref 0.5–2.0)
Lactic Acid, Venous: 2.61 mmol/L (ref 0.5–2.0)

## 2015-06-16 LAB — CBG MONITORING, ED: GLUCOSE-CAPILLARY: 120 mg/dL — AB (ref 65–99)

## 2015-06-16 MED ORDER — ACETAMINOPHEN 325 MG PO TABS: 650.0000 mg | ORAL_TABLET | Freq: Four times a day (QID) | ORAL | Status: DC | PRN

## 2015-06-16 MED ORDER — ENOXAPARIN SODIUM 40 MG/0.4ML ~~LOC~~ SOLN
40.0000 mg | SUBCUTANEOUS | Status: DC
  Administered 2015-06-16 – 2015-06-17 (×2): 40 mg via SUBCUTANEOUS
  Filled 2015-06-16 (×2): qty 0.4

## 2015-06-16 MED ORDER — DIVALPROEX SODIUM ER 500 MG PO TB24
500.0000 mg | ORAL_TABLET | Freq: Two times a day (BID) | ORAL | Status: DC
  Administered 2015-06-17 – 2015-06-18 (×4): 500 mg via ORAL
  Filled 2015-06-16 (×6): qty 1

## 2015-06-16 MED ORDER — INSULIN ASPART 100 UNIT/ML ~~LOC~~ SOLN: 0.0000 [IU] | Freq: Every day | SUBCUTANEOUS | Status: DC

## 2015-06-16 MED ORDER — CLOPIDOGREL BISULFATE 75 MG PO TABS
75.0000 mg | ORAL_TABLET | Freq: Every day | ORAL | Status: DC
  Administered 2015-06-17 – 2015-06-18 (×2): 75 mg via ORAL
  Filled 2015-06-16 (×2): qty 1

## 2015-06-16 MED ORDER — HALOPERIDOL LACTATE 5 MG/ML IJ SOLN
2.0000 mg | Freq: Four times a day (QID) | INTRAMUSCULAR | Status: DC | PRN
  Administered 2015-06-16 – 2015-06-18 (×3): 2 mg via INTRAVENOUS
  Filled 2015-06-16 (×3): qty 1

## 2015-06-16 MED ORDER — SODIUM CHLORIDE 0.9 % IV SOLN
INTRAVENOUS | Status: DC
  Administered 2015-06-16: 22:00:00 via INTRAVENOUS

## 2015-06-16 MED ORDER — SIMVASTATIN 40 MG PO TABS
40.0000 mg | ORAL_TABLET | Freq: Every evening | ORAL | Status: DC
Start: 2015-06-16 — End: 2015-06-18
  Administered 2015-06-17 – 2015-06-18 (×2): 40 mg via ORAL
  Filled 2015-06-16 (×2): qty 1

## 2015-06-16 MED ORDER — ONDANSETRON HCL 4 MG/2ML IJ SOLN: 4.0000 mg | Freq: Four times a day (QID) | INTRAMUSCULAR | Status: DC | PRN

## 2015-06-16 MED ORDER — INSULIN GLARGINE 100 UNIT/ML ~~LOC~~ SOLN
35.0000 [IU] | Freq: Every day | SUBCUTANEOUS | Status: DC
  Administered 2015-06-17 – 2015-06-18 (×2): 35 [IU] via SUBCUTANEOUS
  Filled 2015-06-16 (×2): qty 0.35

## 2015-06-16 MED ORDER — INSULIN ASPART 100 UNIT/ML ~~LOC~~ SOLN
0.0000 [IU] | Freq: Three times a day (TID) | SUBCUTANEOUS | Status: DC
  Administered 2015-06-17 (×2): 1 [IU] via SUBCUTANEOUS
  Administered 2015-06-17: 2 [IU] via SUBCUTANEOUS
  Administered 2015-06-18: 1 [IU] via SUBCUTANEOUS

## 2015-06-16 MED ORDER — ONDANSETRON HCL 4 MG PO TABS: 4.0000 mg | ORAL_TABLET | Freq: Four times a day (QID) | ORAL | Status: DC | PRN

## 2015-06-16 MED ORDER — VENLAFAXINE HCL ER 150 MG PO CP24
150.0000 mg | ORAL_CAPSULE | Freq: Every day | ORAL | Status: DC
  Administered 2015-06-17 – 2015-06-18 (×2): 150 mg via ORAL
  Filled 2015-06-16 (×2): qty 1

## 2015-06-16 MED ORDER — FERROUS SULFATE 325 (65 FE) MG PO TABS
325.0000 mg | ORAL_TABLET | Freq: Two times a day (BID) | ORAL | Status: DC
  Administered 2015-06-17 – 2015-06-18 (×4): 325 mg via ORAL
  Filled 2015-06-16 (×4): qty 1

## 2015-06-16 MED ORDER — DEXTROSE 5 % IV SOLN
1.0000 g | INTRAVENOUS | Status: DC
  Administered 2015-06-17: 1 g via INTRAVENOUS
  Filled 2015-06-16 (×2): qty 10

## 2015-06-16 MED ORDER — DEXTROSE 5 % IV SOLN
1.0000 g | Freq: Once | INTRAVENOUS | Status: AC
  Administered 2015-06-16: 1 g via INTRAVENOUS
  Filled 2015-06-16: qty 10

## 2015-06-16 MED ORDER — ALPRAZOLAM 0.5 MG PO TABS
0.5000 mg | ORAL_TABLET | Freq: Two times a day (BID) | ORAL | Status: DC | PRN
  Administered 2015-06-16 – 2015-06-18 (×2): 0.5 mg via ORAL
  Filled 2015-06-16: qty 2
  Filled 2015-06-16: qty 1

## 2015-06-16 MED ORDER — PANTOPRAZOLE SODIUM 40 MG PO TBEC
40.0000 mg | DELAYED_RELEASE_TABLET | Freq: Every day | ORAL | Status: DC
Start: 2015-06-17 — End: 2015-06-18
  Administered 2015-06-17 – 2015-06-18 (×2): 40 mg via ORAL
  Filled 2015-06-16 (×2): qty 1

## 2015-06-16 MED ORDER — ACETAMINOPHEN 650 MG RE SUPP
650.0000 mg | Freq: Four times a day (QID) | RECTAL | Status: DC | PRN
Start: 2015-06-16 — End: 2015-06-18

## 2015-06-16 NOTE — ED Notes (Signed)
Pt given water per tatiyana.

## 2015-06-16 NOTE — H&P (Signed)
[facility] History and Physical  Reginald Patterson [id number] DOB: December 28, 1940 DOA: 06/16/2015  Referring physician: EDP PCP: Gennette Pac, MD   Chief Complaint: confused and agiatated at home since 2 days.   HPI: Reginald Patterson is a 75 y.o. male  With prior h/o stroke DM, hypertension, dementia, CKD stage 2 presents with family today, as he was deteriorating over the last one month both cognitively and physically. But over the last 2 days, pt was not able to get out of bed and he was confused and agitated asper the wife and son at bedside.  On arrival to ed he underwent cbc, bmp which were unremarkable, but UA was significant for infection.  CT head does show chronic atrophic changes and ischemic changes.  Wife also reports a fall one week ago and RIB series shows a right eight posterior lateral rib fracture.  He was referred to medical service for admission for UTI and dehydration and possible rehab placement.     Review of Systems:  Could not be obtained.   Past Medical History  Diagnosis Date  . Diabetes mellitus   . Hypertension   . Hyperlipidemia   . Anxiety   . Anemia 11-08-12    mild anemia  . Stroke [facility]) 11-08-12    x2 , last 09-29-11(confusion,left side weakness) no residual"some memory problems.  . Chronic kidney disease     must self cath   Past Surgical History  Procedure Laterality Date  . Back surgery      1996- metal plate and screws  . Colonoscopy with propofol N/A 01/12/2013    Procedure: COLONOSCOPY WITH PROPOFOL;  Surgeon: Arta Silence, MD;  Location: [facility] ENDOSCOPY;  Service: Endoscopy;  Laterality: N/A;   Social History:  reports that he has quit smoking. His smoking use included Cigarettes. He has a 5 pack-year smoking history. He quit smokeless tobacco use about 53 years ago. His smokeless tobacco use included Chew. He reports that he drinks alcohol. He reports that he does not use illicit drugs.  Allergies  Allergen Reactions  .  Ciprofloxacin Rash  . Vicodin [Hydrocodone-Acetaminophen] Rash    Family History  Problem Relation Age of Onset  . Dementia Mother   . Alzheimer's disease Mother   . Stroke Mother   . Lung cancer Father   . Colon cancer Maternal Grandmother     Prior to Admission medications   Medication Sig Start Date End Date Taking? Authorizing Provider  ALPRAZolam Duanne Moron) 1 MG tablet Take 0.5 mg by mouth 2 (two) times daily as needed for anxiety.    Yes Historical Provider, MD  clopidogrel (PLAVIX) 75 MG tablet Take 1 tablet (75 mg total) by mouth daily. 02/15/15  Yes Garvin Fila, MD  divalproex (DEPAKOTE ER) 500 MG 24 hr tablet Take 1 tablet (500 mg total) by mouth 2 (two) times daily. 05/18/15  Yes Garvin Fila, MD  ferrous sulfate 325 (65 FE) MG tablet Take 325 mg by mouth 2 (two) times daily with a meal.    Yes Historical Provider, MD  ibuprofen (ADVIL,MOTRIN) 200 MG tablet Take 200 mg by mouth every 6 (six) hours as needed for pain.   Yes Historical Provider, MD  Insulin Glargine (LANTUS SOLOSTAR) 100 UNIT/ML SOPN Inject 35 Units into the skin daily.   Yes Historical Provider, MD  lisinopril (PRINIVIL,ZESTRIL) 5 MG tablet Take 5 mg by mouth every morning.   Yes Historical Provider, MD  metFORMIN (GLUCOPHAGE) 1000 MG tablet Take 1 tablet by mouth  2 (two) times daily. 11/27/14  Yes Historical Provider, MD  Multiple Vitamin (MULTIVITAMIN WITH MINERALS) TABS Take 1 tablet by mouth daily.   Yes Historical Provider, MD  ONE TOUCH ULTRA TEST test strip  01/05/13  Yes Historical Provider, MD  pantoprazole (PROTONIX) 40 MG tablet Take 1 tablet by mouth daily. 11/27/14  Yes Historical Provider, MD  PARoxetine (PAXIL) 40 MG tablet Take 40 mg by mouth at bedtime.    Yes Historical Provider, MD  simvastatin (ZOCOR) 40 MG tablet Take 40 mg by mouth every evening.   Yes Historical Provider, MD  venlafaxine XR (EFFEXOR-XR) 150 MG 24 hr capsule Take 150 mg by mouth daily.    Yes Historical Provider, MD    venlafaxine XR (EFFEXOR-XR) 150 MG 24 hr capsule Take 150 mg by mouth daily with breakfast.   Yes Historical Provider, MD  Wheat Dextrin (BENEFIBER PO) Take by mouth.   Yes Historical Provider, MD   Physical Exam: Filed Vitals:   06/16/15 1630 06/16/15 1700 06/16/15 1708 06/16/15 1746  BP: 149/80 132/80  135/73  Pulse: 94 84  98  Temp:   98 F (36.7 C)   TempSrc:      Resp: 28 21  26   Height:      Weight:      SpO2: 97% 98%  94%    Wt Readings from Last 3 Encounters:  06/16/15 82.555 kg (182 lb)  05/04/15 82.555 kg (182 lb)  03/08/15 82.555 kg (182 lb)    General:  Appears restless and slightly agitated.  Eyes: PERRL, normal lids, irises & conjunctiva Neck: no LAD, masses or thyromegaly Cardiovascular: RRR, no m/r/g. No LE edema. Telemetry: SR, no arrhythmias  Respiratory: CTA bilaterally, no w/r/r. Normal respiratory effort. Abdomen: soft, ntnd Skin: no rash or induration seen on limited exam Musculoskeletal: grossly normal tone BUE/BLE Neurologic: not oriented to person or place or time. Able to move all extremities and answer simple question. No slurring of speech.           Labs on Admission:  Basic Metabolic Panel:  Recent Labs Lab 06/16/15 1515  NA 141  K 4.2  CL 106  CO2 26  GLUCOSE 121*  BUN 12  CREATININE 0.85  CALCIUM 9.2   Liver Function Tests:  Recent Labs Lab 06/16/15 1515  AST 19  ALT 23  ALKPHOS 77  BILITOT 0.5  PROT 6.5  ALBUMIN 3.3*   No results for input(s): LIPASE, AMYLASE in the last 168 hours. No results for input(s): AMMONIA in the last 168 hours. CBC:  Recent Labs Lab 06/16/15 1515  WBC 9.3  NEUTROABS 6.7  HGB 14.4  HCT 43.4  MCV 87.0  PLT 275   Cardiac Enzymes: No results for input(s): CKTOTAL, CKMB, CKMBINDEX, TROPONINI in the last 168 hours.  BNP (last 3 results) No results for input(s): BNP in the last 8760 hours.  ProBNP (last 3 results) No results for input(s): PROBNP in the last 8760 hours.  CBG: No  results for input(s): GLUCAP in the last 168 hours.  Radiological Exams on Admission: Dg Ribs Unilateral W/chest Right  06/16/2015  CLINICAL DATA:  Increase generalized weakness. Back pain status post multiple falls. EXAM: RIGHT RIBS AND CHEST - 3+ VIEW COMPARISON:  03/23/2015 FINDINGS: The cardiac silhouette is normal. Mediastinal contours are prominent which may be due to portable technique and low lung volumes. There is no evidence of focal airspace consolidation, pleural effusion or pneumothorax. There is a minimally displaced fracture of the right  eighth posterior lateral rib. Soft tissues are grossly normal. IMPRESSION: Minimally displaced right eighth posterior lateral rib fracture. Prominence of the mediastinal contours. This may represent overlapping vascular markings, exaggerated by low lung volumes and portable technique. Mediastinal lymphadenopathy however cannot be excluded. Electronically Signed   By: Fidela Salisbury M.D.   On: 06/16/2015 14:57   Dg Lumbar Spine Complete  06/16/2015  CLINICAL DATA:  Increased generalized weakness.  Back pain. EXAM: LUMBAR SPINE - COMPLETE 4+ VIEW COMPARISON:  Myelogram of the lumbar spine dated 04/15/2015 FINDINGS: There is no evidence of lumbar spine fracture. Patient status post L5-S1 posterior fusion with grade 2 anterolisthesis of L5 on S1, stable. Vertebral body heights are preserved. Mild multilevel osteoarthritic changes. Atherosclerotic calcifications of the abdominal aorta are noted. IMPRESSION: No evidence of lumbar spine fracture. Stable appearance of L5-S1 posterior fusion with grade 2 anterolisthesis of L5 on S1. Mild multilevel disc and facet degenerative changes. Electronically Signed   By: Fidela Salisbury M.D.   On: 06/16/2015 15:04   Ct Head Wo Contrast  06/16/2015  CLINICAL DATA:  Increasing weakness and confusion, fall 6 weeks ago, initial encounter EXAM: CT HEAD WITHOUT CONTRAST CT CERVICAL SPINE WITHOUT CONTRAST TECHNIQUE:  Multidetector CT imaging of the head and cervical spine was performed following the standard protocol without intravenous contrast. Multiplanar CT image reconstructions of the cervical spine were also generated. COMPARISON:  05/10/2015 FINDINGS: CT HEAD FINDINGS The bony calvarium is intact. Diffuse atrophic changes are noted. Areas of decreased attenuation are again seen consistent with chronic white matter ischemic change. No findings to suggest acute hemorrhage, acute infarction or space-occupying mass lesion are noted. CT CERVICAL SPINE FINDINGS Seven cervical segments are well visualized. Vertebral body height is well maintained. Disc space narrowing is noted at C5-6 and C6-7 with associated osteophytic changes. Facet hypertrophic changes are identified. No acute fracture or acute facet abnormality is noted. The odontoid is within normal limits. The surrounding soft tissues show no acute abnormality. IMPRESSION: CT of the head: Chronic atrophic and ischemic changes CT of cervical spine: Multilevel degenerative changes without acute abnormality. Electronically Signed   By: Inez Catalina M.D.   On: 06/16/2015 15:24   Ct Cervical Spine Wo Contrast  06/16/2015  CLINICAL DATA:  Increasing weakness and confusion, fall 6 weeks ago, initial encounter EXAM: CT HEAD WITHOUT CONTRAST CT CERVICAL SPINE WITHOUT CONTRAST TECHNIQUE: Multidetector CT imaging of the head and cervical spine was performed following the standard protocol without intravenous contrast. Multiplanar CT image reconstructions of the cervical spine were also generated. COMPARISON:  05/10/2015 FINDINGS: CT HEAD FINDINGS The bony calvarium is intact. Diffuse atrophic changes are noted. Areas of decreased attenuation are again seen consistent with chronic white matter ischemic change. No findings to suggest acute hemorrhage, acute infarction or space-occupying mass lesion are noted. CT CERVICAL SPINE FINDINGS Seven cervical segments are well visualized.  Vertebral body height is well maintained. Disc space narrowing is noted at C5-6 and C6-7 with associated osteophytic changes. Facet hypertrophic changes are identified. No acute fracture or acute facet abnormality is noted. The odontoid is within normal limits. The surrounding soft tissues show no acute abnormality. IMPRESSION: CT of the head: Chronic atrophic and ischemic changes CT of cervical spine: Multilevel degenerative changes without acute abnormality. Electronically Signed   By: Inez Catalina M.D.   On: 06/16/2015 15:24    EKG: sinus rhythm.   Assessment/Plan Active Problems:   UTI (lower urinary tract infection)   Acute encephalopathy   Acute encephalopathy probably  from urinary tract infection and worsening of his dementia and deconditioning.  Admitted for IV antibiotics and IV fluids and PT eval in the next 48 hours.  Fall precautions .    Hypertension: well controlled.   Diabetes mellitus: SSI while inpatient.    Dementia with slight agitation: Prn haldol.     Code Status: full code.  DVT Prophylaxis: Family Communication:discussed with family at bedside Disposition Plan: pending PT eva.  Time spent: 65 min  [facility] Pager [phone]

## 2015-06-16 NOTE — ED Notes (Signed)
Pt family reporting increased agitation and anxiety. Pt pulling at monitors and becoming hard to console. MD aware.

## 2015-06-16 NOTE — ED Notes (Signed)
Pt here from home with family with c/o increased generalized weakness and increasing confusion. Pt wife reports a restless night with night with pt c/o back pain which son reports he has intermittently complained of since approx 6 weeks ago when he fell in the bathtub. Family expressing concerns that they will not be able to care for him in the home.

## 2015-06-16 NOTE — ED Notes (Signed)
This RN called to give floor report. RN on floor interrupted report to ask a few questions and then states "You can send him up." This RN unable to completely give her full report.

## 2015-06-16 NOTE — ED Provider Notes (Signed)
CSN: [id number]     Arrival date & time 06/16/15  1255 History   First MD Initiated Contact with Patient 06/16/15 1304     Chief Complaint  Patient presents with  . Weakness     (Consider location/radiation/quality/duration/timing/severity/associated sxs/prior Treatment) HPI Reginald Patterson is a 75 y.o. male with history of dementia, diabetes, hypertension, anemia, CVAs, presents to emergency department with his family were concerned about patient's rapid decline in mental status. Patient has dementia,  Has had several CVAs,followed by Dr. Leonie Man. Had a office visit with Dr. Leonie Man 6 weeks ago, at that time family were concerned about patient's worsening gait and mental status. CT scan was obtained and was unremarkable. Family state that patient's symptoms has rapidly worsened in the last week. Patient with worsening weakness, has required more assistance getting up, and now with worsening confusion. Family states that today is when patient finally was unable to even get out of bed. Patient did have a fall several weeks ago and hit his lower back on the bathtub. Had another fall afterwards and injured right side of the chest. Family state that patient was not evaluated for these injuries yet. Patient denies any pain or any complaints. No fever or chills. No recent illnesses. Eating well. Normal bowel movements. Patient does self cath, urine appears dark.  Past Medical History  Diagnosis Date  . Diabetes mellitus   . Hypertension   . Hyperlipidemia   . Anxiety   . Anemia 11-08-12    mild anemia  . Stroke [facility]) 11-08-12    x2 , last 09-29-11(confusion,left side weakness) no residual"some memory problems.  . Chronic kidney disease     must self cath   Past Surgical History  Procedure Laterality Date  . Back surgery      1996- metal plate and screws  . Colonoscopy with propofol N/A 01/12/2013    Procedure: COLONOSCOPY WITH PROPOFOL;  Surgeon: Arta Silence, MD;  Location: [facility];  Service:  Endoscopy;  Laterality: N/A;   Family History  Problem Relation Age of Onset  . Dementia Mother   . Alzheimer's disease Mother   . Stroke Mother   . Lung cancer Father   . Colon cancer Maternal Grandmother    Social History  Substance Use Topics  . Smoking status: Former Smoker -- 1.00 packs/day for 5 years    Types: Cigarettes  . Smokeless tobacco: Former Systems developer    Types: [place] date: 05/26/1962  . Alcohol Use: Yes     Comment: only rarely    Review of Systems  Constitutional: Positive for fatigue. Negative for fever and chills.  Respiratory: Negative for cough, chest tightness and shortness of breath.   Cardiovascular: Negative for chest pain, palpitations and leg swelling.  Gastrointestinal: Negative for nausea, vomiting, abdominal pain, diarrhea and abdominal distention.  Genitourinary: Negative for dysuria, urgency, frequency and hematuria.  Musculoskeletal: Negative for myalgias, arthralgias, neck pain and neck stiffness.  Skin: Negative for rash.  Allergic/Immunologic: Negative for immunocompromised state.  Neurological: Positive for weakness. Negative for dizziness, light-headedness, numbness and headaches.  Psychiatric/Behavioral: Positive for confusion.  All other systems reviewed and are negative.     Allergies  Ciprofloxacin and Vicodin  Home Medications   Prior to Admission medications   Medication Sig Start Date End Date Taking? Authorizing Provider  ALPRAZolam Duanne Moron) 1 MG tablet Take 0.5 mg by mouth 2 (two) times daily as needed for anxiety.     Historical Provider, MD  clopidogrel (PLAVIX) 75  MG tablet Take 1 tablet (75 mg total) by mouth daily. 02/15/15   Garvin Fila, MD  divalproex (DEPAKOTE ER) 500 MG 24 hr tablet Take 1 tablet (500 mg total) by mouth 2 (two) times daily. 05/18/15   Garvin Fila, MD  ferrous sulfate 325 (65 FE) MG tablet Take 325 mg by mouth 2 (two) times daily with a meal.     Historical Provider, MD  ibuprofen  (ADVIL,MOTRIN) 200 MG tablet Take 200 mg by mouth every 6 (six) hours as needed for pain.    Historical Provider, MD  Insulin Glargine (LANTUS SOLOSTAR) 100 UNIT/ML SOPN Inject 35 Units into the skin daily.    Historical Provider, MD  lisinopril (PRINIVIL,ZESTRIL) 5 MG tablet Take 5 mg by mouth every morning.    Historical Provider, MD  metFORMIN (GLUCOPHAGE) 1000 MG tablet Take 1 tablet by mouth 2 (two) times daily. 11/27/14   Historical Provider, MD  Multiple Vitamin (MULTIVITAMIN WITH MINERALS) TABS Take 1 tablet by mouth daily.    Historical Provider, MD  ONE TOUCH ULTRA TEST test strip  01/05/13   Historical Provider, MD  pantoprazole (PROTONIX) 40 MG tablet Take 1 tablet by mouth daily. 11/27/14   Historical Provider, MD  PARoxetine (PAXIL) 40 MG tablet Take 40 mg by mouth at bedtime.     Historical Provider, MD  simvastatin (ZOCOR) 40 MG tablet Take 40 mg by mouth every evening.    Historical Provider, MD  venlafaxine XR (EFFEXOR-XR) 150 MG 24 hr capsule Take 150 mg by mouth 2 (two) times daily.    Historical Provider, MD  Wheat Dextrin (BENEFIBER PO) Take by mouth.    Historical Provider, MD   BP 124/84 mmHg  Pulse 77  Temp(Src) 97.9 F (36.6 C) (Oral)  Resp 24  Ht 5\' 7"  (1.702 m)  Wt 82.555 kg  BMI 28.50 kg/m2  SpO2 96% Physical Exam  Constitutional: He appears well-developed and well-nourished. No distress.  HENT:  Head: Normocephalic and atraumatic.  Eyes: Conjunctivae are normal.  Neck: Normal range of motion. Neck supple.  Cardiovascular: Normal rate, regular rhythm and normal heart sounds.   Pulmonary/Chest: Effort normal and breath sounds normal. No respiratory distress. He has no wheezes. He has no rales. He exhibits tenderness.   Right lower chest wall tenderness  Abdominal: Soft. Bowel sounds are normal. He exhibits no distension. There is no tenderness. There is no rebound.  Musculoskeletal: He exhibits no edema.   No midline thoracic or lumbar spine tenderness.   Neurological: He is alert. No cranial nerve deficit.   Oriented to self , disoriented to place and time. 5/5 and equal upper and lower extremity strength bilaterally. Equal grip strength bilaterally. Normal finger to nose. No pronator drift.    Skin: Skin is warm and dry.  Nursing note and vitals reviewed.   ED Course  Procedures (including critical care time) Labs Review Labs Reviewed  URINALYSIS, ROUTINE W REFLEX MICROSCOPIC (NOT AT [facility]) - Abnormal; Notable for the following:    APPearance TURBID (*)    Hgb urine dipstick MODERATE (*)    Protein, ur 100 (*)    Nitrite POSITIVE (*)    Leukocytes, UA LARGE (*)    All other components within normal limits  CBC WITH DIFFERENTIAL/PLATELET - Abnormal; Notable for the following:    Monocytes Absolute 1.2 (*)    All other components within normal limits  COMPREHENSIVE METABOLIC PANEL - Abnormal; Notable for the following:    Glucose, Bld 121 (*)  Albumin 3.3 (*)    All other components within normal limits  URINE MICROSCOPIC-ADD ON - Abnormal; Notable for the following:    Bacteria, UA FEW (*)    All other components within normal limits  BASIC METABOLIC PANEL - Abnormal; Notable for the following:    Glucose, Bld 229 (*)    All other components within normal limits  GLUCOSE, CAPILLARY - Abnormal; Notable for the following:    Glucose-Capillary 187 (*)    All other components within normal limits  GLUCOSE, CAPILLARY - Abnormal; Notable for the following:    Glucose-Capillary 125 (*)    All other components within normal limits  GLUCOSE, CAPILLARY - Abnormal; Notable for the following:    Glucose-Capillary 164 (*)    All other components within normal limits  I-STAT CG4 LACTIC ACID, ED - Abnormal; Notable for the following:    Lactic Acid, Venous 2.12 (*)    All other components within normal limits  I-STAT CG4 LACTIC ACID, ED - Abnormal; Notable for the following:    Lactic Acid, Venous 2.61 (*)    All other components  within normal limits  CBG MONITORING, ED - Abnormal; Notable for the following:    Glucose-Capillary 120 (*)    All other components within normal limits  URINE CULTURE  CBC  LACTIC ACID, PLASMA  HEMOGLOBIN A1C  I-STAT TROPOININ, ED    Imaging Review Dg Ribs Unilateral W/chest Right  06/16/2015  CLINICAL DATA:  Increase generalized weakness. Back pain status post multiple falls. EXAM: RIGHT RIBS AND CHEST - 3+ VIEW COMPARISON:  03/23/2015 FINDINGS: The cardiac silhouette is normal. Mediastinal contours are prominent which may be due to portable technique and low lung volumes. There is no evidence of focal airspace consolidation, pleural effusion or pneumothorax. There is a minimally displaced fracture of the right eighth posterior lateral rib. Soft tissues are grossly normal. IMPRESSION: Minimally displaced right eighth posterior lateral rib fracture. Prominence of the mediastinal contours. This may represent overlapping vascular markings, exaggerated by low lung volumes and portable technique. Mediastinal lymphadenopathy however cannot be excluded. Electronically Signed   By: Fidela Salisbury M.D.   On: 06/16/2015 14:57   Dg Lumbar Spine Complete  06/16/2015  CLINICAL DATA:  Increased generalized weakness.  Back pain. EXAM: LUMBAR SPINE - COMPLETE 4+ VIEW COMPARISON:  Myelogram of the lumbar spine dated 04/15/2015 FINDINGS: There is no evidence of lumbar spine fracture. Patient status post L5-S1 posterior fusion with grade 2 anterolisthesis of L5 on S1, stable. Vertebral body heights are preserved. Mild multilevel osteoarthritic changes. Atherosclerotic calcifications of the abdominal aorta are noted. IMPRESSION: No evidence of lumbar spine fracture. Stable appearance of L5-S1 posterior fusion with grade 2 anterolisthesis of L5 on S1. Mild multilevel disc and facet degenerative changes. Electronically Signed   By: Fidela Salisbury M.D.   On: 06/16/2015 15:04   Ct Head Wo  Contrast  06/16/2015  CLINICAL DATA:  Increasing weakness and confusion, fall 6 weeks ago, initial encounter EXAM: CT HEAD WITHOUT CONTRAST CT CERVICAL SPINE WITHOUT CONTRAST TECHNIQUE: Multidetector CT imaging of the head and cervical spine was performed following the standard protocol without intravenous contrast. Multiplanar CT image reconstructions of the cervical spine were also generated. COMPARISON:  05/10/2015 FINDINGS: CT HEAD FINDINGS The bony calvarium is intact. Diffuse atrophic changes are noted. Areas of decreased attenuation are again seen consistent with chronic white matter ischemic change. No findings to suggest acute hemorrhage, acute infarction or space-occupying mass lesion are noted. CT CERVICAL SPINE FINDINGS  Seven cervical segments are well visualized. Vertebral body height is well maintained. Disc space narrowing is noted at C5-6 and C6-7 with associated osteophytic changes. Facet hypertrophic changes are identified. No acute fracture or acute facet abnormality is noted. The odontoid is within normal limits. The surrounding soft tissues show no acute abnormality. IMPRESSION: CT of the head: Chronic atrophic and ischemic changes CT of cervical spine: Multilevel degenerative changes without acute abnormality. Electronically Signed   By: Inez Catalina M.D.   On: 06/16/2015 15:24   Ct Cervical Spine Wo Contrast  06/16/2015  CLINICAL DATA:  Increasing weakness and confusion, fall 6 weeks ago, initial encounter EXAM: CT HEAD WITHOUT CONTRAST CT CERVICAL SPINE WITHOUT CONTRAST TECHNIQUE: Multidetector CT imaging of the head and cervical spine was performed following the standard protocol without intravenous contrast. Multiplanar CT image reconstructions of the cervical spine were also generated. COMPARISON:  05/10/2015 FINDINGS: CT HEAD FINDINGS The bony calvarium is intact. Diffuse atrophic changes are noted. Areas of decreased attenuation are again seen consistent with chronic white matter  ischemic change. No findings to suggest acute hemorrhage, acute infarction or space-occupying mass lesion are noted. CT CERVICAL SPINE FINDINGS Seven cervical segments are well visualized. Vertebral body height is well maintained. Disc space narrowing is noted at C5-6 and C6-7 with associated osteophytic changes. Facet hypertrophic changes are identified. No acute fracture or acute facet abnormality is noted. The odontoid is within normal limits. The surrounding soft tissues show no acute abnormality. IMPRESSION: CT of the head: Chronic atrophic and ischemic changes CT of cervical spine: Multilevel degenerative changes without acute abnormality. Electronically Signed   By: Inez Catalina M.D.   On: 06/16/2015 15:24   I have personally reviewed and evaluated these images and lab results as part of my medical decision-making.   EKG Interpretation   Date/Time:  Saturday June 16 2015 13:15:42 EST Ventricular Rate:  89 PR Interval:  144 QRS Duration: 97 QT Interval:  380 QTC Calculation: 462 R Axis:   62 Text Interpretation:  Sinus rhythm Borderline low voltage, extremity leads  No significant change since last tracing Confirmed by LIU MD, DANA [id number])  on 06/16/2015 4:27:12 PM      MDM   Final diagnoses:  UTI (lower urinary tract infection)  Dementia, without behavioral disturbance  Fracture of rib of right side, closed, initial encounter  Weakness     patient with worsening mental status and generalized weakness. Today unable to get out of bed.  no neuro deficits on exam. Patient is alert, vital signs are normal. Will check labs, chest x-ray, CT head.   Labs unremarkable. Urinalysis showing UTI. Will start on rocephin. No evidence of sepsis, normal WBC, afebrile. CT head and cspine negative.  Pt's chest xray showing rib fractures on right. Will admit patient for further workup. Pt may need placement   Spoke with [facility], will admit.   Filed Vitals:   06/16/15 1746 06/16/15 2006  06/17/15 0544 06/17/15 1357  BP: 135/73 112/83 164/93 144/83  Pulse: 98 76 69 74  Temp:  98.7 F (37.1 C) 97.9 F (36.6 C) 98 F (36.7 C)  TempSrc:  Oral Oral Oral  Resp: 26 18 16 16   Height:      Weight:      SpO2: 94% 95% 95% 95%      Jeannett Senior, PA-C 06/17/15 [place], MD 06/18/15 [phone]

## 2015-06-17 DIAGNOSIS — I639 Cerebral infarction, unspecified: Secondary | ICD-10-CM | POA: Diagnosis not present

## 2015-06-17 DIAGNOSIS — R451 Restlessness and agitation: Secondary | ICD-10-CM

## 2015-06-17 DIAGNOSIS — N390 Urinary tract infection, site not specified: Secondary | ICD-10-CM

## 2015-06-17 DIAGNOSIS — Z794 Long term (current) use of insulin: Secondary | ICD-10-CM

## 2015-06-17 DIAGNOSIS — I10 Essential (primary) hypertension: Secondary | ICD-10-CM

## 2015-06-17 DIAGNOSIS — R410 Disorientation, unspecified: Secondary | ICD-10-CM | POA: Diagnosis present

## 2015-06-17 DIAGNOSIS — E118 Type 2 diabetes mellitus with unspecified complications: Secondary | ICD-10-CM

## 2015-06-17 DIAGNOSIS — G9340 Encephalopathy, unspecified: Secondary | ICD-10-CM | POA: Diagnosis not present

## 2015-06-17 HISTORY — DX: Encephalopathy, unspecified: G93.40

## 2015-06-17 LAB — BASIC METABOLIC PANEL
ANION GAP: 8 (ref 5–15)
BUN: 12 mg/dL (ref 6–20)
CHLORIDE: 107 mmol/L (ref 101–111)
CO2: 27 mmol/L (ref 22–32)
Calcium: 9.1 mg/dL (ref 8.9–10.3)
Creatinine, Ser: 0.94 mg/dL (ref 0.61–1.24)
GFR calc Af Amer: >60 mL/min (ref >60)
GLUCOSE: 229 mg/dL — AB (ref 65–99)
POTASSIUM: 4.1 mmol/L (ref 3.5–5.1)
Sodium: 142 mmol/L (ref 135–145)

## 2015-06-17 LAB — GLUCOSE, CAPILLARY
GLUCOSE-CAPILLARY: 164 mg/dL — AB (ref 65–99)
Glucose-Capillary: 125 mg/dL — ABNORMAL HIGH (ref 65–99)
Glucose-Capillary: 139 mg/dL — ABNORMAL HIGH (ref 65–99)
Glucose-Capillary: 136 mg/dL — ABNORMAL HIGH (ref 65–99)

## 2015-06-17 LAB — CBC
HEMATOCRIT: 40 % (ref 39.0–52.0)
HEMOGLOBIN: 13.4 g/dL (ref 13.0–17.0)
MCH: 29.4 pg (ref 26.0–34.0)
MCHC: 33.5 g/dL (ref 30.0–36.0)
MCV: 87.7 fL (ref 78.0–100.0)
Platelets: 256 10*3/uL (ref 150–400)
RBC: 4.56 MIL/uL (ref 4.22–5.81)
RDW: 14.1 % (ref 11.5–15.5)
WBC: 7.3 10*3/uL (ref 4.0–10.5)

## 2015-06-17 LAB — LACTIC ACID, PLASMA: Lactic Acid, Venous: 2 mmol/L (ref 0.5–2.0)

## 2015-06-17 NOTE — Evaluation (Signed)
Physical Therapy Evaluation Patient Details Name: JAYDEEN CORCORAN MRN: [id number] DOB: January 08, 1941 Today's Date: 06/17/2015   History of Present Illness  Ila Mathieu is a 75 year old male with prior h/o stroke DM, hypertension, dementia, CKD stage 2 presents with family today, as he was deteriorating over the last one month both cognitively and physically. But over the last 2 days, pt was not able to get out of bed and he was confused and agitated asper the wife and son at bedside.   Clinical Impression   Pt admitted with above diagnosis. Pt currently with functional limitations due to the deficits listed below (see PT Problem List).  Pt will benefit from skilled PT to increase their independence and safety with mobility to allow discharge to the venue listed below.    Lengthy conversation with Ms. Schoenfelder re: dc planning; Given current status, and that the UTI is treatable and there is a reasonable expectation that Mr. Brandsma can improve overall functional status; SNF for rehab is indicated; Ms. Essa hopes to be able to take him home, and home with familiar environment and routine following rehab is often the most therapeutic place for pt's with dementia; We will continue to follow and updated dc plans as apppropriate     Follow Up Recommendations SNF    Equipment Recommendations  Rolling walker with 5" wheels;3in1 (PT);Wheelchair (measurements PT);Wheelchair cushion (measurements PT)    Recommendations for Other Services OT consult     Precautions / Restrictions Precautions Precautions: Fall      Mobility  Bed Mobility Overal bed mobility: Needs Assistance Bed Mobility: Supine to Sit     Supine to sit: Mod assist     General bed mobility comments: Step-by-step cues, and pt was able to use rails to push to sit; mod assist to complete task of sitting fully upright; tending to lean posteriorly and needing mod assist to reciprocal scoot to EOB  Transfers Overall transfer level:  Needs assistance Equipment used: Rolling walker (2 wheeled) Transfers: Sit to/from Stand Sit to Stand: Mod assist;+2 safety/equipment         General transfer comment: Initiates with posterior lean; needing mod assist to help weright shift forward and power up; Heavy mod assist to control descent to chair  Ambulation/Gait Ambulation/Gait assistance: +2 physical assistance;Mod assist Ambulation Distance (Feet): 6 Feet Assistive device: Rolling walker (2 wheeled) Gait Pattern/deviations: Decreased step length - right;Decreased step length - left;Trunk flexed     General Gait Details: Posterior lean persistsed with all upright activity  including walking; +2 for balance and to stay upright  Stairs            Wheelchair Mobility    Modified Rankin (Stroke Patients Only)       Balance Overall balance assessment: Needs assistance Sitting-balance support: Bilateral upper extremity supported;Feet supported;Feet unsupported Sitting balance-Leahy Scale: Poor   Postural control: Posterior lean Standing balance support: Bilateral upper extremity supported Standing balance-Leahy Scale: Zero                               Pertinent Vitals/Pain Pain Assessment: No/denies pain    Home Living Family/patient expects to be discharged to:: Private residence Living Arrangements: Spouse/significant other Available Help at Discharge: Family;Available 24 hours/day Type of Home: House       Home Layout: One level Home Equipment: Grandfather - 2 wheels;Tub bench      Prior Function Level of Independence: Needs assistance  Gait / Transfers Assistance Needed: beginning about a month ago, he has needed wife and son assist to walk with RW  ADL's / Homemaking Assistance Needed: assist from wife  Comments: wife reports a functional decline over the past month -- may be related to current illness, UTI     Hand Dominance        Extremity/Trunk Assessment   Upper  Extremity Assessment: Defer to OT evaluation           Lower Extremity Assessment: Generalized weakness         Communication   Communication: Other (comment) (slow to answer questions, likely related to dementia)  Cognition Arousal/Alertness: Awake/alert Behavior During Therapy: WFL for tasks assessed/performed Overall Cognitive Status: History of cognitive impairments - at baseline       Memory: Decreased short-term memory (With history of dementia)              General Comments General comments (skin integrity, edema, etc.): Lengthy conversation with Ms. Dilworth re: dc planning; Given current status, and that the UTI is treatable and there is a reasonable expectation that Mr. Omo can improve overall functional status; SNF for rehab is indicated; Ms. Newlon hopes to be able to take him home, and home with familiar environment and routine is often the most therapeutic place for pt's with dementia; We will continue to follow and updated dc plans as apppropriate    Exercises        Assessment/Plan    PT Assessment Patient needs continued PT services  PT Diagnosis Difficulty walking;Generalized weakness   PT Problem List Decreased strength;Decreased range of motion;Decreased activity tolerance;Decreased balance;Decreased mobility;Decreased coordination;Decreased cognition;Decreased knowledge of use of DME;Decreased safety awareness;Decreased knowledge of precautions  PT Treatment Interventions DME instruction;Gait training;Functional mobility training;Therapeutic activities;Therapeutic exercise;Balance training;Neuromuscular re-education;Cognitive remediation;Patient/family education   PT Goals (Current goals can be found in the Care Plan section) Acute Rehab PT Goals Patient Stated Goal: pt did not state; wife hopes to be able to bring him home PT Goal Formulation: With family Time For Goal Achievement: 07/01/15 Potential to Achieve Goals: Good    Frequency Min  3X/week   Barriers to discharge        Co-evaluation               End of Session Equipment Utilized During Treatment: Gait belt Activity Tolerance: Patient tolerated treatment well Patient left: in chair;with call bell/phone within reach;with chair alarm set;with family/visitor present Nurse Communication: Mobility status         Time: [id number] PT Time Calculation (min) (ACUTE ONLY): 40 min   Charges:   PT Evaluation $PT Eval Moderate Complexity: 1 Procedure PT Treatments $Gait Training: 8-22 mins $Therapeutic Activity: 8-22 mins   PT G Codes:        Quin Hoop 06/17/2015, 1:04 PM  Roney Marion, [place] Pager [phone] Office [phone]

## 2015-06-17 NOTE — Progress Notes (Signed)
[facility] Progress Note  Patient: Reginald Patterson [id number]   PCP: Gennette Pac, MD DOB: 18-Jan-1941   DOA: 06/16/2015   DOS: 06/17/2015   Date of Service: the patient was seen and examined on 06/17/2015  Subjective: Patient presented with confusion and agitation. Along with generalized weakness. Currently he mentions symptoms are significantly better and stable. No chest pain no dizziness no lightheadedness. Nutrition: Able to tolerate oral diet Activity: Ambulating in the room Last BM: 06/15/2015  Assessment and Plan: 1. Acute encephalopathy UTI. Patient presents with complains of confusion and agitation. Improvement after starting antibiotics. Following the culture. Physical therapy consultation. Monitor clinically.  2. Prior CVA. No evidence of acute CVA. Continuing home medication.  3. Diabetes mellitus type 2. Continuing home insulin sliding scale.  4. Essential hypertension. Blood pressure stable. We'll monitor closely.   DVT Prophylaxis: subcutaneous Heparin Nutrition: Cardiac diet diabetic diet Advance goals of care discussion: Full code  Brief Summary of Hospitalization:  HPI: As per the H and P dictated on admission, "With prior h/o stroke DM, hypertension, dementia, CKD stage 2 presents with family today, as he was deteriorating over the last one month both cognitively and physically. But over the last 2 days, pt was not able to get out of bed and he was confused and agitated asper the wife and son at bedside.  On arrival to ed he underwent cbc, bmp which were unremarkable, but UA was significant for infection.  CT head does show chronic atrophic changes and ischemic changes.  Wife also reports a fall one week ago and RIB series shows a right eight posterior lateral rib fracture.  He was referred to medical service for admission for UTI and dehydration and possible rehab placement. " Daily update, Procedures: Rapid improvement  06/15/2015 Consultants: None Antibiotics: Anti-infectives    Start     Dose/Rate Route Frequency Ordered Stop   06/17/15 1600  cefTRIAXone (ROCEPHIN) 1 g in dextrose 5 % 50 mL IVPB     1 g 100 mL/hr over 30 Minutes Intravenous Every 24 hours 06/16/15 1831     06/16/15 1500  cefTRIAXone (ROCEPHIN) 1 g in dextrose 5 % 50 mL IVPB     1 g 100 mL/hr over 30 Minutes Intravenous  Once 06/16/15 1457 06/16/15 1613       Family Communication: family was present at bedside, at the time of interview.  Opportunity was given to ask question and all questions were answered satisfactorily.   Disposition:  Expected discharge date: 06/18/2015 Barriers to safe discharge: Improvement in mentation   Intake/Output Summary (Last 24 hours) at 06/17/15 1555 Last data filed at 06/17/15 1527  Gross per 24 hour  Intake    240 ml  Output   1500 ml  Net  -1260 ml   Filed Weights   06/16/15 1301  Weight: 82.555 kg (182 lb)    Objective: Physical Exam: Filed Vitals:   06/16/15 1746 06/16/15 2006 06/17/15 0544 06/17/15 1357  BP: 135/73 112/83 164/93 144/83  Pulse: 98 76 69 74  Temp:  98.7 F (37.1 C) 97.9 F (36.6 C) 98 F (36.7 C)  TempSrc:  Oral Oral Oral  Resp: 26 18 16 16   Height:      Weight:      SpO2: 94% 95% 95% 95%     General: Appear in mild distress, no Rash; Oral Mucosa moist. Cardiovascular: S1 and S2 Present, no Murmur, no JVD Respiratory: Bilateral Air entry present and Clear to Auscultation, no Crackles, no  wheezes Abdomen: Bowel Sound present, Soft and no tenderness Extremities: no Pedal edema, no calf tenderness Neurology: Grossly no focal neuro deficit.  Data Reviewed: CBC:  Recent Labs Lab 06/16/15 1515 06/17/15 0840  WBC 9.3 7.3  NEUTROABS 6.7  --   HGB 14.4 13.4  HCT 43.4 40.0  MCV 87.0 87.7  PLT 275 [id number]   Basic Metabolic Panel:  Recent Labs Lab 06/16/15 1515 06/17/15 0840  NA 141 142  K 4.2 4.1  CL 106 107  CO2 26 27  GLUCOSE 121* 229*  BUN 12  12  CREATININE 0.85 0.94  CALCIUM 9.2 9.1   Liver Function Tests:  Recent Labs Lab 06/16/15 1515  AST 19  ALT 23  ALKPHOS 77  BILITOT 0.5  PROT 6.5  ALBUMIN 3.3*   No results for input(s): LIPASE, AMYLASE in the last 168 hours. No results for input(s): AMMONIA in the last 168 hours.  Cardiac Enzymes: No results for input(s): CKTOTAL, CKMB, CKMBINDEX, TROPONINI in the last 168 hours.  BNP (last 3 results) No results for input(s): BNP in the last 8760 hours.  CBG:  Recent Labs Lab 06/16/15 1833 06/16/15 2015 06/17/15 0634 06/17/15 1119  GLUCAP 120* 187* 125* 164*    Recent Results (from the past 240 hour(s))  Urine culture     Status: None (Preliminary result)   Collection Time: 06/16/15  1:47 PM  Result Value Ref Range Status   Specimen Description URINE, RANDOM  Final   Special Requests NONE  Final   Culture CULTURE REINCUBATED FOR BETTER GROWTH  Final   Report Status PENDING  Incomplete     Studies: No results found.   Scheduled Meds: . cefTRIAXone (ROCEPHIN)  IV  1 g Intravenous Q24H  . clopidogrel  75 mg Oral Daily  . divalproex  500 mg Oral BID  . enoxaparin (LOVENOX) injection  40 mg Subcutaneous Q24H  . ferrous sulfate  325 mg Oral BID WC  . insulin aspart  0-5 Units Subcutaneous QHS  . insulin aspart  0-9 Units Subcutaneous TID WC  . insulin glargine  35 Units Subcutaneous Daily  . pantoprazole  40 mg Oral Daily  . simvastatin  40 mg Oral QPM  . venlafaxine XR  150 mg Oral Q breakfast   Continuous Infusions:  PRN Meds: acetaminophen **OR** acetaminophen, ALPRAZolam, haloperidol lactate, ondansetron **OR** ondansetron (ZOFRAN) IV  Time spent: 30 minutes  Author: Berle Mull, MD [facility] Pager: [phone] 06/17/2015 3:55 PM  If 7PM-7AM, please contact night-coverage at [email or web address], password [facility]

## 2015-06-18 DIAGNOSIS — I639 Cerebral infarction, unspecified: Secondary | ICD-10-CM

## 2015-06-18 DIAGNOSIS — N390 Urinary tract infection, site not specified: Secondary | ICD-10-CM | POA: Diagnosis not present

## 2015-06-18 DIAGNOSIS — R451 Restlessness and agitation: Secondary | ICD-10-CM | POA: Diagnosis not present

## 2015-06-18 DIAGNOSIS — G9340 Encephalopathy, unspecified: Secondary | ICD-10-CM | POA: Diagnosis not present

## 2015-06-18 LAB — CBC WITH DIFFERENTIAL/PLATELET
BASOS ABS: 0 10*3/uL (ref 0.0–0.1)
Basophils Relative: 0 %
EOS ABS: 0.4 10*3/uL (ref 0.0–0.7)
EOS PCT: 5 %
HCT: 40.4 % (ref 39.0–52.0)
Hemoglobin: 13.6 g/dL (ref 13.0–17.0)
LYMPHS ABS: 1.7 10*3/uL (ref 0.7–4.0)
Lymphocytes Relative: 22 %
MCH: 29.3 pg (ref 26.0–34.0)
MCHC: 33.7 g/dL (ref 30.0–36.0)
MCV: 87.1 fL (ref 78.0–100.0)
MONO ABS: 1.1 10*3/uL — AB (ref 0.1–1.0)
Monocytes Relative: 14 %
Neutro Abs: 4.6 10*3/uL (ref 1.7–7.7)
Neutrophils Relative %: 59 %
PLATELETS: 256 10*3/uL (ref 150–400)
RBC: 4.64 MIL/uL (ref 4.22–5.81)
RDW: 14 % (ref 11.5–15.5)
WBC: 7.8 10*3/uL (ref 4.0–10.5)

## 2015-06-18 LAB — BASIC METABOLIC PANEL
Anion gap: 8 (ref 5–15)
BUN: 12 mg/dL (ref 6–20)
CALCIUM: 9.5 mg/dL (ref 8.9–10.3)
CO2: 30 mmol/L (ref 22–32)
Chloride: 106 mmol/L (ref 101–111)
Creatinine, Ser: 0.91 mg/dL (ref 0.61–1.24)
GFR calc Af Amer: >60 mL/min (ref >60)
Glucose, Bld: 130 mg/dL — ABNORMAL HIGH (ref 65–99)
POTASSIUM: 4.1 mmol/L (ref 3.5–5.1)
SODIUM: 144 mmol/L (ref 135–145)

## 2015-06-18 LAB — GLUCOSE, CAPILLARY
GLUCOSE-CAPILLARY: 112 mg/dL — AB (ref 65–99)
Glucose-Capillary: 133 mg/dL — ABNORMAL HIGH (ref 65–99)
Glucose-Capillary: 126 mg/dL — ABNORMAL HIGH (ref 65–99)

## 2015-06-18 LAB — HEMOGLOBIN A1C
HEMOGLOBIN A1C: 6.5 % — AB (ref 4.8–5.6)
MEAN PLASMA GLUCOSE: 140 mg/dL

## 2015-06-18 MED ORDER — CEPHALEXIN 500 MG PO CAPS: 500.0000 mg | ORAL_CAPSULE | Freq: Four times a day (QID) | ORAL | Status: AC

## 2015-06-18 NOTE — Progress Notes (Addendum)
Report called to [facility] rehab 1612 Pt not going to [facility] going to [place] report called

## 2015-06-18 NOTE — Care Management Note (Signed)
Case Management Note  Patient Details  Name: Reginald Patterson MRN: [id number] Date of Birth: [birth year]  Subjective/Objective:           Admitted with acute encephalopathy         Action/Plan: PT recommended SNF, referral was made to [place]. Per CSW patient to be discharge to [facility].  Expected Discharge Date:  06/20/15               Expected Discharge Plan:  [place]  In-House Referral:     Discharge planning Services  CM Consult  Post Acute Care Choice:    Choice offered to:     DME Arranged:    DME Agency:     HH Arranged:    [place] Agency:     Status of Service:  Completed, signed off  Medicare Important Message Given:    Date Medicare IM Given:    Medicare IM give by:    Date Additional Medicare IM Given:    Additional Medicare Important Message give by:     If discussed at [place] of Stay Meetings, dates discussed:    Additional Comments:  Nila Nephew, RN 06/18/2015, 4:22 PM

## 2015-06-18 NOTE — Progress Notes (Signed)
Occupational Therapy Evaluation Patient Details Name: Reginald Patterson MRN: [id number] DOB: 1940/09/03 Today's Date: 06/18/2015    History of Present Illness Reginald Patterson is a 75 year old male with prior h/o stroke DM, hypertension, dementia, CKD stage 2 presents with family today, as he was deteriorating over the last one month both cognitively and physically. But over the last 2 days, pt was not able to get out of bed and he was confused and agitated asper the wife and son at bedside.    Clinical Impression   PTA, pt required assistance with all ADLs and used RW with assistance for mobility. Pt currently requires m0d-max +2 assist for all functional transfers and min-mod assist for ADLs. Pt's wife is now agreeable to SNF for post-acute rehab stay as pt requires more assistance than she is able to provide at home although she is still very worried about doing this - discussed importance of increasing pt's strength and endurance in order to maximize his independence and decrease caregiver burden. Will continue to follow acutely.    Follow Up Recommendations  SNF    Equipment Recommendations  Other (comment) (TBD in next venue)    Recommendations for Other Services       Precautions / Restrictions Precautions Precautions: Fall Restrictions Weight Bearing Restrictions: No      Mobility Bed Mobility Overal bed mobility: Needs Assistance Bed Mobility: Supine to Sit     Supine to sit: Max assist     General bed mobility comments: Provided step-by-step cues for hand placement and leg progression off bed. Max assist for trunk support and to scoot hips to EOB. Max assist (+2 at times from therapist and wife) to maintain upright sitting position.   Transfers Overall transfer level: Needs assistance Equipment used: Rolling walker (2 wheeled) Transfers: Sit to/from [facility] Sit to Stand: Mod assist;+2 physical assistance Stand pivot transfers: Max assist;+2  physical assistance       General transfer comment: Mod-max assist for boost to stand, to maintain balance with severe posterior lean, and to control descent to chair. Verbal and tactile cues for hand placement on RW, step sequence, and safety upon sitting. Pt unable to follow commands to reach back for chair before sitting      Balance Overall balance assessment: Needs assistance Sitting-balance support: Bilateral upper extremity supported;Feet supported Sitting balance-Leahy Scale: Zero Sitting balance - Comments: Bilateral UE support pt required mod-max assist to maintain sitting position despite various cues to lean/reach forward. Postural control: Posterior lean Standing balance support: Bilateral upper extremity supported;During functional activity Standing balance-Leahy Scale: Zero                              ADL Overall ADL's : Needs assistance/impaired                         Toilet Transfer: Maximal assistance;+2 for physical assistance;Stand-pivot;Cueing for sequencing;RW Toilet Transfer Details (indicate cue type and reason): simulated to chair         Functional mobility during ADLs: Maximal assistance;+2 for physical assistance;Rolling walker General ADL Comments: Focus of session on transfers as pt's wife prefers to take pt home. Pt with difficulty sequencing tasks and requried verbal and tactile cues in addition to max +2 assist to complete transfers.     Vision     Perception     Praxis      Pertinent Vitals/Pain Pain  Assessment: No/denies pain     Hand Dominance Right   Extremity/Trunk Assessment Upper Extremity Assessment Upper Extremity Assessment: Overall WFL for tasks assessed   Lower Extremity Assessment Lower Extremity Assessment: Generalized weakness       Communication Communication Communication: Other (comment) (slwo to answer questions - very quiet voice)   Cognition Arousal/Alertness: Awake/alert Behavior  During Therapy: WFL for tasks assessed/performed Overall Cognitive Status: History of cognitive impairments - at baseline       Memory: Decreased short-term memory             General Comments       Exercises       Shoulder Instructions      Home Living Family/patient expects to be discharged to:: Private residence Living Arrangements: Spouse/significant other Available Help at Discharge: Family;Available 24 hours/day Type of Home: House       Home Layout: One level     Bathroom Shower/Tub: Tub/shower unit;Curtain Shower/tub characteristics: Architectural technologist: Standard     Home Equipment: Environmental consultant - 2 wheels;Tub bench          Prior Functioning/Environment Level of Independence: Needs assistance  Gait / Transfers Assistance Needed: RW for mobility and recently has required assist to walk with it. 2 person assist to get in/out of bed. ADL's / Homemaking Assistance Needed: Assist from wife with bathing and dressing - wife also does all IADLs   Comments: wife reports a functional decline over the past month -- may be related to current illness, UTI    OT Diagnosis: Generalized weakness;Cognitive deficits   OT Problem List: Decreased strength;Decreased activity tolerance;Decreased range of motion;Impaired balance (sitting and/or standing);Decreased coordination;Decreased cognition;Decreased safety awareness;Decreased knowledge of use of DME or AE;Decreased knowledge of precautions   OT Treatment/Interventions: Therapeutic exercise;Self-care/ADL training;Energy conservation;DME and/or AE instruction;Therapeutic activities;Cognitive remediation/compensation;Patient/family education;Balance training    OT Goals(Current goals can be found in the care plan section) Acute Rehab OT Goals Patient Stated Goal: Pt did not state - wife hopes for pt to get stronger and to bring him home OT Goal Formulation: With family Time For Goal Achievement: 07/02/15 Potential to  Achieve Goals: Fair ADL Goals Pt Will Perform Grooming: with min assist;sitting Pt Will Perform Upper Body Bathing: with min assist;with caregiver independent in assisting;sitting Pt Will Perform Lower Body Bathing: with min assist;with caregiver independent in assisting;sitting/lateral leans Pt Will Transfer to Toilet: with mod assist;stand pivot transfer;bedside commode Pt Will Perform Toileting - Clothing Manipulation and hygiene: with mod assist;sit to/from stand  OT Frequency: Min 2X/week   Barriers to D/C:            Co-evaluation              End of Session Equipment Utilized During Treatment: Gait belt;Rolling walker Nurse Communication: Mobility status  Activity Tolerance: Patient tolerated treatment well Patient left: in chair;with call bell/phone within reach;with chair alarm set;with family/visitor present   Time: [id number] OT Time Calculation (min): 33 min Charges:  OT General Charges $OT Visit: 1 Procedure OT Evaluation $OT Eval High Complexity: 1 Procedure OT Treatments $Self Care/Home Management : 8-22 mins G-Codes: OT G-codes **NOT FOR INPATIENT CLASS** Functional Assessment Tool Used: clinical judgement Functional Limitation: Self care Self Care Current Status [id number]): At least 60 percent but less than 80 percent impaired, limited or restricted Self Care Goal Status [id number]): At least 40 percent but less than 60 percent impaired, limited or restricted  Redmond Baseman, OTR/L Pager: [phone] 06/18/2015, 11:30 AM

## 2015-06-18 NOTE — NC FL2 (Signed)
[place] LEVEL OF CARE SCREENING TOOL     IDENTIFICATION  Patient Name: Reginald Patterson Birthdate: September 21, 1940 Sex: male Admission Date (Current Location): 06/16/2015  [facility] and [place] Number:  Herbalist and Address:  [facility] [place], [place] [address] [place], [place] [address]      Provider Number: [id number]  Attending Physician Name and Address:  Lavina Hamman, MD  Relative Name and Phone Number:       Current Level of Care: Hospital Recommended Level of Care: [place] Prior Approval Number:    Date Approved/Denied:   PASRR Number: [id number]  Discharge Plan: SNF    Current Diagnoses: Patient Active Problem List   Diagnosis Date Noted  . Encephalopathy acute 06/17/2015  . Acute encephalopathy 06/16/2015  . Leg weakness 02/20/2015  . Agitation 02/20/2015  . Anemia 10/04/2011  . Depression 10/04/2011  . CVA (cerebral infarction) 10/02/2011  . UTI (lower urinary tract infection) 10/02/2011  . HTN (hypertension) 10/02/2011  . Hyperlipidemia 10/02/2011  . Diabetes mellitus, type 2 ([place]) 10/02/2011    Orientation RESPIRATION BLADDER Height & Weight    Self  Normal Incontinent (foley cath) 5\' 7"  (170.2 cm) 182 lbs.  BEHAVIORAL SYMPTOMS/MOOD NEUROLOGICAL BOWEL NUTRITION STATUS         (please see dc summary for dietary needs at time of discharge)  AMBULATORY STATUS COMMUNICATION OF NEEDS Skin   Extensive Assist Verbally Normal                       Personal Care Assistance Level of Assistance              Functional Limitations Info             SPECIAL CARE FACTORS FREQUENCY  PT (By licensed PT), OT (By licensed OT)                    Contractures Contractures Info: Not present    Additional Factors Info  Code Status, Allergies Code Status Info: FULL Allergies Info: Ciprofloxacin, Vicodin           Current Medications (06/18/2015):  This is the current hospital  active medication list Current Facility-Administered Medications  Medication Dose Route Frequency Provider Last Rate Last Dose  . acetaminophen (TYLENOL) tablet 650 mg  650 mg Oral Q6H PRN Hosie Poisson, MD       Or  . acetaminophen (TYLENOL) suppository 650 mg  650 mg Rectal Q6H PRN Hosie Poisson, MD      . ALPRAZolam Duanne Moron) tablet 0.5 mg  0.5 mg Oral BID PRN Hosie Poisson, MD   0.5 mg at 06/16/15 1833  . cefTRIAXone (ROCEPHIN) 1 g in dextrose 5 % 50 mL IVPB  1 g Intravenous Q24H Hosie Poisson, MD   1 g at 06/17/15 1700  . clopidogrel (PLAVIX) tablet 75 mg  75 mg Oral Daily Hosie Poisson, MD   75 mg at 06/18/15 1041  . divalproex (DEPAKOTE ER) 24 hr tablet 500 mg  500 mg Oral BID Hosie Poisson, MD   500 mg at 06/18/15 0844  . enoxaparin (LOVENOX) injection 40 mg  40 mg Subcutaneous Q24H Hosie Poisson, MD   40 mg at 06/17/15 2131  . ferrous sulfate tablet 325 mg  325 mg Oral BID WC Hosie Poisson, MD   325 mg at 06/18/15 0810  . haloperidol lactate (HALDOL) injection 2 mg  2 mg Intravenous Q6H PRN Hosie Poisson, MD  2 mg at 06/18/15 0027  . insulin aspart (novoLOG) injection 0-5 Units  0-5 Units Subcutaneous QHS Hosie Poisson, MD   0 Units at 06/16/15 2230  . insulin aspart (novoLOG) injection 0-9 Units  0-9 Units Subcutaneous TID WC Hosie Poisson, MD   1 Units at 06/17/15 1759  . insulin glargine (LANTUS) injection 35 Units  35 Units Subcutaneous Daily Hosie Poisson, MD   35 Units at 06/18/15 1041  . ondansetron (ZOFRAN) tablet 4 mg  4 mg Oral Q6H PRN Hosie Poisson, MD       Or  . ondansetron (ZOFRAN) injection 4 mg  4 mg Intravenous Q6H PRN Hosie Poisson, MD      . pantoprazole (PROTONIX) EC tablet 40 mg  40 mg Oral Daily Hosie Poisson, MD   40 mg at 06/18/15 1041  . simvastatin (ZOCOR) tablet 40 mg  40 mg Oral QPM Hosie Poisson, MD   40 mg at 06/17/15 1759  . venlafaxine XR (EFFEXOR-XR) 24 hr capsule 150 mg  150 mg Oral Q breakfast Hosie Poisson, MD   150 mg at 06/18/15 [id number]     Discharge  Medications: Please see discharge summary for a list of discharge medications.  Relevant Imaging Results:  Relevant Lab Results:   Additional Information SSN: [id number] *Ready for discharge today*  Farrel Conners, [place], LCSW

## 2015-06-18 NOTE — Clinical Social Work Note (Signed)
CSW received consult for SNF placement.  Patient is projected to discharge today and is OBS status.  Patient's spouse states she is unfamiliar with SNF facilities and was educated on SNF process.  Wife is agreeable to SNF placement as patient is AOX1 at this time.  Wife is also requesting [facility] transportation at time of discharge.  Referrals have been sent to [facility] area and bed offers will be presented to wife.  Disposition: SNF projected dc: today  Nonnie Done, LCSW [phone]  5N1-9; 2S 15-16 and [place] Licensed Clinical Social Worker

## 2015-06-18 NOTE — Clinical Social Work Note (Addendum)
3:32pm- CSW received a call from the son, Shirlean Mylar who reports his sister(Lauren Brigitte Pulse) Sales executive and is on the board of home health care companies in [place] and is refusing for patient to be discharged to [facility].  CSW gave son list of 4 facilities which were able to make bed offer.  CSW reviewed these bed offers with sister, Ander Purpura who states she would rather patient receive STR at [facility].  CSW contacted [facility] SNF who is agreeable to admission today.  Son to complete paperwork at 4:15pm  Patient will discharge today per MD order. Patient will discharge to [facility] RN to call report prior to transportation to: [phone] Transportation: [facility]  1:39pm- CSW spoke with wife and son, Shirlean Mylar at bedside to review bed offers.  [facility] for [facility].  Liaison met with family and patient at bedside and is agreeable to admission today.  CSW sent discharge summary to SNF for review.    Nonnie Done, LCSW [phone]  5N1-9, 2S 15-16 and Psychiatric Service Line  Licensed Clinical Social Worker

## 2015-06-18 NOTE — Discharge Summary (Signed)
[facility] Discharge Summary   Patient: Reginald Patterson [id number]   PCP: Gennette Pac, MD DOB: 02-10-1941   Date of admission: 06/16/2015   Date of discharge: 06/18/2015    Discharge Diagnoses:  Principal Problem:   Acute encephalopathy Active Problems:   CVA (cerebral infarction)   UTI (lower urinary tract infection)   HTN (hypertension)   Hyperlipidemia   Diabetes mellitus, type 2 (HCC)   Agitation   Encephalopathy acute   Recommendations for Outpatient Follow-up:  1. Follow up with PCP in 1 week   Follow-up Information    Follow up with Gennette Pac, MD. Schedule an appointment as soon as possible for a visit in 1 week.   Specialty:  Family Medicine   Contact information:   [place] [phone]       Diet recommendation: Heart healthy carb modified diet  Activity: The patient is advised to gradually reintroduce usual activities.  Discharge Condition: good  History of present illness: As per the H and P dictated on admission, "Reginald Patterson is a 75 y.o. male  With prior h/o stroke DM, hypertension, dementia, CKD stage 2 presents with family today, as he was deteriorating over the last one month both cognitively and physically. But over the last 2 days, pt was not able to get out of bed and he was confused and agitated asper the wife and son at bedside.  On arrival to ed he underwent cbc, bmp which were unremarkable, but UA was significant for infection.  CT head does show chronic atrophic changes and ischemic changes.  Wife also reports a fall one week ago and RIB series shows a right eight posterior lateral rib fracture.  He was referred to medical service for admission for UTI and dehydration and possible rehab placement. "  Hospital Course:  Summary of his active problems in the hospital is as following.  1. Acute encephalopathy UTI. Patient presents with complains of confusion and  agitation. Improvement after starting antibiotics. Urine culture negative. Discharge on oral keflex to complete a 5 days treatment.  2. Prior CVA. No evidence of acute CVA. CT head no acute abnormalities. Continuing home medication.  3. Diabetes mellitus type 2. Continuing home insulin.  4. Essential hypertension. Blood pressure stable.  All other chronic medical condition were stable during the hospitalization.  Patient was seen by physical therapy, who recommended SNF, which was arranged by Education officer, museum and case Freight forwarder. On the day of the discharge the patient's agitation resolved, and no other acute medical condition were reported by patient. the patient was felt safe to be discharge at SNF with physical therapy.  Procedures and Results:  None   Consultations:  None  DISCHARGE MEDICATION: Current Discharge Medication List    START taking these medications   Details  cephALEXin (KEFLEX) 500 MG capsule Take 1 capsule (500 mg total) by mouth 4 (four) times daily. Qty: 10 capsule, Refills: 0      CONTINUE these medications which have NOT CHANGED   Details  ALPRAZolam (XANAX) 1 MG tablet Take 0.5 mg by mouth 2 (two) times daily as needed for anxiety.     clopidogrel (PLAVIX) 75 MG tablet Take 1 tablet (75 mg total) by mouth daily. Qty: 90 tablet, Refills: 3    divalproex (DEPAKOTE ER) 500 MG 24 hr tablet Take 1 tablet (500 mg total) by mouth 2 (two) times daily. Qty: 30 tablet, Refills: 3    ferrous sulfate 325 (65 FE) MG tablet  Take 325 mg by mouth 2 (two) times daily with a meal.     ibuprofen (ADVIL,MOTRIN) 200 MG tablet Take 200 mg by mouth every 6 (six) hours as needed for pain.    Insulin Glargine (LANTUS SOLOSTAR) 100 UNIT/ML SOPN Inject 35 Units into the skin daily.    lisinopril (PRINIVIL,ZESTRIL) 5 MG tablet Take 5 mg by mouth every morning.    metFORMIN (GLUCOPHAGE) 1000 MG tablet Take 1 tablet by mouth 2 (two) times daily.    Multiple Vitamin  (MULTIVITAMIN WITH MINERALS) TABS Take 1 tablet by mouth daily.    ONE TOUCH ULTRA TEST test strip     pantoprazole (PROTONIX) 40 MG tablet Take 1 tablet by mouth daily.    PARoxetine (PAXIL) 40 MG tablet Take 40 mg by mouth at bedtime.     simvastatin (ZOCOR) 40 MG tablet Take 40 mg by mouth every evening.    venlafaxine XR (EFFEXOR-XR) 150 MG 24 hr capsule Take 150 mg by mouth 2 (two) times daily.     Wheat Dextrin (BENEFIBER PO) Take by mouth.       Allergies  Allergen Reactions  . Ciprofloxacin Rash  . Vicodin [Hydrocodone-Acetaminophen] Rash   Discharge Instructions    Diet - low sodium heart healthy    Complete by:  As directed      Discharge instructions    Complete by:  As directed   It is important that you read following instructions as well as go over your medication list with RN to help you understand your care after this hospitalization.  Discharge Instructions: Follow up with PCP in 1 week.   Please request your primary care physician to go over all Hospital Tests and Procedure/Radiological results at the follow up,  Please get all Hospital records sent to your PCP by signing hospital release before you go home.   You were cared for by a hospitalist during your hospital stay. If you have any questions about your discharge medications or the care you received while you were in the hospital after you are discharged, you can call the unit and ask to speak with the hospitalist on call if the hospitalist that took care of you is not available.  Once you are discharged, your primary care physician will handle any further medical issues. Please note that NO REFILLS for any discharge medications will be authorized once you are discharged, as it is imperative that you return to your primary care physician (or establish a relationship with a primary care physician if you do not have one) for your aftercare needs so that they can reassess your need for medications and monitor  your lab values. You Must read complete instructions/literature along with all the possible adverse reactions/side effects for all the Medicines you take and that have been prescribed to you. Take any new Medicines after you have completely understood and accept all the possible adverse reactions/side effects. Wear Seat belts while driving. If you have smoked or chewed Tobacco in the last 2 yrs please stop smoking and/or stop any Recreational drug use.     Increase activity slowly    Complete by:  As directed           Discharge Exam: Filed Weights   06/16/15 1301  Weight: 82.555 kg (182 lb)   Filed Vitals:   06/17/15 2113 06/18/15 0733  BP: 143/85 164/79  Pulse: 80 68  Temp: 98.7 F (37.1 C) 98.9 F (37.2 C)  Resp: 16    General:  Appear in no distress, no Rash; Oral Mucosa moist. Cardiovascular: S1 and S2 Present, no Murmur, no JVD Respiratory: Bilateral Air entry present and Clear to Auscultation, no Crackles, no wheezes Abdomen: Bowel Sound present, Soft and no tenderness Extremities: no Pedal edema, no calf tenderness Neurology: Grossly no focal neuro deficit.  The results of significant diagnostics from this hospitalization (including imaging, microbiology, ancillary and laboratory) are listed below for reference.    Significant Diagnostic Studies: Dg Ribs Unilateral W/chest Right  06/16/2015  CLINICAL DATA:  Increase generalized weakness. Back pain status post multiple falls. EXAM: RIGHT RIBS AND CHEST - 3+ VIEW COMPARISON:  03/23/2015 FINDINGS: The cardiac silhouette is normal. Mediastinal contours are prominent which may be due to portable technique and low lung volumes. There is no evidence of focal airspace consolidation, pleural effusion or pneumothorax. There is a minimally displaced fracture of the right eighth posterior lateral rib. Soft tissues are grossly normal. IMPRESSION: Minimally displaced right eighth posterior lateral rib fracture. Prominence of the  mediastinal contours. This may represent overlapping vascular markings, exaggerated by low lung volumes and portable technique. Mediastinal lymphadenopathy however cannot be excluded. Electronically Signed   By: Fidela Salisbury M.D.   On: 06/16/2015 14:57   Dg Lumbar Spine Complete  06/16/2015  CLINICAL DATA:  Increased generalized weakness.  Back pain. EXAM: LUMBAR SPINE - COMPLETE 4+ VIEW COMPARISON:  Myelogram of the lumbar spine dated 04/15/2015 FINDINGS: There is no evidence of lumbar spine fracture. Patient status post L5-S1 posterior fusion with grade 2 anterolisthesis of L5 on S1, stable. Vertebral body heights are preserved. Mild multilevel osteoarthritic changes. Atherosclerotic calcifications of the abdominal aorta are noted. IMPRESSION: No evidence of lumbar spine fracture. Stable appearance of L5-S1 posterior fusion with grade 2 anterolisthesis of L5 on S1. Mild multilevel disc and facet degenerative changes. Electronically Signed   By: Fidela Salisbury M.D.   On: 06/16/2015 15:04   Ct Head Wo Contrast  06/16/2015  CLINICAL DATA:  Increasing weakness and confusion, fall 6 weeks ago, initial encounter EXAM: CT HEAD WITHOUT CONTRAST CT CERVICAL SPINE WITHOUT CONTRAST TECHNIQUE: Multidetector CT imaging of the head and cervical spine was performed following the standard protocol without intravenous contrast. Multiplanar CT image reconstructions of the cervical spine were also generated. COMPARISON:  05/10/2015 FINDINGS: CT HEAD FINDINGS The bony calvarium is intact. Diffuse atrophic changes are noted. Areas of decreased attenuation are again seen consistent with chronic white matter ischemic change. No findings to suggest acute hemorrhage, acute infarction or space-occupying mass lesion are noted. CT CERVICAL SPINE FINDINGS Seven cervical segments are well visualized. Vertebral body height is well maintained. Disc space narrowing is noted at C5-6 and C6-7 with associated osteophytic changes.  Facet hypertrophic changes are identified. No acute fracture or acute facet abnormality is noted. The odontoid is within normal limits. The surrounding soft tissues show no acute abnormality. IMPRESSION: CT of the head: Chronic atrophic and ischemic changes CT of cervical spine: Multilevel degenerative changes without acute abnormality. Electronically Signed   By: Inez Catalina M.D.   On: 06/16/2015 15:24   Ct Cervical Spine Wo Contrast  06/16/2015  CLINICAL DATA:  Increasing weakness and confusion, fall 6 weeks ago, initial encounter EXAM: CT HEAD WITHOUT CONTRAST CT CERVICAL SPINE WITHOUT CONTRAST TECHNIQUE: Multidetector CT imaging of the head and cervical spine was performed following the standard protocol without intravenous contrast. Multiplanar CT image reconstructions of the cervical spine were also generated. COMPARISON:  05/10/2015 FINDINGS: CT HEAD FINDINGS The bony calvarium is intact.  Diffuse atrophic changes are noted. Areas of decreased attenuation are again seen consistent with chronic white matter ischemic change. No findings to suggest acute hemorrhage, acute infarction or space-occupying mass lesion are noted. CT CERVICAL SPINE FINDINGS Seven cervical segments are well visualized. Vertebral body height is well maintained. Disc space narrowing is noted at C5-6 and C6-7 with associated osteophytic changes. Facet hypertrophic changes are identified. No acute fracture or acute facet abnormality is noted. The odontoid is within normal limits. The surrounding soft tissues show no acute abnormality. IMPRESSION: CT of the head: Chronic atrophic and ischemic changes CT of cervical spine: Multilevel degenerative changes without acute abnormality. Electronically Signed   By: Inez Catalina M.D.   On: 06/16/2015 15:24    Microbiology: Recent Results (from the past 240 hour(s))  Urine culture     Status: None (Preliminary result)   Collection Time: 06/16/15  1:47 PM  Result Value Ref Range Status    Specimen Description URINE, RANDOM  Final   Special Requests NONE  Final   Culture CULTURE REINCUBATED FOR BETTER GROWTH  Final   Report Status PENDING  Incomplete     Labs: CBC:  Recent Labs Lab 06/16/15 1515 06/17/15 0840 06/18/15 0510  WBC 9.3 7.3 7.8  NEUTROABS 6.7  --  4.6  HGB 14.4 13.4 13.6  HCT 43.4 40.0 40.4  MCV 87.0 87.7 87.1  PLT 275 256 [id number]   Basic Metabolic Panel:  Recent Labs Lab 06/16/15 1515 06/17/15 0840 06/18/15 0510  NA 141 142 144  K 4.2 4.1 4.1  CL 106 107 106  CO2 26 27 30   GLUCOSE 121* 229* 130*  BUN 12 12 12   CREATININE 0.85 0.94 0.91  CALCIUM 9.2 9.1 9.5   Liver Function Tests:  Recent Labs Lab 06/16/15 1515  AST 19  ALT 23  ALKPHOS 77  BILITOT 0.5  PROT 6.5  ALBUMIN 3.3*   No results for input(s): LIPASE, AMYLASE in the last 168 hours. No results for input(s): AMMONIA in the last 168 hours. Cardiac Enzymes: No results for input(s): CKTOTAL, CKMB, CKMBINDEX, TROPONINI in the last 168 hours. BNP (last 3 results) No results for input(s): BNP in the last 8760 hours. CBG:  Recent Labs Lab 06/17/15 0634 06/17/15 1119 06/17/15 1624 06/17/15 2118 06/18/15 0737  GLUCAP 125* 164* 139* 136* 112*   Time spent: 30 minutes  Signed:  Charolette Bultman  [facility] 06/18/2015, 11:21 AM

## 2015-06-18 NOTE — Progress Notes (Signed)
Physical Therapy Note  (Late entry for G Code correction)   Jun 21, 2015 1100  PT G-Codes **NOT FOR INPATIENT CLASS**  Functional Assessment Tool Used Clinical Judgement  Functional Limitation Mobility: Walking and moving around  Mobility: Walking and Moving Around Current Status [id number]) CK  Mobility: Walking and Moving Around Goal Status [id number]) CI   Roney Marion, [place] Pager [phone] Office [phone]

## 2015-06-18 NOTE — Care Management Obs Status (Signed)
[place] NOTIFICATION   Patient Details  Name: MEIKHI FLORO MRN: [id number] Date of Birth: 11-24-1940   Medicare Observation Status Notification Given:  Yes    Nila Nephew, RN 06/18/2015, 2:15 PM

## 2015-06-19 LAB — URINE CULTURE

## 2015-06-21 ENCOUNTER — Non-Acute Institutional Stay: Payer: Medicare Other | Admitting: Student

## 2015-06-21 DIAGNOSIS — E785 Hyperlipidemia, unspecified: Secondary | ICD-10-CM | POA: Diagnosis not present

## 2015-06-21 DIAGNOSIS — F32A Depression, unspecified: Secondary | ICD-10-CM

## 2015-06-21 DIAGNOSIS — R296 Repeated falls: Secondary | ICD-10-CM | POA: Diagnosis not present

## 2015-06-21 DIAGNOSIS — N138 Other obstructive and reflux uropathy: Secondary | ICD-10-CM

## 2015-06-21 DIAGNOSIS — F03918 Unspecified dementia, unspecified severity, with other behavioral disturbance: Secondary | ICD-10-CM

## 2015-06-21 DIAGNOSIS — R32 Unspecified urinary incontinence: Secondary | ICD-10-CM

## 2015-06-21 DIAGNOSIS — F329 Major depressive disorder, single episode, unspecified: Secondary | ICD-10-CM

## 2015-06-21 DIAGNOSIS — S2231XS Fracture of one rib, right side, sequela: Secondary | ICD-10-CM

## 2015-06-21 DIAGNOSIS — Z794 Long term (current) use of insulin: Secondary | ICD-10-CM

## 2015-06-21 DIAGNOSIS — E118 Type 2 diabetes mellitus with unspecified complications: Secondary | ICD-10-CM

## 2015-06-21 DIAGNOSIS — G8929 Other chronic pain: Secondary | ICD-10-CM

## 2015-06-21 DIAGNOSIS — N401 Enlarged prostate with lower urinary tract symptoms: Secondary | ICD-10-CM

## 2015-06-21 DIAGNOSIS — M545 Low back pain: Secondary | ICD-10-CM

## 2015-06-21 DIAGNOSIS — F411 Generalized anxiety disorder: Secondary | ICD-10-CM | POA: Diagnosis not present

## 2015-06-21 DIAGNOSIS — I10 Essential (primary) hypertension: Secondary | ICD-10-CM

## 2015-06-21 DIAGNOSIS — F0391 Unspecified dementia with behavioral disturbance: Secondary | ICD-10-CM

## 2015-06-21 DIAGNOSIS — I639 Cerebral infarction, unspecified: Secondary | ICD-10-CM

## 2015-06-21 NOTE — Assessment & Plan Note (Addendum)
Per daughter Reginald Patterson, he has been depressed for many years and is a "negative person".   - Will continue Paxil and Effexor for depression for now - will consider uptitrating antidepressant to effect - Discuss with family trial off of one of the antidepresants.  Would prefer stopping Paxil given anticholinergic effect in older adults.  Continue effexor for analgesic effect

## 2015-06-21 NOTE — Assessment & Plan Note (Addendum)
Last lipid panel 09/2011 - on simvastatin 40 mg qD, consider transition to high intensity statin given history of CVA Discuss change to high potency statin for patient with family to maximize secondary CV event prevention

## 2015-06-21 NOTE — Assessment & Plan Note (Addendum)
Last A1c, 06/16/2015, 6.5 on metformin 1000 BID and Lantus 35 qD - continue lantus 35 u, metformin 100 BID - continue to follow blood sugars - May suggest decrease Lantus dose if CBGs at HL continue to show very good glycemic control

## 2015-06-21 NOTE — Assessment & Plan Note (Signed)
Continue Lisinopril.  - Will follow blood pressures weekly

## 2015-06-21 NOTE — Assessment & Plan Note (Signed)
-   Continue ferrous sulfate 

## 2015-06-21 NOTE — Assessment & Plan Note (Addendum)
right posterior frontal lobe infarct 2013 followed by Dr Leonie Man (neuro). Two strokes 2-3 years ago. Continued on Plavix

## 2015-06-21 NOTE — Progress Notes (Signed)
[place]  Visit  Primary Care Provider: Hulan Fess Location of Care: [facility] Visit Information: Nursing Home admission  Chief Complaint: None  Nursing Concerns: Occasionally agitated at night  Nutrition Concerns: None  Wound Care Nurse Concerns: None   If SNF admission, patient's goal for the rehabilitation admission:  Goals identified by the patient:;  None identified    Family Goals: To be able to start walking again  If SNF admission, discharge disposition goals:  remain at home   HISTORY OF PRESENT ILLNESS:  75 y/o male who was admitted to the hospital for worsening confusion and agitation for 2 days. Past medical history is significant for history of stroke, diabetes, hypertension, dementia, CKD stage II. In the emergency room he was found to have a urinary tract infection with acute encephalopathy and was started on IV antibiotics.  Significantly prior to run to the hospital he had a fall and on imaging he was shown to have an right eighth rib posterior rib fracture. He was evaluated by physical therapy hospital and the decision was made to proceed to skilled nursing facility. For UTI he was initially managed with Rocephin in the hospital then discharged to continue a course of Keflex, however there was no clear instructions for how long they wanted the antibiotics to continue. Urine cultures in the hospital showed pansensitive staph aureous. Blood cultures were not obtained.  Prior to discharge, per the discharge summary, his agitation had resolved and he was felt to be safe for discharge to SNF. On physical therapy evaluation in the hospital, the goal was SNF for rehabilitation with transition to home after completion of that.  Per family his cognition has been declining more abruptly for the last several months. He was diagnosed with dementia 3 years ago. He has increasingly been difficult for his wife, his primary care taker, to care for him. He is  often verbally abusive and has been falling. When he falls it is difficult for his wife to lift him from the floor. He typically falls when trying to go to the restroom, however he does have an indwelling catheter   Outpatient Encounter Prescriptions as of 06/21/2015  Medication Sig  . clopidogrel (PLAVIX) 75 MG tablet Take 1 tablet (75 mg total) by mouth daily.  . divalproex (DEPAKOTE ER) 500 MG 24 hr tablet Take 1 tablet (500 mg total) by mouth 2 (two) times daily.  . ferrous sulfate 325 (65 FE) MG tablet Take 325 mg by mouth 2 (two) times daily with a meal.   . ibuprofen (ADVIL,MOTRIN) 200 MG tablet Take 200 mg by mouth every 6 (six) hours as needed for pain.  . Insulin Glargine (LANTUS SOLOSTAR) 100 UNIT/ML SOPN Inject 35 Units into the skin daily.  Marland Kitchen lisinopril (PRINIVIL,ZESTRIL) 5 MG tablet Take 5 mg by mouth every morning.  . metFORMIN (GLUCOPHAGE) 1000 MG tablet Take 1 tablet by mouth 2 (two) times daily.  . Multiple Vitamin (MULTIVITAMIN WITH MINERALS) TABS Take 1 tablet by mouth daily.  . ONE TOUCH ULTRA TEST test strip   . pantoprazole (PROTONIX) 40 MG tablet Take 1 tablet by mouth daily.  Marland Kitchen PARoxetine (PAXIL) 40 MG tablet Take 40 mg by mouth at bedtime.   . simvastatin (ZOCOR) 40 MG tablet Take 40 mg by mouth every evening.  . venlafaxine XR (EFFEXOR-XR) 150 MG 24 hr capsule Take 150 mg by mouth 2 (two) times daily.   . Wheat Dextrin (BENEFIBER PO) Take by mouth.  . [DISCONTINUED] ALPRAZolam Duanne Moron)  1 MG tablet Take 0.5 mg by mouth 2 (two) times daily as needed for anxiety.    No facility-administered encounter medications on file as of 06/21/2015.   Allergies  Allergen Reactions  . Ciprofloxacin Rash  . Vicodin [Hydrocodone-Acetaminophen] Rash   History Patient Active Problem List   Diagnosis Date Noted  . Dementia 06/22/2015  . Anxiety state 06/22/2015  . Falls frequently 06/22/2015  . Incontinence 06/22/2015  . Encephalopathy acute 06/17/2015  . Acute encephalopathy  06/16/2015  . Leg weakness 02/20/2015  . Agitation 02/20/2015  . Anemia 10/04/2011  . Depression 10/04/2011  . CVA (cerebral infarction) 10/02/2011  . UTI (lower urinary tract infection) 10/02/2011  . HTN (hypertension) 10/02/2011  . Hyperlipidemia 10/02/2011  . Diabetes mellitus, type 2 ([place]) 10/02/2011   Past Medical History  Diagnosis Date  . Diabetes mellitus   . Hypertension   . Hyperlipidemia   . Anxiety   . Anemia 11-08-12    mild anemia  . Stroke [facility]) 11-08-12    x2 , last 09-29-11(confusion,left side weakness) no residual"some memory problems.  . Chronic kidney disease     must self cath   Past Surgical History  Procedure Laterality Date  . Back surgery      1996- metal plate and screws  . Colonoscopy with propofol N/A 01/12/2013    Procedure: COLONOSCOPY WITH PROPOFOL;  Surgeon: Arta Silence, MD;  Location: [facility];  Service: Endoscopy;  Laterality: N/A;   Family History  Problem Relation Age of Onset  . Dementia Mother   . Alzheimer's disease Mother   . Stroke Mother   . Lung cancer Father   . Colon cancer Maternal Grandmother     reports that he has quit smoking. His smoking use included Cigarettes. He has a 5 pack-year smoking history. He quit smokeless tobacco use about 53 years ago. His smokeless tobacco use included Chew. He reports that he drinks alcohol. He reports that he does not use illicit drugs.  Basic Activities of Daily Living   ADLs Independent Needs Assistance Dependent  Bathing  x   Dressing  x   Ambulation   x  Toileting  x   Eating x       Instrumental Activities of Daily Living  IADL Independent Needs Assistance Dependent  Cooking   x  Housework   x  Manage Medications   x  Manage the telephone   x  Shopping for food, clothes, Meds, etc   x  Use transportation   x  Manage Finances   x    Falls in the past six months:   yes  Diet:  Diabetic   Nutritional Supplements:  None  Review of Systems  Patient has ability  to communicate answers to ROS: yes, but with limited ability to comprehend questions See HPI  Geriatric Syndromes: Constipation no ,  Incontinence no  Dizziness no   Syncope no   Skin problems no   Visual Impairment no   Hearing impairment no  Eating impairment no  Impaired Memory or Cognition yes   Behavioral problems yes   Sleep problems yes   Weight loss no    Pain:  Denies pain  Dyspnea: Denies  General: Denies fevers, chills, progressive fatigue, weight gain.  Eyes: Denies pain, blurred vision  Ears/Nose/Throat: Denies ear pain, throat pain, rhinorrhea, nasal congestion.  Cardiovascular: Denies chest pains, palpitations, dyspnea on exertion, orthopnea, peripheral edema.  Respiratory: Denies cough, sputum, dyspnea  Gastrointestinal: Denies abdominal pain, bloating, constipation, diarrhea.  Genitourinary: Denies dysuria, urinary frequency, discharge Musculoskeletal: Denies joint pain, swelling, weakness.  Skin: Denies skin rash or ulcers. Neurologic: Denies transient paralysis, weakness, paresthesias, headache.  Psychiatric: Reports depression, anxiety, psychosis. Endocrine: Denies weight loss   PHYSICAL EXAM:. Wt Readings from Last 3 Encounters:  06/16/15 182 lb (82.555 kg)  05/04/15 182 lb (82.555 kg)  03/08/15 182 lb (82.555 kg)   Temp Readings from Last 3 Encounters:  06/18/15 98.9 F (37.2 C) Oral  12/13/13 97 F (36.1 C) Oral  03/17/13 98.2 F (36.8 C) Oral   BP Readings from Last 3 Encounters:  06/18/15 164/79  05/04/15 117/74  04/17/15 119/57   Pulse Readings from Last 3 Encounters:  06/18/15 68  05/04/15 94  04/17/15 63    General: alert, well nourished, pleasant, clean, groomed HEENT:  No scleral icterus, no nasal secretions, EACs not occluded, TMs clear bilat, Oromucosa moist and no erythema or lesion Neck:  Supple, No JVD, no lymphadenopathy CV:  RRR, no murmur, no ankle swelling RESP: No resp distress or accessory muscle use.  Clear to  ausc bilat. No wheezing, no rales, no rhonchi.  ABD:  Soft, Non-tender, non-distended, +bowel sounds, no masses MSK:  No back pain, no joint pain.  No joint swelling or redness EXT: Warm and well perfused   no edema, no erythema, pulses WNL Gait:  Not tested Skin: no rashes or lesions, scattered areas of bruising over bilateral forearms Neurologic:;  Muscle Tone within normal limits;  Psych:  Orientation to person, not to time or place, Memory global memory impairment noted; Mood appropriate; Speech normal;  Thought Coherent    MMSE - Mini Mental State Exam 05/04/2015  Orientation to time 4  Orientation to Place 4  Registration 3  Attention/ Calculation 0  Recall 2  Language- name 2 objects 2  Language- repeat 1  Language- follow 3 step command 3  Language- read & follow direction 1  Write a sentence 1  Copy design 0  Total score 21   No flowsheet data found.  MiniCog: 0 PHQ 2- 5 Years of Education: High school  Assessment and Plan:   See Problem List for individual problem's assessment and plans.    HTN (hypertension) Continue Lisinopril.  - Will follow blood pressures weekly  Diabetes mellitus, type 2 (HCC) Last A1c, 06/16/2015, 6.5 on metformin 100 BID and Lantus 35 qD - continue lantus 35 u, metformin 100 BID - continue to follow blood sugars  CVA (cerebral infarction) Two strokes 2-3 years ago. Continued on Plavix  Anemia Continue ferrous sulfate  Depression Per daughter Eden Emms, he has been depressed for many years and is a "negative person".   - Will continue Paxil and Effexor for depression for now - will consider uptitrating antidepressant to effect  Hyperlipidemia Last lipid panel 09/2011 - on simvastatin 40 mg qD, consider transition to high intensity statin given history of CVA    Dementia Advanced dementia with agitation managed with depakote. He does occassionally suffer from insomnia associated. Speaking to Patient's daughters Lattie Haw and  Margarita Grizzle, they desire to have a palliative care consult.   - Will continue depakote - Melatonin 3 mg qHS - Palliative care consult  Anxiety state Currently managed with Xanax PRN - he has not gotten this yet in the hospital, given his age and risk associated with benzodiazepines, will d/c this now  Falls frequently Per family he has frequent falls.  - Will maintain falls precautions   Incontinence Has been using catheter for several  years due to incontinence. His wife was not able to change him and has been using the catheter. However, given risk of urinary tract infection with indwelling catheter will stop the catheter now.     Family communications: Eden Emms, Willia Craze   Advanced Directives (MOST form, Living Will, HCPOA): Wife Code Status:   DNR Intubation Status: DNI  Intravenous Fluids:  none Feeding Tubes: none Antibiotics: none Hospitalization: none Emergency contact:  Eden Emms   Follow Up:  Next 30 days unless acute issues arise.      Kaya Pottenger A. Lincoln Brigham MD, [facility] Resident PGY-2 Pager [phone]

## 2015-06-22 ENCOUNTER — Telehealth: Payer: Self-pay | Admitting: Family Medicine

## 2015-06-22 DIAGNOSIS — F0390 Unspecified dementia without behavioral disturbance: Secondary | ICD-10-CM

## 2015-06-22 DIAGNOSIS — R32 Unspecified urinary incontinence: Secondary | ICD-10-CM

## 2015-06-22 DIAGNOSIS — F411 Generalized anxiety disorder: Secondary | ICD-10-CM | POA: Insufficient documentation

## 2015-06-22 DIAGNOSIS — R296 Repeated falls: Secondary | ICD-10-CM | POA: Insufficient documentation

## 2015-06-22 DIAGNOSIS — F0150 Vascular dementia without behavioral disturbance: Secondary | ICD-10-CM | POA: Insufficient documentation

## 2015-06-22 DIAGNOSIS — I7090 Unspecified atherosclerosis: Secondary | ICD-10-CM

## 2015-06-22 HISTORY — DX: Unspecified urinary incontinence: R32

## 2015-06-22 HISTORY — DX: Repeated falls: R29.6

## 2015-06-22 HISTORY — DX: Unspecified dementia, unspecified severity, without behavioral disturbance, psychotic disturbance, mood disturbance, and anxiety: F03.90

## 2015-06-22 HISTORY — DX: Generalized anxiety disorder: F41.1

## 2015-06-22 NOTE — Assessment & Plan Note (Addendum)
Has been using catheter for several years due to incontinence. His wife was not able to change him and has been using the catheter. However, given risk of urinary tract infection with indwelling catheter will stop the catheter now. Will discuss with family a voiding trial

## 2015-06-22 NOTE — Assessment & Plan Note (Addendum)
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  FAST Stage:Six given need for assistance with some ADLs  memory loss and inattention decreased attention, decreased problem solving, decreased safety awareness and decreased memory Palliative Performance Score: 50-60%.  Behavioral and Psychological Symptoms of Dementia: intermittent agitation and angry events Pharmacologic treatments of patient: Valproic acid.  Response: Good per family Adverse effects from pharmacologic treatments: Monitor for hematologic and hepatic ADE.  Recommend discussion of trial of ACHEI for inappropriate behavioral issues.    Advanced dementia with agitation managed with depakote. He does occassionally suffer from insomnia associated. Speaking to Patient's daughters Lattie Haw and Margarita Grizzle, they desire to have a palliative care consult.   - Will continue depakote - Melatonin 3 mg qHS - Palliative care consult

## 2015-06-22 NOTE — Telephone Encounter (Signed)
Emergency Line / After Hours Call  Patient is a new admission to family medicine geriatric panel. Patient has a history of incontinence and had his foley catheter removed today. Patient is incontinent at baseline and wears sanitary diapers. He has had roughly 700 mL out today but none since 1600 this afternoon. Upon chart review it looks like he has roughly between 1100-1500 mL output per day. Advised if the patient does have any output by midnight or 1 AM then nursing should perform an in and out cath.  Rosemarie Ax, MD PGY-3, [facility] 06/22/2015, 10:10 PM

## 2015-06-22 NOTE — Assessment & Plan Note (Signed)
Currently managed with Xanax PRN - he has not gotten this yet in the hospital, given his age and risk associated with benzodiazepines, will d/c this now

## 2015-06-22 NOTE — Assessment & Plan Note (Signed)
Per family he has frequent falls.  - Will maintain falls precautions

## 2015-07-03 ENCOUNTER — Encounter: Payer: Self-pay | Admitting: Student

## 2015-07-03 ENCOUNTER — Encounter: Payer: Self-pay | Admitting: Pharmacist

## 2015-07-03 DIAGNOSIS — N138 Other obstructive and reflux uropathy: Secondary | ICD-10-CM | POA: Insufficient documentation

## 2015-07-03 DIAGNOSIS — N401 Enlarged prostate with lower urinary tract symptoms: Secondary | ICD-10-CM

## 2015-07-04 NOTE — Assessment & Plan Note (Signed)
Trial of Flomax before voiding trial, if voiding trial agreed to by family.

## 2015-07-04 NOTE — Progress Notes (Signed)
[place]  Visit  Primary Care Provider: Hulan Fess, MD Location of Care: [facility] Visit Information: Attending admission Patient accompanied by self Source(s) of information for visit: patient, nursing home and past medical records  Chief Complaint:  Chief Complaint  Patient presents with  . [place] Admission    Nursing Concerns: Initial difficulty with getting up unattended; putting at Foley catheter and removing catheter from bag, pushing assistance away  Nutrition Concerns: none  Wound Care Nurse Concerns: none  PT Concerns and Goals: Concerns imparied Balance, inattention, impaired leg strength, impaired physical endurance ;  improved activity tolerance, improved balance, postural control, ability to compensate for deficits, functional use of  RIGHT lower and LEFT lower extremity, improved attention, improved awareness and improved coordination;  -  OT Concerns and Goals:; Patient will increase UE function to allow independence in all self-care activities. ; Patient will be independent  (independent, minimal assist, moderate assist, maximum assist, dependent) in using adaptive equipment prn.    If SNF admission, patient's goal for the rehabilitation admission:  Goals identified by the patient:;  increase overall strength and endurance, ambulate at home level with appropriate assistive device and Return to living at home.  ;     Family Goals: to be discussed at family meeting.    If SNF admission, discharge disposition goals:  Other: To be discussed with family   HISTORY OF PRESENT ILLNESS: Outpatient Encounter Prescriptions as of 06/21/2015  Medication Sig  . clopidogrel (PLAVIX) 75 MG tablet Take 1 tablet (75 mg total) by mouth daily.  . divalproex (DEPAKOTE ER) 500 MG 24 hr tablet Take 1 tablet (500 mg total) by mouth 2 (two) times daily.  . ferrous sulfate 325 (65 FE) MG tablet Take 325 mg by mouth 2 (two) times daily with a  meal.   . Insulin Glargine (LANTUS SOLOSTAR) 100 UNIT/ML SOPN Inject 35 Units into the skin daily.  Marland Kitchen lisinopril (PRINIVIL,ZESTRIL) 5 MG tablet Take 5 mg by mouth every morning.  . Melatonin 3 MG TABS Take 3 mg by mouth every evening.  . metFORMIN (GLUCOPHAGE) 1000 MG tablet Take 1 tablet by mouth 2 (two) times daily.  . Multiple Vitamin (MULTIVITAMIN WITH MINERALS) TABS Take 1 tablet by mouth daily.  . pantoprazole (PROTONIX) 40 MG tablet Take 1 tablet by mouth daily.  Marland Kitchen PARoxetine (PAXIL) 40 MG tablet Take 40 mg by mouth at bedtime.   . simvastatin (ZOCOR) 40 MG tablet Take 40 mg by mouth every evening.  . venlafaxine XR (EFFEXOR-XR) 150 MG 24 hr capsule Take 150 mg by mouth 2 (two) times daily.   Marland Kitchen ibuprofen (ADVIL,MOTRIN) 200 MG tablet Take 200 mg by mouth every 6 (six) hours as needed for pain. Reported on 07/03/2015  . ONE TOUCH ULTRA TEST test strip   . Wheat Dextrin (BENEFIBER PO) Take by mouth. Reported on 07/03/2015  . [DISCONTINUED] ALPRAZolam (XANAX) 1 MG tablet Take 0.5 mg by mouth 2 (two) times daily as needed for anxiety.    No facility-administered encounter medications on file as of 06/21/2015.   Allergies  Allergen Reactions  . Ciprofloxacin Rash  . Vicodin [Hydrocodone-Acetaminophen] Rash   History Patient Active Problem List   Diagnosis Date Noted  . 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 Dementia with behavioral disturbance 06/22/2015  . Falls frequently 06/22/2015  . Incontinence 06/22/2015  . Fracture of rib of right side 06/15/2015  . Leg weakness 02/20/2015  . Chronic low back pain 11/24/2014  .  Anemia 10/04/2011  . Depression 10/04/2011  . CVA (cerebral infarction) 10/02/2011  . HTN (hypertension) 10/02/2011  . Hyperlipidemia 10/02/2011  . Diabetes mellitus, type 2 ([place]) 10/02/2011   Past Medical History  Diagnosis Date  . Diabetes mellitus   . Hypertension   . Hyperlipidemia   . Anxiety   . Anemia 11-08-12    mild  anemia  . Stroke [facility]) 11-08-12    x2 , last 09-29-11(confusion,left side weakness) no residual"some memory problems.  . Chronic kidney disease     must self cath  . Encephalopathy acute 06/17/2015  . Depression 10/04/2011  . Diabetes mellitus, type 2 ([place]) 10/02/2011  . Falls frequently 06/22/2015  . HTN (hypertension) 10/02/2011  . Incontinence 06/22/2015  . Leg weakness 02/20/2015  . Dementia 06/22/2015  . CVA (cerebral infarction) 10/02/2011  . Anxiety state 06/22/2015  . Agitation 02/20/2015  . Acute encephalopathy 06/16/2015   Past Surgical History  Procedure Laterality Date  . Back surgery      1996- metal plate and screws for degenerative changes spine  . Colonoscopy with propofol N/A 01/12/2013    Procedure: COLONOSCOPY WITH PROPOFOL;  Surgeon: Arta Silence, MD;  Location: [facility];  Service: Endoscopy;  Laterality: N/A;   Family History  Problem Relation Age of Onset  . Dementia Mother   . Alzheimer's disease Mother   . Stroke Mother   . Lung cancer Father   . Colon cancer Maternal Grandmother     reports that he has quit smoking. His smoking use included Cigarettes. He has a 5 pack-year smoking history. He quit smokeless tobacco use about 53 years ago. His smokeless tobacco use included Chew. He reports that he drinks alcohol. He reports that he does not use illicit drugs.  Basic Activities of Daily Living   ADLs Independent Needs Assistance Dependent  Bathing     Dressing     Ambulation     Toileting     Eating        Instrumental Activities of Daily Living  IADL Independent Needs Assistance Dependent  Cooking     Housework     Manage Medications     Manage the telephone     Shopping for food, clothes, Meds, etc     Use transportation     [place] in the past six months:   Yes, multiple falls at home with right 8th rib fx.  [place] [address] out of [facility] at [facility] 06/24/15.   Diet:  diabetic   Nutritional Supplements:  Medpass: yes  Review of Systems   Patient has ability to communicate answers to ROS: yes See HPI  Geriatric Syndromes: Constipation no ,   Incontinence foley cath, occasional fecal incontinence per pt  Dizziness no   Syncope no   Skin problems no   Visual Impairment no   Hearing impairment yes  Eating impairment Yes, poorly fitting dentures  Impaired Memory or Cognition yes   Behavioral problems yes   Sleep problems yes   Weight loss no    Pain:  Pain Location: Back Pain Rating: He is currently in no pain.  Pain Duration: chronic Pain Therapies: Tylenol Pain Response to Therapies: Location: back ; Severity: improved Bowel Movement Difficulty: no  Dyspnea: Dyspnea Rating: only with exertin Dyspnea Goal: mild  TTE (10/03/11) The cavity size was normal. Systolicfunction was normal. The estimated ejection fraction was in the range of 50% to 55%. Wall motion was normal; there were no regional wall  motion abnormalities   General: Denies weight gain nor loss.  Eyes: Denies blurred vision  Ears/Nose/Throat: Denies ear pain, throat pain, rhinorrhea, nasal congestion.  Cardiovascular: Denies chest pains, peripheral edema.  Respiratory: Denies cough Gastrointestinal: Denies abdominal pain,constipation, diarrhea.  Musculoskeletal: Denies knee joint pain  Skin: Denies skin rash or ulcers. Neurologic: Denies headache.  Psychiatric: Denies psychosis. Endocrine: Denies weight loss   PHYSICAL EXAM:. Wt Readings from Last 3 Encounters:  07/03/15 167 lb 3.2 oz (75.841 kg)  06/16/15 182 lb (82.555 kg)  05/04/15 182 lb (82.555 kg)   Temp Readings from Last 3 Encounters:  06/28/15 97.7 F (36.5 C)   06/18/15 98.9 F (37.2 C) Oral  12/13/13 97 F (36.1 C) Oral   BP Readings from Last 3 Encounters:  07/03/15 148/86  06/18/15 164/79  05/04/15 117/74   Pulse Readings from Last 3 Encounters:  06/28/15 96  06/18/15 68  05/04/15 94    General: alert, cooperative, well nourished, pleasant, clean,  groomed HEENT:  No scleral icterus, no nasal secretions, EACs not occluded, TMs clear bilat, Oromucosa moist and no erythema or lesion Neck:  Supple, No JVD CV:  RRR, no murmur, trace bilateral ankle edema L>R RESP: No resp distress or accessory muscle use.  Clear to ausc bilat. No wheezing, no rales, no rhonchi.  ABD:  Soft, Non-tender, non-distended, +bowel sounds, no masses, Foley cath in place MSK:  Lumbar midline incision No joint swelling or redness EXT: Warm and well perfused   Unable to palpate DPP nor PTP, Cap reill < 2 sec, feet with brittle nails, Gait:  Not tested, in manual WC Skin: no skin breakdown over feet nor sacrum  Neurologic:Cranial nerves normal;  Muscle Tone within normal limits; Motor Strength: weakness of5/5 grip, Bil UE and bilateral LE. Sensation: WFL by patient report; Cerebellar: no tremors noted;  Fair Psych:  Orientation oriented to person, place, time (month and year), and general circumstances; Judgment Fair, Insight Fair Memory remote memory intact, recent memory impaired; Attention Normal;  Mood appropriate; Speech soft, clearl; Language no barriers ; Thought Coherent, Relevant and concrete    MMSE - Mini Mental State Exam 07/03/2015 05/04/2015  Orientation to time 2 4  Orientation to Place 5 4  Registration 3 3  Attention/ Calculation 0 0  Recall 0 2  Language- name 2 objects 2 2  Language- repeat 1 1  Language- follow 3 step command 3 3  Language- read & follow direction 1 1  Write a sentence 1 1  Copy design 0 0  Total score 18 21   No flowsheet data found.  MiniCog: Failed  Years of Education: =12 years  Assessment and Plan:   See Problem List for individual problem's assessment and plans.     Advanced Directives (MOST form, Living Will, HCPOA): MOST form completed by dgts and son of patient with Marisue Ivan, RN with Palliative Care service 07/02/15 Code Status:    DNR/DNI.   Intubation Status: DNI Intravenous Fluids:  Yes Feeding  Tubes: NO Antibiotics: Yes Hospitalization: Hospitalize for reversiable condtions after discussion with HCPOA Emergency contact:  ** Follow Up:  Next 30 days unless acute issues arise.

## 2015-07-10 ENCOUNTER — Encounter: Payer: Self-pay | Admitting: Family Medicine

## 2015-07-10 ENCOUNTER — Non-Acute Institutional Stay: Payer: Medicare Other | Admitting: Family Medicine

## 2015-07-10 ENCOUNTER — Telehealth: Payer: Self-pay | Admitting: Family Medicine

## 2015-07-10 DIAGNOSIS — K5901 Slow transit constipation: Secondary | ICD-10-CM

## 2015-07-10 DIAGNOSIS — F03918 Unspecified dementia, unspecified severity, with other behavioral disturbance: Secondary | ICD-10-CM

## 2015-07-10 DIAGNOSIS — N481 Balanitis: Secondary | ICD-10-CM | POA: Diagnosis not present

## 2015-07-10 DIAGNOSIS — K635 Polyp of colon: Secondary | ICD-10-CM | POA: Insufficient documentation

## 2015-07-10 DIAGNOSIS — R159 Full incontinence of feces: Secondary | ICD-10-CM | POA: Insufficient documentation

## 2015-07-10 DIAGNOSIS — F0391 Unspecified dementia with behavioral disturbance: Secondary | ICD-10-CM | POA: Diagnosis not present

## 2015-07-10 DIAGNOSIS — D649 Anemia, unspecified: Secondary | ICD-10-CM
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
## 2015-07-10 NOTE — Telephone Encounter (Signed)
Will forward to MD. Gurvir Schrom,CMA  
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
## 2015-07-10 NOTE — Telephone Encounter (Signed)
I spoke with step-son, Robin.  Please see my nursing home note for today for details.

## 2015-07-12 LAB — HEPATIC FUNCTION PANEL
ALK PHOS: 87 U/L (ref 25–125)
ALT: 23 U/L (ref 10–40)
AST: 19 U/L (ref 14–40)
Bilirubin, Total: 0.4 mg/dL

## 2015-07-12 LAB — CBC AND DIFFERENTIAL
HCT: 40 % — AB (ref 41–53)
Hemoglobin: 13.4 g/dL — AB (ref 13.5–17.5)
Platelets: 328 10*3/uL (ref 150–399)
WBC: 7.6 10*3/mL

## 2015-07-12 LAB — BASIC METABOLIC PANEL
BUN: 22 mg/dL — AB (ref 4–21)
CREATININE: 0.9 mg/dL (ref 0.6–1.3)
Glucose: 98 mg/dL
POTASSIUM: 3.9 mmol/L (ref 3.4–5.3)
SODIUM: 140 mmol/L (ref 137–147)

## 2015-07-19 ENCOUNTER — Encounter: Payer: Self-pay | Admitting: Family Medicine

## 2015-07-19 LAB — VALPROIC ACID LEVEL: Valproic Acid Lvl: 40 — AB (ref 50–100)

## 2015-07-24 ENCOUNTER — Telehealth: Payer: Self-pay | Admitting: Family Medicine
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
## 2015-07-25 ENCOUNTER — Non-Acute Institutional Stay: Payer: Medicare Other | Admitting: Family Medicine

## 2015-07-25 ENCOUNTER — Encounter: Payer: Self-pay | Admitting: Family Medicine

## 2015-07-25 DIAGNOSIS — T83511A Infection and inflammatory reaction due to indwelling urethral catheter, initial encounter: Principal | ICD-10-CM

## 2015-07-25 DIAGNOSIS — R634 Abnormal weight loss: Secondary | ICD-10-CM | POA: Insufficient documentation

## 2015-07-25 DIAGNOSIS — N390 Urinary tract infection, site not specified: Secondary | ICD-10-CM | POA: Diagnosis not present

## 2015-07-25 HISTORY — DX: Abnormal weight loss: R63.4

## 2015-07-25 HISTORY — DX: Urinary tract infection, site not specified: N39.0
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
## 2015-07-25 NOTE — Assessment & Plan Note (Signed)
New Problem  Further work up: Urine culture  Empiric therapy with Keflex for 14 days given complicated nature of infection.  Monitor for response

## 2015-07-25 NOTE — Telephone Encounter (Signed)
Pt visited.  See NH acute visit note for today.

## 2015-07-25 NOTE — Telephone Encounter (Signed)
Will forward to MD. Dhana Totton,CMA  

## 2015-07-25 NOTE — Assessment & Plan Note (Signed)
New problem Continuing to decline since admission to hospital 06/16/15 20% patients with unintentional weight loss without cause found after investigation Appetitie naturally diminishes with age related to gastric hormone.  Food intake also deminishes with age due to decrease activity, lowered basal metabolic rate, and loss muscle mass.   10 pounds weight loss since 06/20/15 Constitutional symptoms: No  Medications cause: Yes  Eating: decreased Poor Appetite:  Yes  Late onset psychosis:  No  Swallowing pxs: No   Oral pxs: Yes poor fitting dentures Limited money: No  Wandering and Other Behavioral & Psychological Symptoms of Dementia: No  Hyperthryroid/Conn's or Adrenal Insufficiency/Diabetes/Hypercalcemia: No  Enteric pxs: No  Emotional Depression or Anxiety: Yes  Low salt or low fat diet: No  Stones, social problems (e.g., isolation, inability to obtain preferred foods): No     Recommendation Treat possible CA-UTI with Keflex Consider trial off medications such as Metformin, SNRI, Valproate Remove any dietary restrictions Add Nutritional supplements Consult Nutritionist Ask family to have patient evaluated by dentistry to have dentures fit better.  Continue meals in dining hall for socialization

## 2015-07-27 NOTE — Progress Notes (Signed)
Receive office notes from [facility] for patients back pain. Pt had a Ct myelogram of the lumbar spine.

## 2015-08-08 ENCOUNTER — Non-Acute Institutional Stay: Payer: Medicare Other | Admitting: Family Medicine

## 2015-08-08 ENCOUNTER — Encounter: Payer: Self-pay | Admitting: Family Medicine

## 2015-08-08 DIAGNOSIS — R634 Abnormal weight loss: Secondary | ICD-10-CM | POA: Diagnosis not present

## 2015-08-08 LAB — CBC AND DIFFERENTIAL
BASOS PCT: 0
Basophils Absolute: 0 /uL
EOS PCT: 1
Eosinophils Absolute: 0 /uL
Granulocyte count absolute: 4.6
Granulocyte percent: 62 %G (ref 37–80)
HCT: 34 %
Hemoglobin: 11.4
Lymphocyte %: 14
Lymphocytes absolute: 1 10*3/uL (ref 0.1–1.8)
MCH: 28.3
MCHC: 33.5
MCV: 84.4
MONOS PCT: 23
MPV: 9.7 fL (ref 7.5–11.5)
Monocytes(Absolute): 1.7
PLATELET COUNT: 393
RBC: 4.03
RDW: 14.2
WBC: 7.4

## 2015-08-08 LAB — URINALYSIS
BILIRUBIN: NEGATIVE
Glucose: NEGATIVE
NITRITE: POSITIVE
PH: 5.5
SPECIFIC GRAVITY: 1.018

## 2015-08-08 LAB — BASIC METABOLIC PANEL
BUN: 19 mg/dL (ref 4–21)
CO2: 25 mmol/L
Calcium: 8.7 mg/dL
Chloride: 102 mmol/L
Creat: 0.95
Glucose: 178
POTASSIUM: 4 mmol/L
SODIUM: 138

## 2015-08-08 LAB — URINE MICROSCOPIC-ADD ON
CASTS: NONE SEEN
CRYSTALS: NONE SEEN
RBC: NONE SEEN
Squamous Epithelial: NONE SEEN
YEAST: NONE SEEN

## 2015-08-08 NOTE — Progress Notes (Signed)
Patient ID: Reginald Patterson, male   DOB: 03/18/1941, 75 y.o.   MRN: [id number] [facility] Visit   HISTORY OF PRESENT ILLNESS: Reginald Patterson is a 75 y.o.  male.    Weight loss Unintentional Weight Loss in Older Adults  Diet Type: NO Conc Sweets; Mechanical Soft / Ground; Thin  Average meal intake, recent: good  Feeding Group: No   Fraility: Yes    Medication effects ( metformin, SS/SNRI,): Yes   Level of physical activity: Sedentary;  some assist (25-50%)  Polypharmacy (> 4 medications): Yes   Malignancy Hx*: No   Ongoing inflammatory or increased catabolic condition: No   Recent acute Illness: Yes, CAUTI (neurogenic bladder with severe BPH)  Emotional problems, especially depression*: Yes   Alcoholism / Substance Abuse: No   Late-life paranoia: No   Swallowing disorders: No   Dysgeusia:No   Oral factors (e.g., poorly fitting dentures, caries): Yes   Food insecurity: No   Hyperthyroidism, hypothyroidism, hyperparathyroidism/hypercalcemia, hypoadrenalism:07/12/15 normal CBC and CMEt with albumin 3.7.  Recent labs pending including TSH, CMET, CBC.  GI Disorders Hx* (Ischemic bowel, IBD, pancreatic insufficiency, PUD, GERD, celiac disease): Diarrhea prior to recent hospital admission for which patient had seen Dr Paulita Fujita with colonoscopy  Nausea and/or Vomiting: No   GI/Biliary surgeries: No - colonoscopy with Dr Paulita Fujita  Eating problems (e.g., inability to feed self): Yes, tremor interferes with using utensils  Dental or denture problems: Yes, ill fitting.   Low-salt, low-cholesterol diet: No   Stones, social problems (e.g., isolation, inability to obtain preferred foods): No   Cognitive impairment*: Yes   Immunocompromised: No   Diabetes:Yes ; Control: Yes    Organ Failure (Cardiac, Respiratory, Renal, Liver): No    Autoimmune Disorders (RA, SLE, etc): No    Neurologic Conditions (Stroke, Dementia): Yes   Symptoms:  General Thirst: No  Fever: No   Night Sweats:No   Rigors: No  Fatigue: Yes    HEENT Headache (Temporal Arteritis): No  Head cold symptoms: No  Oral sores / bad teeth: No  If dentures, well-fitting: No  Cardiovascular Abdominal pain with eating: No  Heart Failure Hx: No  Respiratory Pulmonary Disease Hx: No  Cough: No  Gastrointestinal Indigestion/heartburn: Yes  Epigastric Pain: No  Hematemesis: No  Nausea and/or Vomiting: No  Melena: No  Hematochezia: No  Abdomina Swelling: No  Bloating: No  Diarrhea: Yes  Constipation: Yes  Genitourinary Flank pain: No  Suprapubic pain: No  Dysuria: No  Frequency: Yes  Urinary hesitancy: Yes  New urinary incontinence: No  Hematologic Anemia History: No  Swollen lymph nodes: No  Musculoskeletal Shoulder stiffness:  No  Muscle strength decrease: No  Joint Swelling/pain: No  Neuropsychologic Prolonged sadness: Yes  Loss of pleasure: Yes  Paranoia: No  Anxiousness / fearfullness: No   Cancer Screening History  Colorectal Cancer: colonoscopy with polys Prostate Cancer: notdiagnosed Lung Cancer: No    20% patients with unintentional weight loss without cause found after investigation Appetitie naturally diminishes with age related to gastric hormone.  Food intake also deminishes with age due to decrease activity, lowered basal metabolic rate, and loss muscle mass.  Possible diagnostic labs or imaging: CMET, LDH,  CBC and Ferritin, ESR, C- reactive protein, vitamin B12, Folate, Vitamin D,  ANA, TSH, FOBT, +/- urinalysis with culture, CXR, Abdomina 2 Viex Xray,  Abdominal US, Thoracic/Abdominal/Pelvic CT with contrast.   * Common etiology  Common Treatments - Treat underlying reversible or maximizable causes - Treat depression / anxiety -  Consult with Nutritionists - +/- Nutritional supplements - +/- Appetite stimulants - Remove dietary restrictions,  if possible. Allow family to bring in patient's favorite foods and snacks.   - Stop possibly causal  medications, if possible - Assess Swallow function - Address dental or denture difficulties - Treat infectious processes,  - Treat endocrine conditions - Order assistance with feeding - Meals in social setting - provide multivitamin    DIARRHEA  Onset: ongoing problem, onset before hospitalization  Time course: stable  Description: watery, nonbloody, nonpainful   Number of stools per day: several  Previous treatment: no  Vomiting: no  Abdominal Pain: no  Weight loss: yes  Decreased urine output: no  Lightheadedness: no  Recent travel history: no    Sick contacts: no    Suspicious food or water: no  Change in diet: no  Prior workup by Dr Paulita Fujita (GI)  Red Flags Fever: no  Bloody stools: no  Recent antibiotics: yes  Immuncompromised: diabetes     History Patient Active Problem List   Diagnosis Date Noted  . Infection of urinary tract 07/25/2015  . Weight loss, unintentional 07/25/2015  . Colon polyps   . Fecal incontinence   . 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 Dementia with behavioral disturbance 06/22/2015  . Falls frequently 06/22/2015  . Incontinence 06/22/2015  . Fracture of rib of right side 06/15/2015  . Leg weakness 02/20/2015  . Chronic low back pain 11/24/2014  . Anemia 10/04/2011  . Depression 10/04/2011  . CVA (cerebral infarction) 10/02/2011  . HTN (hypertension) 10/02/2011  . Hyperlipidemia 10/02/2011  . Diabetes mellitus, type 2 ([place]) 10/02/2011   There are no diagnoses linked to this encounter.  Medications  Current outpatient prescriptions:  .  aspirin 81 MG tablet, Take 81 mg by mouth daily., Disp: , Rfl:  .  divalproex (DEPAKOTE ER) 500 MG 24 hr tablet, Take 1 tablet (500 mg total) by mouth 2 (two) times daily., Disp: 30 tablet, Rfl: 3 .  ibuprofen (ADVIL,MOTRIN) 200 MG tablet, Take 200 mg by mouth every 6 (six) hours as needed for pain. Reported on 07/03/2015, Disp: , Rfl:  .  Insulin Glargine  (LANTUS SOLOSTAR) 100 UNIT/ML SOPN, Inject 35 Units into the skin daily., Disp: , Rfl:  .  lisinopril (PRINIVIL,ZESTRIL) 5 MG tablet, Take 5 mg by mouth every morning., Disp: , Rfl:  .  Melatonin 3 MG TABS, Take 3 mg by mouth every evening., Disp: , Rfl:  .  metFORMIN (GLUCOPHAGE) 1000 MG tablet, Take 1 tablet by mouth 2 (two) times daily., Disp: , Rfl:  .  Multiple Vitamin (MULTIVITAMIN WITH MINERALS) TABS, Take 1 tablet by mouth daily., Disp: , Rfl:  .  ONE TOUCH ULTRA TEST test strip, , Disp: , Rfl:  .  pantoprazole (PROTONIX) 40 MG tablet, Take 1 tablet by mouth daily., Disp: , Rfl:  .  PARoxetine (PAXIL) 40 MG tablet, Take 40 mg by mouth at bedtime. , Disp: , Rfl:  .  simvastatin (ZOCOR) 40 MG tablet, Take 40 mg by mouth every evening., Disp: , Rfl:  .  venlafaxine XR (EFFEXOR-XR) 150 MG 24 hr capsule, Take 150 mg by mouth 2 (two) times daily. , Disp: , Rfl:  .  Wheat Dextrin (BENEFIBER PO), Take by mouth. Reported on 07/03/2015, Disp: , Rfl:    There were no vitals filed for this visit.  Wt Readings from Last 3 Encounters:  07/25/15 172 lb 8 oz (78.245 kg)  07/03/15 167 lb  3.2 oz (75.841 kg)  06/16/15 182 lb (82.555 kg)     Review of Systems:  See HPI  PHYSICAL EXAM:. General: No acute distress, well nourished, pleasant, lying in bed with wife at bedside HEENT: [place] moist and no erythema or lesion Neck:  Supple, No JVD, no lymphadenopathy CV:  RRR, no murmur, no ankle swelling RESP: No resp distress or accessory muscle use.  Clear to ausc bilat. No wheezing, no rales, no rhonchi.  ABD:  Soft, Non-tender, non-distended, +bowel sounds, no masses Skin:  No rash limbs, face, or torso.  Neurologic:  Cranial nerves 2-12 grossly intact, normal tone in extremities, normal strength in extremities, (+) intentional tremor bilateral arms Psych:  Fully oriented, Judgment and insight normal, Memory normal for longterm, Mood and affect appropriate   Assessment and Plan:    See  problems list  Code Status:     Code Status History    Date Active Date Inactive Code Status Order ID Comments User Context   06/16/2015  9:31 PM 06/18/2015 10:11 PM Full Code [id number]  Hosie Poisson, MD Inpatient   04/17/2015 10:27 AM 04/18/2015  3:25 AM Full Code [id number]  Logan Bores, MD HOV        There are no discontinued medications.

## 2015-08-09 ENCOUNTER — Ambulatory Visit: Payer: Medicare Other | Admitting: Neurology

## 2015-08-13 ENCOUNTER — Encounter: Payer: Self-pay | Admitting: Family Medicine

## 2015-08-13 NOTE — Assessment & Plan Note (Signed)
Wt Readings from Last 3 Encounters:  08/12/15 157 lb (71.215 kg)  07/25/15 172 lb 8 oz (78.245 kg)  07/03/15 167 lb 3.2 oz (75.841 kg)   Ongoing problem  Stopping metformin, titrating off Effexor XR. Follow up CBC, CMET, TSH, ESR Dgt requesting that hold off on GI consultation Dgt requesting Palliative Care consultation for [place].  Request for Palliative Care consultation made.

## 2015-08-16 ENCOUNTER — Non-Acute Institutional Stay: Payer: Medicare Other | Admitting: Family Medicine

## 2015-08-16 DIAGNOSIS — N312 Flaccid neuropathic bladder, not elsewhere classified: Secondary | ICD-10-CM

## 2015-08-16 DIAGNOSIS — I10 Essential (primary) hypertension: Secondary | ICD-10-CM

## 2015-08-16 DIAGNOSIS — R634 Abnormal weight loss: Secondary | ICD-10-CM | POA: Diagnosis not present

## 2015-08-16 DIAGNOSIS — E118 Type 2 diabetes mellitus with unspecified complications: Secondary | ICD-10-CM | POA: Diagnosis not present

## 2015-08-16 DIAGNOSIS — I7090 Unspecified atherosclerosis: Secondary | ICD-10-CM

## 2015-08-16 DIAGNOSIS — Z794 Long term (current) use of insulin: Secondary | ICD-10-CM

## 2015-08-16 DIAGNOSIS — I70209 Unspecified atherosclerosis of native arteries of extremities, unspecified extremity: Secondary | ICD-10-CM

## 2015-08-16 DIAGNOSIS — N390 Urinary tract infection, site not specified: Secondary | ICD-10-CM

## 2015-08-16 DIAGNOSIS — F0150 Vascular dementia without behavioral disturbance: Secondary | ICD-10-CM

## 2015-08-16 NOTE — Progress Notes (Deleted)
Patient ID: Reginald Patterson, male   DOB: November 13, 1940, 75 y.o.   MRN: [id number]

## 2015-08-16 NOTE — Progress Notes (Signed)
Subjective:     Patient ID: Reginald Patterson, male   DOB: 1940-11-04, 75 y.o.   MRN: [id number]  HPI Reginald Patterson is a 75yo male currently residing in [facility]. Prior history of stroke, diabetes, hypertension, dementia, and CKD Stage II noted.   Prior to admission to nursing home, he was admitted to the hospital for confusion and agitation and noted to have a UTI. Cultures showed pan sensitive S. aureus. He was treated with Rocephin and transitioned to Keflex. Fall was noted prior to admission and fracture of 8th right rib noted on xray during hospitalization. Confusion and agitation improved prior to discharge from hospital.  Diagnosed with dementia three years ago. Has been becoming more verbally abuse and has had several falls, making it difficult for Reginald Patterson to care for. Patterson was unable to help him up after falls. Needed assistance for bathing, dressing, and toileting. Eats independently.  Was completely dependent on caretaker for cooking, housework, managing medications, shopping, transportation, and finances.   Patterson is health care power of attorney. Currently DNR/DNI.  On 07/25/15, reported fatigue and loss of appetite. Also noted feelings of depression and increased sleep. Also noted cloudy urine and dysuria. Was treated with 14 day course of Keflex.  On 08/15/15, family noted increasing confusion. On 3/24, bilateral lower quadrant tenderness was noted with rigors and decreased appetite. He was seen at [facility] ED and diagnosed with ED with prescription for Keflex.   Denies any current concerns. Patterson present and also denies any current concerns. Doing much better on antibiotics. Appreciate care given by geriatrics team.  Review of Systems Per HPI. Other systems negative.    Objective:   Physical Exam  Constitutional: He is oriented to person, place, and time. He appears well-developed and well-nourished. No distress.  HENT:  Head: Normocephalic and atraumatic.   Cardiovascular: Normal rate.  Exam reveals no gallop and no friction rub.   No murmur heard. Pulmonary/Chest: Effort normal. No respiratory distress. He has no wheezes.  Abdominal: Soft. Bowel sounds are normal. He exhibits no distension. There is no tenderness.  Musculoskeletal: He exhibits no edema.  Neurological: He is alert and oriented to person, place, and time.  Skin: No rash noted.  Psychiatric: He has a normal mood and affect. Reginald behavior is normal.      Assessment and Plan:     # Urinary Tract Infection: - Urine culture positive for E. Coli, sensitivity for Ampicillin noted. - Treated with Keflex until 3/27. Transitioned to Amoxicillin 500mg  QID x14 days. End Date 09/03/15.  # Hypertension: - Lisinopril - Check blood pressures weekly  # Diabetes: - Metformin discontinued due to diarrhea, weight loss - Lantus 35units daily  # Anemia: - Ferrous Sulfate discontinued 2/14 - Check CBC in one month  # Depression/Anxiety: - Paxil - Titrating off of Effexor - Xanax PRN  # Hyperlipidemia: - Atorvastatin  # Dementia: - Depakote - Melatonin 3mg  nightly to help with sleep - Donepezile 5mg  initiated on 2/14. Consider titrating up to 10mg  on 3/14. Will do 2 month trial. - Fall precautions  # Incontinence with h/o Mechanical Balanitis/BPH: - Foley discontinued at previous visit - In/Out Catheterizations each shift  # Constipation: - Senokot 2 tablets daily  # Weight Loss: - Monitor weight - Nutritionist consulted

## 2015-08-17 ENCOUNTER — Emergency Department (HOSPITAL_COMMUNITY)
Admission: EM | Admit: 2015-08-17 | Discharge: 2015-08-17 | Disposition: A | Discharge status: Home / Self Care | Payer: Medicare Other | Attending: Emergency Medicine | Admitting: Emergency Medicine

## 2015-08-17 ENCOUNTER — Non-Acute Institutional Stay: Payer: Medicare Other | Admitting: Family Medicine

## 2015-08-17 ENCOUNTER — Encounter: Payer: Self-pay | Admitting: Family Medicine

## 2015-08-17 ENCOUNTER — Emergency Department (HOSPITAL_COMMUNITY): Payer: Medicare Other

## 2015-08-17 ENCOUNTER — Inpatient Hospital Stay: Admit: 2015-08-17 | Payer: Self-pay | Admitting: Family Medicine

## 2015-08-17 ENCOUNTER — Encounter (HOSPITAL_COMMUNITY): Payer: Self-pay | Admitting: Emergency Medicine

## 2015-08-17 ENCOUNTER — Telehealth: Payer: Self-pay | Admitting: Family Medicine

## 2015-08-17 DIAGNOSIS — Z8673 Personal history of transient ischemic attack (TIA), and cerebral infarction without residual deficits: Secondary | ICD-10-CM | POA: Diagnosis not present

## 2015-08-17 DIAGNOSIS — Z862 Personal history of diseases of the blood and blood-forming organs and certain disorders involving the immune mechanism: Secondary | ICD-10-CM | POA: Insufficient documentation

## 2015-08-17 DIAGNOSIS — F0390 Unspecified dementia without behavioral disturbance: Secondary | ICD-10-CM | POA: Diagnosis not present

## 2015-08-17 DIAGNOSIS — Z87891 Personal history of nicotine dependence: Secondary | ICD-10-CM | POA: Diagnosis not present

## 2015-08-17 DIAGNOSIS — F419 Anxiety disorder, unspecified: Secondary | ICD-10-CM | POA: Insufficient documentation

## 2015-08-17 DIAGNOSIS — R4182 Altered mental status, unspecified: Secondary | ICD-10-CM | POA: Diagnosis present

## 2015-08-17 DIAGNOSIS — F329 Major depressive disorder, single episode, unspecified: Secondary | ICD-10-CM | POA: Insufficient documentation

## 2015-08-17 DIAGNOSIS — N390 Urinary tract infection, site not specified: Secondary | ICD-10-CM

## 2015-08-17 DIAGNOSIS — Z7982 Long term (current) use of aspirin: Secondary | ICD-10-CM | POA: Diagnosis not present

## 2015-08-17 DIAGNOSIS — Z8669 Personal history of other diseases of the nervous system and sense organs: Secondary | ICD-10-CM | POA: Diagnosis not present

## 2015-08-17 DIAGNOSIS — I129 Hypertensive chronic kidney disease with stage 1 through stage 4 chronic kidney disease, or unspecified chronic kidney disease: Secondary | ICD-10-CM | POA: Insufficient documentation

## 2015-08-17 DIAGNOSIS — T83511A Infection and inflammatory reaction due to indwelling urethral catheter, initial encounter: Secondary | ICD-10-CM

## 2015-08-17 DIAGNOSIS — N189 Chronic kidney disease, unspecified: Secondary | ICD-10-CM | POA: Insufficient documentation

## 2015-08-17 DIAGNOSIS — Z86010 Personal history of colonic polyps: Secondary | ICD-10-CM | POA: Insufficient documentation

## 2015-08-17 DIAGNOSIS — Z8781 Personal history of (healed) traumatic fracture: Secondary | ICD-10-CM | POA: Insufficient documentation

## 2015-08-17 DIAGNOSIS — Z794 Long term (current) use of insulin: Secondary | ICD-10-CM | POA: Diagnosis not present

## 2015-08-17 DIAGNOSIS — Z79899 Other long term (current) drug therapy: Secondary | ICD-10-CM | POA: Insufficient documentation

## 2015-08-17 DIAGNOSIS — R410 Disorientation, unspecified: Secondary | ICD-10-CM | POA: Diagnosis not present

## 2015-08-17 DIAGNOSIS — R000 Tachycardia, unspecified: Secondary | ICD-10-CM | POA: Diagnosis not present

## 2015-08-17 DIAGNOSIS — E785 Hyperlipidemia, unspecified: Secondary | ICD-10-CM | POA: Diagnosis not present

## 2015-08-17 DIAGNOSIS — E119 Type 2 diabetes mellitus without complications: Secondary | ICD-10-CM | POA: Insufficient documentation

## 2015-08-17 LAB — CBC WITH DIFFERENTIAL/PLATELET
BASOS ABS: 0 10*3/uL (ref 0.0–0.1)
BASOS PCT: 0 %
Eosinophils Absolute: 0.2 10*3/uL (ref 0.0–0.7)
Eosinophils Relative: 2 %
HEMATOCRIT: 35.3 % — AB (ref 39.0–52.0)
HEMOGLOBIN: 11.3 g/dL — AB (ref 13.0–17.0)
LYMPHS PCT: 11 %
Lymphs Abs: 0.9 10*3/uL (ref 0.7–4.0)
MCH: 27.7 pg (ref 26.0–34.0)
MCHC: 32 g/dL (ref 30.0–36.0)
MCV: 86.5 fL (ref 78.0–100.0)
Monocytes Absolute: 2.2 10*3/uL — ABNORMAL HIGH (ref 0.1–1.0)
Monocytes Relative: 28 %
NEUTROS ABS: 4.6 10*3/uL (ref 1.7–7.7)
Neutrophils Relative %: 59 %
Platelets: 298 10*3/uL (ref 150–400)
RBC: 4.08 MIL/uL — AB (ref 4.22–5.81)
RDW: 15.1 % (ref 11.5–15.5)
WBC: 7.8 10*3/uL (ref 4.0–10.5)

## 2015-08-17 LAB — COMPREHENSIVE METABOLIC PANEL
ALT: 23 U/L (ref 17–63)
ANION GAP: 11 (ref 5–15)
AST: 19 U/L (ref 15–41)
Albumin: 2.6 g/dL — ABNORMAL LOW (ref 3.5–5.0)
Alkaline Phosphatase: 75 U/L (ref 38–126)
BUN: 16 mg/dL (ref 6–20)
CO2: 25 mmol/L (ref 22–32)
Calcium: 9.2 mg/dL (ref 8.9–10.3)
Chloride: 101 mmol/L (ref 101–111)
Creatinine, Ser: 1.13 mg/dL (ref 0.61–1.24)
GFR calc Af Amer: >60 mL/min (ref >60)
GFR calc non Af Amer: >60 mL/min (ref >60)
GLUCOSE: 209 mg/dL — AB (ref 65–99)
POTASSIUM: 3.6 mmol/L (ref 3.5–5.1)
Sodium: 137 mmol/L (ref 135–145)
TOTAL PROTEIN: 5.8 g/dL — AB (ref 6.5–8.1)
Total Bilirubin: 0.3 mg/dL (ref 0.3–1.2)

## 2015-08-17 LAB — URINALYSIS, ROUTINE W REFLEX MICROSCOPIC
Bilirubin Urine: NEGATIVE
Glucose, UA: 500 mg/dL — AB
KETONES UR: NEGATIVE mg/dL
NITRITE: NEGATIVE
PH: 5.5 (ref 5.0–8.0)
Protein, ur: 100 mg/dL — AB
SPECIFIC GRAVITY, URINE: 1.018 (ref 1.005–1.030)

## 2015-08-17 LAB — URINE MICROSCOPIC-ADD ON

## 2015-08-17 MED ORDER — CEPHALEXIN 500 MG PO CAPS: 500.0000 mg | ORAL_CAPSULE | Freq: Four times a day (QID) | ORAL | Status: DC

## 2015-08-17 MED ORDER — DEXTROSE 5 % IV SOLN
1.0000 g | Freq: Once | INTRAVENOUS | Status: AC
  Administered 2015-08-17: 1 g via INTRAVENOUS
  Filled 2015-08-17: qty 10

## 2015-08-17 NOTE — ED Notes (Addendum)
Per EMS, pt coming in from [place] facility for increased confusion. Pt was admitted to hospital in January for similar symptoms and it was found to be a UTI. NAD at this time. BP: 126/78, HR: 110, RR:18 Per pts wife pt is at baseline with confusion.

## 2015-08-17 NOTE — Telephone Encounter (Signed)
Received call from [facility]. Nurse reports that patient has been acting more confused and wandering the halls. Concerned about UTI as he has a recent history of frequent UTIs, was on keflex for 14 days 3/1-3/15. Sent UA and C&S. Afebrile. I advised her we will wait for urine results before prescribing antibiotics and will send message to [facility] team. -Dr. Lamar Benes

## 2015-08-17 NOTE — Progress Notes (Signed)
   Subjective:    Patient ID: Reginald Patterson, male    DOB: 08/19/1940, 75 y.o.   MRN: [id number] Reginald Patterson is accompanied by patient and wife Sources of clinical information for visit is/are patient, spouse/SO, nursing home and past medical records.   HPI  Acute Mental Status change - Onset 08/15/15, patient noted by wife as not acting like himself. Confusion has been stable, now complaining of dysuria and suprapubic pain.  Reported and witnessed true rigors today.   Appetite decreased, (+) nausea.  Is drinking fluids well.  -  Patient has a neurogenic bladder requiring assisted I/O catheterization five times a day.  - Urine Culture ([facility]) from 08/15/15 I/O cath grew E. Coli > 10^5.  Sensitivities pending.   - Hx of acute confusion with UTI requiring hospitalization 1/21 thru 06/18/15 at [facility] by [place] service.  Urine culture at that time grew Staph species coag negative with pan-sensitive antibiogram.   - Patient's code status is DNR but treat reversible conditions that require minimal invasion.   - Dr Berkley Harvey discussed case with patient and wife.  They agree to hospital admission given patient's frailty-limiting ability to respond to illness, need for close monitoring  SH: no smoking    Review of Systems See HPI    Objective:   Physical Exam  Filed Vitals:   08/17/15 1429  BP: 129/70  Pulse: 109  Temp: 98.3 F (36.8 C)  Resp: 20    Gen: Alert, responsive, copperative, more confused speech/language than baseline HEENT: PERRL. No jaundice, neck supple Cor: Tachycaric, no murmur, no gallup Lungs: BCTA, no acc mm use. Abdomin: (+) BS, Tender suprapubic, No guard       Assessment & Plan:  Catheter-Associated Urinary Tract Infection - E.coli type, sensitivities pending Steve Rattler) - Rigors concerning for bacteremia - Transfer to ED for evaluation and treatment.  If patient deemed to need hospital admission or observation, please contact the Family Medicine  Teaching Service to admit.  The admitting resident has been informed about this patient's potential need for admission.   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   The following factors support the transfer to ED for evaluation and treatment and monitoring and possible hospital admission.     The patient's presenting symptoms include rigors, tachycardia, abdominal pain, anorexia.  The worrisome physical exam findings include tachycardia, dry mucus membranes, suprapubic tenderness.  The initial radiographic and laboratory data are worrisome because of E. Coli urine culture - sensitivities pending.  The chronic co-morbidities include Neurogenic Bladder requiring I&O catheterization multiple times a day, dementia, recent weight loss, diarrhea after recent antibiotics for recurrent UTI in February 2017,

## 2015-08-17 NOTE — ED Notes (Signed)
Phlebotomy at the bedside  

## 2015-08-17 NOTE — ED Notes (Signed)
[facility] contacted to transport patient to [facility] 

## 2015-08-17 NOTE — ED Notes (Signed)
[facility] at bedside 

## 2015-08-17 NOTE — Discharge Instructions (Signed)

## 2015-08-17 NOTE — ED Provider Notes (Signed)
CSN: [id number]     Arrival date & time 08/17/15  1454 History   First MD Initiated Contact with Patient 08/17/15 1511     Chief Complaint  Patient presents with  . Altered Mental Status      HPI Patient presents from [place] with possible UTI.  According to the wife patient has had confusion and dementia for quite some time and there is no change in his mental status.  He did have some tachycardia at the nursing facility.  No fever or chills that the wife is aware of. Past Medical History  Diagnosis Date  . Hypertension   . Hyperlipidemia   . Anxiety   . Anemia 11-08-12    mild anemia  . Stroke [facility]) 11-08-12    x2 , last 09-29-11(confusion,left side weakness) no residual"some memory problems.  . Chronic kidney disease     must self cath  . Encephalopathy acute 06/17/2015  . Depression 10/04/2011  . Diabetes mellitus, type 2 ([place]) 10/02/2011  . Falls frequently 06/22/2015  . HTN (hypertension) 10/02/2011  . Incontinence 06/22/2015  . Leg weakness 02/20/2015  . Dementia 06/22/2015  . CVA (cerebral infarction) 10/02/2011  . Anxiety state 06/22/2015  . Agitation 02/20/2015  . Acute encephalopathy 06/16/2015  . Colon polyps   . Fecal incontinence   . UTI (lower urinary tract infection) 06/16/15; 07/25/14  . Infection of urinary tract 07/25/2015  . Fracture of rib of right side 06/15/2015    8th right posterior rib fracture    Past Surgical History  Procedure Laterality Date  . Back surgery      1996- metal plate and screws for degenerative changes spine  . Colonoscopy with propofol N/A 01/12/2013    Procedure: COLONOSCOPY WITH PROPOFOL;  Surgeon: Arta Silence, MD;  Location: [facility] ENDOSCOPY;  Service: Endoscopy;  Laterality: N/A;   Family History  Problem Relation Age of Onset  . Dementia Mother   . Alzheimer's disease Mother   . Stroke Mother   . Lung cancer Father   . Colon cancer Maternal Grandmother    Social History  Substance Use Topics  . Smoking status: Former  Smoker -- 1.00 packs/day for 5 years    Types: Cigarettes  . Smokeless tobacco: Former Systems developer    Types: [place] date: 05/26/1962  . Alcohol Use: Yes     Comment: only rarely    Review of Systems  Unable to perform ROS: Dementia      Allergies  Ciprofloxacin and Vicodin  Home Medications   Prior to Admission medications   Medication Sig Start Date End Date Taking? Authorizing Provider  aspirin 81 MG chewable tablet Chew 81 mg by mouth daily.   Yes Historical Provider, MD  atorvastatin (LIPITOR) 40 MG tablet Take 40 mg by mouth at bedtime. 07/19/15  Yes Historical Provider, MD  divalproex (DEPAKOTE ER) 500 MG 24 hr tablet Take 1 tablet (500 mg total) by mouth 2 (two) times daily. 05/18/15  Yes Garvin Fila, MD  ibuprofen (ADVIL,MOTRIN) 200 MG tablet Take 200 mg by mouth every 6 (six) hours as needed for pain. Reported on 07/03/2015   Yes Historical Provider, MD  Insulin Glargine (LANTUS SOLOSTAR) 100 UNIT/ML SOPN Inject 35 Units into the skin daily at 10 pm.    Yes Historical Provider, MD  lisinopril (PRINIVIL,ZESTRIL) 5 MG tablet Take 5 mg by mouth every morning.   Yes Historical Provider, MD  Melatonin 3 MG TABS Take 3 mg by mouth every  evening.   Yes Historical Provider, MD  Multiple Vitamin (MULTIVITAMIN WITH MINERALS) TABS Take 1 tablet by mouth daily.   Yes Historical Provider, MD  pantoprazole (PROTONIX) 40 MG tablet Take 40 mg by mouth daily.  11/27/14  Yes Historical Provider, MD  PARoxetine (PAXIL) 40 MG tablet Take 40 mg by mouth at bedtime.    Yes Historical Provider, MD  senna (SENOKOT) 8.6 MG TABS tablet Take 2 tablets by mouth at bedtime.   Yes Historical Provider, MD  Sodium Phosphates (EQ ENEMA RE) Place 1 enema rectally every other day. For 3 "soap suds" enemas for constipation   Yes Historical Provider, MD  venlafaxine XR (EFFEXOR-XR) 75 MG 24 hr capsule Take 75 mg by mouth daily. 08/16/15  Yes Historical Provider, MD  Wheat Dextrin (BENEFIBER PO) Take 7.5 mLs by  mouth daily. Reported on 08/17/2015   Yes Historical Provider, MD  cephALEXin (KEFLEX) 500 MG capsule Take 1 capsule (500 mg total) by mouth 4 (four) times daily. 08/17/15   Leonard Schwartz, MD  ONE TOUCH ULTRA TEST test strip  01/05/13   Historical Provider, MD   BP 121/78 mmHg  Pulse 95  Temp(Src) 98.4 F (36.9 C)  Resp 24  SpO2 97% Physical Exam  Constitutional: He appears well-developed and well-nourished. No distress.  HENT:  Head: Normocephalic and atraumatic.  Eyes: Pupils are equal, round, and reactive to light.  Neck: Normal range of motion.  Cardiovascular: Normal rate and intact distal pulses.   Pulmonary/Chest: No respiratory distress.  Abdominal: Normal appearance. He exhibits no distension. There is no tenderness. There is no rebound.  Musculoskeletal: Normal range of motion.  Neurological: He is alert. No cranial nerve deficit.  Skin: Skin is warm and dry. No rash noted.  Psychiatric: He has a normal mood and affect. His behavior is normal.  Nursing note and vitals reviewed.   ED Course  Procedures (including critical care time) Medications  cefTRIAXone (ROCEPHIN) 1 g in dextrose 5 % 50 mL IVPB (1 g Intravenous New Bag/Given 08/17/15 1840)    Labs Review Labs Reviewed  COMPREHENSIVE METABOLIC PANEL - Abnormal; Notable for the following:    Glucose, Bld 209 (*)    Total Protein 5.8 (*)    Albumin 2.6 (*)    All other components within normal limits  CBC WITH DIFFERENTIAL/PLATELET - Abnormal; Notable for the following:    RBC 4.08 (*)    Hemoglobin 11.3 (*)    HCT 35.3 (*)    Monocytes Absolute 2.2 (*)    All other components within normal limits  URINALYSIS, ROUTINE W REFLEX MICROSCOPIC (NOT AT [facility]) - Abnormal; Notable for the following:    APPearance TURBID (*)    Glucose, UA 500 (*)    Hgb urine dipstick MODERATE (*)    Protein, ur 100 (*)    Leukocytes, UA LARGE (*)    All other components within normal limits  URINE MICROSCOPIC-ADD ON - Abnormal;  Notable for the following:    Squamous Epithelial / LPF 0-5 (*)    Bacteria, UA MANY (*)    All other components within normal limits  URINE CULTURE    Imaging Review Dg Chest 2 View  08/17/2015  CLINICAL DATA:  AMS. ED notes state: pt coming in from [place] facility for increased confusion. Pt was admitted to hospital in January for similar symptoms and it was found to be a UTI. Pt denies any chest complaints at this time. EXAM: CHEST  2 VIEW COMPARISON:  Radiograph  10/02/2011 FINDINGS: Normal cardiac silhouette. No effusion, infiltrate, pneumothorax. Round calcific density seen on lateral projection measuring 5 mm projecting over mid thoracic spine unchanged from prior consistent with a pulmonary granuloma or bone island. Mild bronchitic markings and peribronchial thickening centrally are unchanged. IMPRESSION: 1.  No acute cardiopulmonary process. 2. Chronic bronchitic markings and peribronchial thickening Electronically Signed   By: Suzy Bouchard M.D.   On: 08/17/2015 15:45   I have personally reviewed and evaluated these images and lab results as part of my medical decision-making.    MDM   Final diagnoses:  UTI (lower urinary tract infection)        Leonard Schwartz, MD 08/17/15 1958

## 2015-08-17 NOTE — ED Notes (Signed)
Pt returned from X-ray.  

## 2015-08-20 ENCOUNTER — Encounter: Payer: Self-pay | Admitting: Family Medicine

## 2015-08-20 ENCOUNTER — Non-Acute Institutional Stay: Payer: Medicare Other | Admitting: Family Medicine

## 2015-08-20 DIAGNOSIS — N390 Urinary tract infection, site not specified: Secondary | ICD-10-CM | POA: Diagnosis not present

## 2015-08-20 LAB — URINE CULTURE

## 2015-08-20 NOTE — Progress Notes (Signed)
   Subjective:    Patient ID: Reginald Patterson, male    DOB: August 23, 1940, 75 y.o.   MRN: [id number] Reginald Patterson is alone Sources of clinical information for visit is/are patient, nursing home, past medical records and ED visit 08/17/15 and labs . Discussed patient condition over weekend with floor nurse.   HPI UTI - Onset of symptoms around 08/15/15 with family noticing patient more confused. - Other symptoms noted on 08/17/15 were bilateral lower quadrant abdominal aching. - No fever.  (+) Reported true rigors on 08/17/15.  Decreased appetits. - Seen at [facility] ED and dx with UTI without concern for sepsis. Discharged back to [facility] from ED. Rx Keflex per oral. - Pt reports feeling well, better than he felt couple days ago. Pt ate well this morning.  He recalled what he ate.  - Nursing notes reports patient without aggitation over weekend.  (+) unoriented to date and location over weekend, but pleasant and cooperative.  - No fever or chills noted by nursing staff over weekend.  - Lab results from ED reviewed. Significant for Urinalysis with TNTC WBC without squames and GNR on urine culture from ED.  CBC without leukocytosis and only mild [place] anemia.  CMET showed low albumin at 2.6 g/dL (down from 3.3 g/dL on 06/16/15) and total Protein low at 5.8 g/dL (unchaged from 06/16/15).  - Stool culture from early in March has not shown growth.   SH: No smoking.  Medication: List reviewed and updated.  Review of Systems  No nausea/vomiting Stools mixed formed and loose per patient.      Objective:   Physical Exam VS reviewed, (+) HOH GEN: Alert, Cooperative, Groomed, NAD, attnetive throughtout interview.   HEENT: edentulous, no mucosal lesions COR: RRR, No gallup LUNGS: BCTA, No Acc mm use, speaking in full sentences ABDOMEN: (+)BS, soft, NT, ND, No HSM, No palpable masses, no suprapubic tenderness Habitus: sitting in WC unsupported,  EXT: appropriate warmth of legs and hands. No edema- Psych:  Normal affect/thought/speech/language    Assessment & Plan:

## 2015-08-20 NOTE — Assessment & Plan Note (Signed)
Follow up of new problem Improved symptomatically. Urine culture 3/22 grew E.coli non-reported sensitivity to Keflex.  Sensitive to Ampicillin.   Stopped Keflex.  Start Amoxicillin 500 mg QID per oral for 14 days given complicated UTI in patient with Neurogenic bladder.

## 2015-08-21 ENCOUNTER — Telehealth (HOSPITAL_BASED_OUTPATIENT_CLINIC_OR_DEPARTMENT_OTHER): Payer: Self-pay | Admitting: Emergency Medicine

## 2015-08-21 NOTE — Telephone Encounter (Signed)
Post ED Visit - Positive Culture Follow-up  Culture report reviewed by antimicrobial stewardship pharmacist:  []  Elenor Quinones, Pharm.D. []  Heide Guile, Pharm.D., BCPS []  Parks Neptune, Pharm.D. []  Alycia Rossetti, Pharm.D., BCPS []  [place], [place].D., BCPS, AAHIVP []  Legrand Como, Pharm.D., BCPS, AAHIVP []  Milus Glazier, Pharm.D. []  Rob Evette Doffing, [place].DGovernor Specking PharmD  Positive urine culture Treated with cephalexin, organism sensitive to the same and no further patient follow-up is required at this time.  Hazle Nordmann 08/21/2015, 10:22 AM

## 2015-08-23 ENCOUNTER — Encounter: Payer: Self-pay | Admitting: Pharmacist

## 2015-08-24 ENCOUNTER — Telehealth: Payer: Self-pay | Admitting: Family Medicine

## 2015-08-24 NOTE — Telephone Encounter (Signed)
AFTER HOURS LINE  Paged by nurse at [facility]. Patient has significant BPH with obstruction and is unable to urinate on his own.  He has been getting I&O caths 5x/day (each resulting anywhere from 500-800cc of urine). Unfortunately they do not have any more straight caths. Per the nursing director, they will not get any more in until Monday. He is currently on amoxicillin for a complicated Ecoli UTI. Gave orders to place urine foley. Nurses to remove foley on Monday- I stressed the importance of this as the patient is high risk for repeat infections.   FYI sent to oncoming geriatrics resident and Dr. Ree Kida (on coming geriatrics attending).   Archie Patten, MD [facility] Family Medicine Resident  08/24/2015, 7:29 PM

## 2015-08-27 ENCOUNTER — Encounter: Payer: Self-pay | Admitting: Family Medicine

## 2015-08-27 DIAGNOSIS — N312 Flaccid neuropathic bladder, not elsewhere classified: Secondary | ICD-10-CM | POA: Insufficient documentation

## 2015-08-27 HISTORY — DX: Flaccid neuropathic bladder, not elsewhere classified: N31.2

## 2015-08-27 NOTE — Progress Notes (Signed)
Patient ID: Reginald Patterson, male   DOB: [birth year], 75 y.o.   MRN: [id number] I have interviewed and examined the patient and spoken with patient's family (wife, 2 dgts and 1 son).  I have discussed the case and verified the key findings with Dr. Gerlean Ren.   I agree with their assessments and plans as documented in their visit note.   Donepezil stopped because of weight loss.  Titrating off Effexor, continue Paxil though may want discuss with family about using a less anticholinergic SSRI.  Patient has Care plan for acute illness (See MOST) for in chart. Patient may be hospitalized.  No ventilation, No pressors.  No PEG tube.

## 2015-08-31 ENCOUNTER — Telehealth: Payer: Self-pay | Admitting: *Deleted

## 2015-08-31 NOTE — Telephone Encounter (Signed)
Patient son calls stating that patient is followed by Dr. Wendy Poet at [facility] and they need a letter from MD stating that patient is no longer able to handle his own affairs foer power of attorney purposes. Son states you can call him with any questions.

## 2015-09-04 NOTE — Telephone Encounter (Signed)
Please call patient's son, Shirlean Mylar [phone]) to let him know he can pick up the requested letter. A copy of the letter was also mailed to Mr Karapetyan's wife.

## 2015-09-20 ENCOUNTER — Non-Acute Institutional Stay (INDEPENDENT_AMBULATORY_CARE_PROVIDER_SITE_OTHER): Payer: Medicare Other | Admitting: Family Medicine

## 2015-09-20 DIAGNOSIS — N138 Other obstructive and reflux uropathy: Secondary | ICD-10-CM

## 2015-09-20 DIAGNOSIS — N401 Enlarged prostate with lower urinary tract symptoms: Secondary | ICD-10-CM

## 2015-09-20 DIAGNOSIS — N312 Flaccid neuropathic bladder, not elsewhere classified: Secondary | ICD-10-CM

## 2015-09-21 NOTE — Progress Notes (Signed)
[facility] Team Progress Note  Subjective:    Patient ID: Reginald Patterson, male    DOB: [birth year], 75 y.o.   MRN: [id number]  Reginald Patterson is a 75 y.o. male presenting on 09/20/2015 for Benign Prostatic Hypertrophy   HPI  BPH with Urinary Obstruction / Neurogenic/Flaccid Bladder - Checked on patient 09/20/15 after discussing with RN that concern about frequent clean intermittent catheterization at x 5 daily, seems to bother patient with this frequency, he also has PRN CIC ordered. Chart review on patient shows prior to 05/2015 he had indwelling foley and developed CAUTI and hospitalization, since then he has had at least one more UTI considered complicated given foley catheters, and he has been on a 3x daily to 5x daily CIC schedule. Last saw Urology in 2016 Dr Karsten Ro (I cannot view this record). Our geriatrics team had discussed starting tamsulosin and voiding trial in 06/2015, however I do not see that this was completed. - Patient today has no concerns, he is in good spirits and agrees about the frequency of catheterization. He does drink fluids regularly. - Denies any recent fevers/chills, sweats, back or flank pain, hematuria, urinary urge or dysuria   Social History  Substance Use Topics  . Smoking status: Former Smoker -- 1.00 packs/day for 5 years    Types: Cigarettes  . Smokeless tobacco: Former Systems developer    Types: [place] date: 05/26/1962  . Alcohol Use: Yes     Comment: only rarely    Review of Systems Per HPI unless specifically indicated above     Objective:    There were no vitals taken for this visit.  Wt Readings from Last 3 Encounters:  08/17/15 156 lb (70.761 kg)  08/12/15 157 lb (71.215 kg)  07/25/15 172 lb 8 oz (78.245 kg)    Physical Exam  Constitutional: He appears well-developed and well-nourished. No distress.  Well-appearing, comfortable, in wheelchair able to self propel, conversational  HENT:  Mouth/Throat: Oropharynx  is clear and moist.  Cardiovascular: Normal rate and intact distal pulses.   Pulmonary/Chest: Effort normal.  Abdominal: Soft. He exhibits no distension and no mass. There is no tenderness. There is no rebound and no guarding.  Musculoskeletal: He exhibits no edema.  Neurological: He is alert.  Skin: Skin is warm and dry. No rash noted. He is not diaphoretic.  Nursing note and vitals reviewed.      Assessment & Plan:   Problem List Items Addressed This Visit    Neurogenic bladder, flaccid (Chronic)   BPH (benign prostatic hypertrophy) with urinary obstruction - Primary    Chronic problem with urinary obstruction, likely exacerbated by h/o neurogenic/flaccid bladder, he seems to be catheter dependent at this time and unable to spontaneous void. - No recent trial alpha blockers / 5alpha reductase inhibitor - Currently no evidence of UTI  Plan: 1. Order written to reduce scheduled CIC from 5 x daily down to 3 x daily + PRN (unchanged, separate order) 2. Start Tamsulosin 0.4mg  daily 3. Start Finasteride 5mg  daily 4. Follow-up within 1-2 weeks see if tolerating new CIC schedule, can check bladder scan, monitor BP on flomax, consider spontaneous voiding trial after 3 mo (11/2015 or later) once on finasteride to see if any improvement, follow-up with Urology as needed         No orders of the defined types were placed in this encounter.      Follow up plan: No Follow-up on file.  Nobie Putnam, [place], PGY-3

## 2015-09-21 NOTE — Progress Notes (Signed)
I have discussed this management with Dr. Parks Ranger.  Agree with his documentation and plan.

## 2015-09-21 NOTE — Assessment & Plan Note (Addendum)
Chronic problem with urinary obstruction, likely exacerbated by h/o neurogenic/flaccid bladder, he seems to be catheter dependent at this time and unable to spontaneous void. - No recent trial alpha blockers / 5alpha reductase inhibitor - Currently no evidence of UTI  Plan: 1. Order written to reduce scheduled CIC from 5 x daily down to 3 x daily + PRN (unchanged, separate order) 2. Start Tamsulosin 0.4mg  daily 3. Start Finasteride 5mg  daily 4. Follow-up within 1-2 weeks see if tolerating new CIC schedule, can check bladder scan, monitor BP on flomax, consider spontaneous voiding trial after 3 mo (11/2015 or later) once on finasteride to see if any improvement, follow-up with Urology as needed

## 2015-09-22 ENCOUNTER — Encounter: Payer: Self-pay | Admitting: Family Medicine

## 2015-10-05 ENCOUNTER — Encounter: Payer: Self-pay | Admitting: Pharmacist

## 2015-11-16 ENCOUNTER — Encounter: Payer: Self-pay | Admitting: Pharmacist

## 2015-12-03 ENCOUNTER — Non-Acute Institutional Stay (INDEPENDENT_AMBULATORY_CARE_PROVIDER_SITE_OTHER): Payer: Medicare Other | Admitting: Family Medicine

## 2015-12-03 DIAGNOSIS — R635 Abnormal weight gain: Secondary | ICD-10-CM

## 2015-12-03 NOTE — Progress Notes (Signed)
[facility] Geriatrics Progress Note Subjective: CC: weight gain HPI: Patient is a 75 y.o. male with a past medical history of her genetic bladder, BPH, vascular dementia, frequent falls, CVA, hypertension, and diabetes mellitus with concerns for increased weight gain.  The patient's weight is currently 180.2 pounds, up 9.48% in the last 59 days (15.6 pounds).  The patient states that he is eating well which is why his weight is improved. He is no longer having diarrhea.    The patient denies any short of breath, chest pain, or cough. He denies any orthopnea or PND. He has some lower extremity swelling, but his wife notes is because he does not wear the compression stockings that she provides him.   Past Medical History Patient Active Problem List   Diagnosis Date Noted  . Neurogenic bladder, flaccid   . Complicated UTI (urinary tract infection) 08/20/2015  . Weight loss, unintentional 07/25/2015  . Colon polyps   . Fecal incontinence   . 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 Falls frequently 06/22/2015  . Incontinence 06/22/2015  . Leg weakness 02/20/2015  . Chronic low back pain 11/24/2014  . Anemia 10/04/2011  . Depression 10/04/2011  . CVA (cerebral infarction) 10/02/2011  . HTN (hypertension) 10/02/2011  . Hyperlipidemia 10/02/2011  . Diabetes mellitus, type 2 ([place]) 10/02/2011    Medications- reviewed and updated Current Outpatient Prescriptions  Medication Sig Dispense Refill  . aspirin 81 MG chewable tablet Chew 81 mg by mouth daily.    Marland Kitchen atorvastatin (LIPITOR) 40 MG tablet Take 40 mg by mouth at bedtime.    . Calcium Citrate-Vitamin D (CALCIUM CITRATE + D PO) Take 1,000 mg by mouth daily. For low Ca levels    . divalproex (DEPAKOTE ER) 500 MG 24 hr tablet Take 1 tablet (500 mg total) by mouth 2 (two) times daily. 30 tablet 3  . finasteride  (PROSCAR) 5 MG tablet Take 5 mg by mouth daily.    Marland Kitchen ibuprofen (ADVIL,MOTRIN) 200 MG tablet Take 200 mg by mouth every 6 (six) hours as needed for pain. Reported on 08/20/2015    . Insulin Glargine (LANTUS SOLOSTAR) 100 UNIT/ML SOPN Inject 35 Units into the skin daily at 10 pm.     . lisinopril (PRINIVIL,ZESTRIL) 5 MG tablet Take 5 mg by mouth every morning.    . Melatonin 3 MG TABS Take 3 mg by mouth every evening.    . Multiple Vitamin (MULTIVITAMIN WITH MINERALS) TABS Take 1 tablet by mouth daily.    . ONE TOUCH ULTRA TEST test strip     . pantoprazole (PROTONIX) 20 MG tablet Take 20 mg by mouth daily.    Marland Kitchen PARoxetine (PAXIL) 40 MG tablet Take 40 mg by mouth at bedtime.     . senna (SENOKOT) 8.6 MG TABS tablet Take 2 tablets by mouth at bedtime.    . tamsulosin (FLOMAX) 0.4 MG CAPS capsule Take 0.8 mg by mouth daily.    Marland Kitchen venlafaxine XR (EFFEXOR-XR) 75 MG 24 hr capsule Take 75 mg by mouth daily.    . Wheat Dextrin (BENEFIBER PO) Take 7.5 mLs by mouth daily. Reported on 08/17/2015     No current facility-administered medications for this visit.    Objective:   Physical Examination:  General: Awake, alert, well- nourished, NAD sitting up in a wheel chair with his wife at his side. Cardio: RRR, no m/r/g noted. No JVD noted.  Pulm: No increased WOB.  CTAB, without wheezes, rhonchi or crackles noted.  Extremities: 1+ pitting edema noted up to mid-shin bilaterally.   Assessment/Plan: Weight gain: The patient has had a 9.48% increase in his weight in the last 59 days.  From looking at old records, she weighed 185 pounds in July 2016.  He does not look extra midline overloaded on my exam. He does have 1+ pitting edema of the lower extremities. His last echo was in 2013 and was normal, with an EF of 50-55%.  We'll continue to monitor the patient's weight gain and other signs or symptoms of volume overload.   Archie Patten PGY-2, [place]

## 2015-12-06 ENCOUNTER — Non-Acute Institutional Stay (INDEPENDENT_AMBULATORY_CARE_PROVIDER_SITE_OTHER): Payer: Medicare Other | Admitting: Family Medicine

## 2015-12-06 DIAGNOSIS — R32 Unspecified urinary incontinence: Secondary | ICD-10-CM

## 2015-12-06 DIAGNOSIS — F32A Depression, unspecified: Secondary | ICD-10-CM

## 2015-12-06 DIAGNOSIS — F0390 Unspecified dementia without behavioral disturbance: Secondary | ICD-10-CM

## 2015-12-06 DIAGNOSIS — Z794 Long term (current) use of insulin: Secondary | ICD-10-CM

## 2015-12-06 DIAGNOSIS — E118 Type 2 diabetes mellitus with unspecified complications: Secondary | ICD-10-CM

## 2015-12-06 DIAGNOSIS — F329 Major depressive disorder, single episode, unspecified: Secondary | ICD-10-CM

## 2015-12-06 DIAGNOSIS — E785 Hyperlipidemia, unspecified: Secondary | ICD-10-CM

## 2015-12-06 DIAGNOSIS — I10 Essential (primary) hypertension: Secondary | ICD-10-CM

## 2015-12-06 NOTE — Progress Notes (Signed)
Subjective:     Patient ID: Reginald Patterson, male   DOB: 1940/08/29, 75 y.o.   MRN: [id number]  HPI [name] is a 75yo male currently residing in [facility]. Prior history of stroke, diabetes, hypertension, dementia, and CKD Stage II noted.   Prior to admission to nursing home, he was admitted to the hospital for confusion and agitation and noted to have a UTI. Cultures showed pan sensitive S. aureus. He was treated with Rocephin and transitioned to Keflex. Fall was noted prior to admission and fracture of 8th right rib noted on xray during hospitalization. Confusion and agitation improved prior to discharge from hospital.   Diagnosed with dementia three years ago. Has been becoming more verbally abuse and has had several falls, making it difficult for his wife to care for. Wife was unable to help him up after falls. Needed assistance for bathing, dressing, and toileting. Eats independently.  Was completely dependent on caretaker for cooking, housework, managing medications, shopping, transportation, and finances.   Since last visit, was noted to have frequent in and out catheterizations >5x per day. This was decreased to 3x per day on 08/2015. Recommended voiding trial in 11/2015. Flomax and Proscar were also initiated.   Wife is health care power of attorney. Currently DNR/DNI.  Denies any current concerns. Sleeping comfortably, but pleasant when awakened. Appreciate care given by geriatrics team.   Review of Systems Per HPI. Other systems negative.    Objective:   Physical Exam  Constitutional: He is oriented to person, place, and time. He appears well-developed and well-nourished. No distress.  HENT:  Head: Normocephalic and atraumatic.  Cardiovascular: Normal rate.  Exam reveals no gallop and no friction rub.   No murmur heard. Pulmonary/Chest: Effort normal. No respiratory distress. He has no wheezes.  Abdominal: Soft. Bowel sounds are normal. He exhibits no distension.  There is no tenderness.  Musculoskeletal: He exhibits no edema.  Neurological: He is alert and oriented to person, place, and time.  Skin: No rash noted.  Psychiatric: He has a normal mood and affect. His behavior is normal.      Assessment and Plan:      # Hypertension: - Lisinopril 5mg  - Check blood pressures weekly  # Diabetes: - Metformin discontinued due to diarrhea, weight loss - Lantus 35units daily  # Depression/Anxiety: - Paxil 40mg . Currently tapering off. - Effexor 75mg   # Hyperlipidemia: - Continue Atorvastatin  # Dementia: - Depakote - Melatonin 3mg  nightly to help with sleep - Trial of Donepezile discontinued due to weight loss. - Fall precautions  # Incontinence with h/o Mechanical Balanitis/BPH: - Proscar 5mg , Flomax 0.8mg  - In and Out catheterization three times daily + as needed - Voiding Trial due this month  # Constipation: - Senokot 2 tablets daily  # Hypocalcemia: - Calcium Citrate-Vitamin D  # Weight Loss: - Improved  - Monitor weight - Nutritionist consulted

## 2015-12-08 DIAGNOSIS — F0390 Unspecified dementia without behavioral disturbance: Secondary | ICD-10-CM | POA: Insufficient documentation

## 2015-12-15 ENCOUNTER — Telehealth: Payer: Self-pay | Admitting: Internal Medicine

## 2015-12-15 NOTE — Telephone Encounter (Signed)
[place] After Hours Telephone Line   Nurse called from [place] to report a fall. States there is no concern for fracture including swelling or bruising. Patient recovered from fall without issues other than small abrasion on his knee.   Kerrin Mo, MD  PGY-2 [facility] Pager [phone]

## 2015-12-19 ENCOUNTER — Encounter: Payer: Self-pay | Admitting: Family Medicine

## 2016-01-11 ENCOUNTER — Encounter: Payer: Self-pay | Admitting: Pharmacist

## 2016-01-14 ENCOUNTER — Encounter: Payer: Self-pay | Admitting: Pharmacist

## 2016-01-20 ENCOUNTER — Encounter (HOSPITAL_COMMUNITY): Payer: Self-pay | Admitting: Emergency Medicine

## 2016-01-20 ENCOUNTER — Emergency Department (HOSPITAL_COMMUNITY)
Admission: EM | Admit: 2016-01-20 | Discharge: 2016-01-20 | Disposition: A | Discharge status: Home / Self Care | Payer: Medicare Other | Attending: Emergency Medicine | Admitting: Emergency Medicine

## 2016-01-20 ENCOUNTER — Telehealth: Payer: Self-pay | Admitting: Student

## 2016-01-20 DIAGNOSIS — N390 Urinary tract infection, site not specified: Secondary | ICD-10-CM | POA: Diagnosis not present

## 2016-01-20 DIAGNOSIS — Z466 Encounter for fitting and adjustment of urinary device: Secondary | ICD-10-CM | POA: Diagnosis present

## 2016-01-20 DIAGNOSIS — Z960 Presence of urogenital implants: Secondary | ICD-10-CM | POA: Insufficient documentation

## 2016-01-20 DIAGNOSIS — Z79899 Other long term (current) drug therapy: Secondary | ICD-10-CM | POA: Insufficient documentation

## 2016-01-20 DIAGNOSIS — Z794 Long term (current) use of insulin: Secondary | ICD-10-CM | POA: Insufficient documentation

## 2016-01-20 DIAGNOSIS — N189 Chronic kidney disease, unspecified: Secondary | ICD-10-CM | POA: Insufficient documentation

## 2016-01-20 DIAGNOSIS — Z87891 Personal history of nicotine dependence: Secondary | ICD-10-CM | POA: Insufficient documentation

## 2016-01-20 DIAGNOSIS — Z7982 Long term (current) use of aspirin: Secondary | ICD-10-CM | POA: Diagnosis not present

## 2016-01-20 DIAGNOSIS — I129 Hypertensive chronic kidney disease with stage 1 through stage 4 chronic kidney disease, or unspecified chronic kidney disease: Secondary | ICD-10-CM | POA: Insufficient documentation

## 2016-01-20 DIAGNOSIS — E1122 Type 2 diabetes mellitus with diabetic chronic kidney disease: Secondary | ICD-10-CM | POA: Diagnosis not present

## 2016-01-20 DIAGNOSIS — Z978 Presence of other specified devices: Secondary | ICD-10-CM

## 2016-01-20 LAB — URINE MICROSCOPIC-ADD ON

## 2016-01-20 LAB — URINALYSIS, ROUTINE W REFLEX MICROSCOPIC
Bilirubin Urine: NEGATIVE
Glucose, UA: 500 mg/dL — AB
Ketones, ur: NEGATIVE mg/dL
Nitrite: NEGATIVE
Protein, ur: NEGATIVE mg/dL
Specific Gravity, Urine: 1.012 (ref 1.005–1.030)
pH: 6 (ref 5.0–8.0)

## 2016-01-20 MED ORDER — CEPHALEXIN 500 MG PO CAPS
500.0000 mg | ORAL_CAPSULE | Freq: Once | ORAL | Status: AC
  Administered 2016-01-20: 500 mg via ORAL
  Filled 2016-01-20: qty 1

## 2016-01-20 MED ORDER — CEPHALEXIN 500 MG PO CAPS: 500.0000 mg | ORAL_CAPSULE | Freq: Three times a day (TID) | ORAL | 0 refills | Status: DC

## 2016-01-20 NOTE — ED Notes (Signed)
Bed: [id number] Expected date:  Expected time:  Means of arrival:  Comments: EMS-pulled out foley

## 2016-01-20 NOTE — Discharge Instructions (Signed)
Keep foley in for 48 hours. Remove on 8/29 and then resume straight catheterizations as previously done.

## 2016-01-20 NOTE — Telephone Encounter (Signed)
Nurse called because Reginald Patterson's urinary catheter was pulled from his urethra after he stumbled. His nurse caught him and he did not fall to the floor but his urnary catheter was pulled out. After this, he had copious bright red blood from his urethra. The nurse replaced the foley and noted continued blood leaking into the foley bag. He then called the on call resident for recommendations. He was told to send the patient to the ED for concern for urethral trauma and need for urgent Urology consultation. He stated his understanding  Jovie Swanner A. Lincoln Brigham MD, [facility] Resident PGY-3 Pager [phone]

## 2016-01-20 NOTE — ED Provider Notes (Signed)
[facility] DEPT Provider Note   CSN: [id number] Arrival date & time: 01/20/16  1613  By signing my name below, I, Reola Mosher, attest that this documentation has been prepared under the direction and in the presence of Virgel Manifold, MD. Electronically Signed: Reola Mosher, ED Scribe. 01/20/16. 4:35 PM.  History   Chief Complaint Chief Complaint  Patient presents with  . Other    Foley Dislodged    The history is provided by the patient, the EMS personnel, the nursing home and a relative. No language interpreter was used.   HPI Comments: Reginald Patterson is a 75 y.o. male BIB EMS from [place], with a PMHx significant of Foley catheter placement, recurrent UTIs, and dementia, who presents to the Emergency Department complaining of his Foley catheter becoming displaced. Pt's family member reports that the pt was sitting on his toilet at [facility] while his catheter was in place. While sitting on his toilet he fell asleep, causing him to fall forward and strike the ground. Upon striking the ground his catheter was pulled further out. No LOC, or noted injuries; however, his relative notes that initially there was blood coming from the site around his catheter which has gradually improved. Bleeding is controlled. He denies any pain, or other associated symptoms.   His relative also notes that she has noticed the patient being increasingly more weak x ~1 week. She states that he has a hx of UTIs due to his catheter placements, with similar episodes of weakness during his UTIs.   Past Medical History:  Diagnosis Date  . Acute encephalopathy 06/16/2015  . Agitation 02/20/2015  . Anemia 11-08-12   mild anemia  . Anxiety   . Anxiety state 06/22/2015  . Chronic kidney disease    must self cath  . Colon polyps   . CVA (cerebral infarction) 10/02/2011  . Dementia 06/22/2015  . Depression 10/04/2011  . Diabetes mellitus, type 2 ([place]) 10/02/2011  . Encephalopathy acute 06/17/2015  .  Falls frequently 06/22/2015  . Fecal incontinence   . Fracture of rib of right side 06/15/2015   8th right posterior rib fracture   . HTN (hypertension) 10/02/2011  . Hyperlipidemia   . Hypertension   . Incontinence 06/22/2015  . Infection of urinary tract 07/25/2015  . Leg weakness 02/20/2015  . 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 Stroke [facility]) 11-08-12   x2 , last 09-29-11(confusion,left side weakness) no residual"some memory problems.  Marland Kitchen UTI (lower urinary tract infection) 06/16/15; 07/25/14   Patient Active Problem List   Diagnosis Date Noted  . Dementia 12/08/2015  . Neurogenic bladder, flaccid   . Complicated UTI (urinary tract infection) 08/20/2015  . Weight loss, unintentional 07/25/2015  . Colon polyps   . Fecal incontinence   . 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 Falls frequently 06/22/2015  . Incontinence 06/22/2015  . Leg weakness 02/20/2015  . Chronic low back pain 11/24/2014  . Anemia 10/04/2011  . Depression 10/04/2011  . CVA (cerebral infarction) 10/02/2011  . HTN (hypertension) 10/02/2011  . Hyperlipidemia 10/02/2011  . Diabetes mellitus, type 2 ([place]) 10/02/2011   Past Surgical History:  Procedure Laterality Date  . [place]- metal plate and screws for degenerative changes spine  . COLONOSCOPY WITH PROPOFOL N/A 01/12/2013   Procedure: COLONOSCOPY WITH PROPOFOL;  Surgeon: Arta Silence, MD;  Location: [facility] ENDOSCOPY;  Service: Endoscopy;  Laterality: N/A;  Home Medications    Prior to Admission medications   Medication Sig Start Date End Date Taking? Authorizing Provider  aspirin 81 MG chewable tablet Chew 81 mg by mouth daily.    Historical Provider, MD  atorvastatin (LIPITOR) 40 MG tablet Take 40 mg by mouth at bedtime. 07/19/15   Historical Provider, MD  Calcium Citrate-Vitamin D (CALCIUM CITRATE + D PO) Take 1,000 mg by mouth daily. For low Ca  levels    Historical Provider, MD  divalproex (DEPAKOTE ER) 500 MG 24 hr tablet Take 1 tablet (500 mg total) by mouth 2 (two) times daily. 05/18/15   Garvin Fila, MD  finasteride (PROSCAR) 5 MG tablet Take 5 mg by mouth daily.    Historical Provider, MD  ibuprofen (ADVIL,MOTRIN) 200 MG tablet Take 200 mg by mouth every 6 (six) hours as needed for pain. Reported on 08/20/2015    Historical Provider, MD  Insulin Glargine (LANTUS SOLOSTAR) 100 UNIT/ML SOPN Inject 35 Units into the skin daily at 10 pm.     Historical Provider, MD  lisinopril (PRINIVIL,ZESTRIL) 5 MG tablet Take 5 mg by mouth every morning.    Historical Provider, MD  Melatonin 3 MG TABS Take 3 mg by mouth every evening.    Historical Provider, MD  Multiple Vitamin (MULTIVITAMIN WITH MINERALS) TABS Take 1 tablet by mouth daily.    Historical Provider, MD  ONE TOUCH ULTRA TEST test strip  01/05/13   Historical Provider, MD  pantoprazole (PROTONIX) 20 MG tablet Take 20 mg by mouth daily.    Historical Provider, MD  PARoxetine (PAXIL) 40 MG tablet Take 40 mg by mouth at bedtime.     Historical Provider, MD  senna (SENOKOT) 8.6 MG TABS tablet Take 2 tablets by mouth at bedtime.    Historical Provider, MD  tamsulosin (FLOMAX) 0.4 MG CAPS capsule Take 0.8 mg by mouth daily.    Historical Provider, MD  venlafaxine XR (EFFEXOR-XR) 75 MG 24 hr capsule Take 75 mg by mouth daily. 08/16/15   Historical Provider, MD  Wheat Dextrin (BENEFIBER PO) Take 7.5 mLs by mouth daily. Reported on 08/17/2015    Historical Provider, MD   Family History Family History  Problem Relation Age of Onset  . Dementia Mother   . Alzheimer's disease Mother   . Stroke Mother   . Lung cancer Father   . Colon cancer Maternal Grandmother    Social History Social History  Substance Use Topics  . Smoking status: Former Smoker    Packs/day: 1.00    Years: 5.00    Types: Cigarettes  . Smokeless tobacco: Former Systems developer    Types: Chew    Quit date: 05/26/1962  . Alcohol  use Yes     Comment: only rarely   Allergies   Ciprofloxacin and Vicodin [hydrocodone-acetaminophen]  Review of Systems Review of Systems  Constitutional: Negative for fever.  Genitourinary: Negative for penile pain.  Neurological: Positive for weakness. Negative for syncope.  All other systems reviewed and are negative.  Physical Exam Updated Vital Signs BP 129/59 (BP Location: Left Arm)   Pulse 70   Temp 98.1 F (36.7 C) (Oral)   Resp 20   SpO2 94%   Physical Exam  Constitutional: He appears well-nourished. No distress.  Pt is pleasantly confused.  HENT:  Head: Normocephalic and atraumatic.  Mouth/Throat: Oropharynx is clear and moist. No oropharyngeal exudate.  Eyes: Conjunctivae and EOM are normal. Pupils are equal, round, and reactive to light. Right eye exhibits no discharge.  Left eye exhibits no discharge. No scleral icterus.  Neck: Normal range of motion. Neck supple. No JVD present. No thyromegaly present.  Cardiovascular: Normal rate, regular rhythm, normal heart sounds and intact distal pulses.  Exam reveals no gallop and no friction rub.   No murmur heard. Pulmonary/Chest: Effort normal and breath sounds normal. No respiratory distress. He has no wheezes. He has no rales.  Abdominal: Soft. Bowel sounds are normal. He exhibits no distension and no mass. There is no tenderness.  Genitourinary:  Genitourinary Comments: Foley catheter in place. Small amount of dried blood noted on penis. No blood at meatus. Blood tinged urine in bag. Urine in tubing is clear.   Musculoskeletal: Normal range of motion. He exhibits no edema or tenderness.  Lymphadenopathy:    He has no cervical adenopathy.  Neurological: He is alert. Coordination normal.  Skin: Skin is warm and dry. No rash noted. No erythema.  Psychiatric: His behavior is normal.  Nursing note and vitals reviewed.  ED Treatments / Results  DIAGNOSTIC STUDIES: Oxygen Saturation is 94% on RA, adequate by my  interpretation.   COORDINATION OF CARE: 4:38 PM-Discussed next steps with pt. Pt verbalized understanding and is agreeable with the plan.   Labs (all labs ordered are listed, but only abnormal results are displayed) Labs Reviewed - No data to display  Procedures Procedures (including critical care time)  Medications Ordered in ED Medications - No data to display  Initial Impression / Assessment and Plan / ED Course  I have reviewed the triage vital signs and the nursing notes.  Pertinent labs & imaging results that were available during my care of the patient were reviewed by me and considered in my medical decision making (see chart for details).  Clinical Course   74yM with chronic foley. Likely minor trauma from traction on it. Urine now clear. Denies pain.   Final Clinical Impressions(s) / ED Diagnoses   Final diagnoses:  UTI (lower urinary tract infection)  Foley catheter status   New Prescriptions New Prescriptions   No medications on file    I personally preformed the services scribed in my presence. The recorded information has been reviewed is accurate. Virgel Manifold, MD.     Virgel Manifold, MD 02/03/16 [phone]

## 2016-01-20 NOTE — ED Triage Notes (Signed)
Patient from [place]. Slipped in restroom, foley came out. Reinserted by staff. No complaints at this time. Bleeding but controlled.

## 2016-01-21 ENCOUNTER — Non-Acute Institutional Stay: Payer: Medicare Other | Admitting: Family Medicine

## 2016-01-21 ENCOUNTER — Encounter: Payer: Self-pay | Admitting: Family Medicine

## 2016-01-21 DIAGNOSIS — N390 Urinary tract infection, site not specified: Secondary | ICD-10-CM | POA: Diagnosis not present

## 2016-01-21 DIAGNOSIS — R319 Hematuria, unspecified: Secondary | ICD-10-CM | POA: Diagnosis not present

## 2016-01-21 NOTE — Progress Notes (Signed)
[facility] Acute Visit  Reginald Patterson is accompanied by daughter and wife Sources of clinical information for visit is/are patient, spouse/SO, relative(s), nursing home and past medical records. Nursing assessment for this office visit was reviewed with the patient for accuracy and revision.    HISTORY OF PRESENT ILLNESS: Reginald Patterson is a 75 y.o.  male.      Chief Complaint  Patient presents with  . Hematuria    HPI:  Pt had traumatic extubation during usual chronic In & Out bladder catheterization for his neurogenic bladder. Patient's RN bladder catheterization was performed with patient in either standing or seated on commode position. Wife was standing outside the patient's BR during the catheterization.    [place] supervisor, [name], reports that it was reported to her that the patient made an abrupt movement with the bladder catheter intubating the bladder that resulted in the bladder catheter being pulled out and development of hematuria.  [name] recalls that the event was painful and recalls the blood in his urine, but he cannot recall was precipitated these experiences. The bladder catheter was replaced by the NH nurse.   [name] staff contacted the family medicine resident on call about the event and hematuria who instructed to transfer the patient to the ED for evaluation.  The resident note states that the [name] stumbled but did not fall.   The family expressed concern to me during my interview that the nurse who catheterization process resulted in the traumatic extubation regularly performs the patient's bladder catheterization with patient in standing or seated on the commode position while other nurses for the patient perform the bladder catheterization with the patient in the supine position on the patient's bed. They labeled the standing/seated positioning during I&O bladder catheterization as "unorthodox."  ED evaluation by Dr  [name] ED rports that patient fell asleep seated on the commode with bladder atheter in place. He fell off the commode and struck the ground and his bladder catheter was pulled out during the fall.   Prior to this event, there had been concern expressed by the patient's wife that the patient's usual normal level of consciousness was depressed and he seemed more confused than usual compared to his usual baseline cognition. His level of activity was reduced and he seemed to be generally weaker over the last week.  Patient has history of similar presentations that have improved after treatment for possible UTIs.    History Patient Active Problem List   Diagnosis Date Noted  . Colon polyps     Priority: High  . 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 Diabetes mellitus, type 2 ([place]) 10/02/2011    Priority: High  . BPH (benign prostatic hypertrophy) with urinary obstruction 07/03/2015    Priority: Medium  . Anemia 10/04/2011    Priority: Medium  . Depression 10/04/2011    Priority: Medium  . HTN (hypertension) 10/02/2011    Priority: Medium  . Hyperlipidemia 10/02/2011    Priority: Medium  . CVA (cerebral infarction) 10/02/2011    Priority: Low  . Traumatic hematuria 01/21/2016  . Neurogenic bladder, flaccid   . Complicated UTI (urinary tract infection) 08/20/2015  . Falls frequently 06/22/2015  . Chronic low back pain 11/24/2014      Medications  Current Outpatient Prescriptions:  .  aspirin 81 MG chewable tablet, Chew 81 mg by mouth daily., Disp: , Rfl:  .  atorvastatin (LIPITOR) 40 MG tablet, Take  40 mg by mouth at bedtime., Disp: , Rfl:  .  Calcium Citrate-Vitamin D (CALCIUM CITRATE + D PO), Take 1,000 mg by mouth daily. For low Ca levels, Disp: , Rfl:  .  cephALEXin (KEFLEX) 500 MG capsule, Take 1 capsule (500 mg total) by mouth 3 (three) times daily., Disp: 20 capsule, Rfl: 0 .  divalproex (DEPAKOTE ER) 250 MG 24 hr tablet, Take 1  tablet (250 mg total) by mouth 2 (two) times daily., Disp: , Rfl:  .  finasteride (PROSCAR) 5 MG tablet, Take 5 mg by mouth daily., Disp: , Rfl:  .  ibuprofen (ADVIL,MOTRIN) 200 MG tablet, Take 200 mg by mouth every 6 (six) hours as needed for pain. Reported on 08/20/2015, Disp: , Rfl:  .  Insulin Glargine (LANTUS SOLOSTAR) 100 UNIT/ML SOPN, Inject 35 Units into the skin daily at 10 pm. , Disp: , Rfl:  .  lisinopril (PRINIVIL,ZESTRIL) 5 MG tablet, Take 5 mg by mouth every morning., Disp: , Rfl:  .  Melatonin 3 MG TABS, Take 3 mg by mouth every evening., Disp: , Rfl:  .  Multiple Vitamin (MULTIVITAMIN WITH MINERALS) TABS, Take 1 tablet by mouth daily., Disp: , Rfl:  .  ONE TOUCH ULTRA TEST test strip, , Disp: , Rfl:  .  pantoprazole (PROTONIX) 20 MG tablet, Take 20 mg by mouth daily., Disp: , Rfl:  .  PARoxetine (PAXIL) 40 MG tablet, Take 1 tablet (40 mg total) by mouth at bedtime., Disp: , Rfl:  .  senna (SENOKOT) 8.6 MG TABS tablet, Take 2 tablets by mouth at bedtime., Disp: , Rfl:  .  tamsulosin (FLOMAX) 0.4 MG CAPS capsule, Take 0.8 mg by mouth daily., Disp: , Rfl:  .  venlafaxine XR (EFFEXOR-XR) 75 MG 24 hr capsule, Take 75 mg by mouth daily., Disp: , Rfl:  .  Wheat Dextrin (BENEFIBER PO), Take 7.5 mLs by mouth daily. Reported on 08/17/2015, Disp: , Rfl:    Vitals:   01/18/16 1014 01/19/16 1014 01/20/16 1015 01/21/16 1016  BP: 140/82 136/74 (!) 143/77 (!) 126/96  Temp:  98 F (36.7 C)    Weight:        Wt Readings from Last 3 Encounters:  01/10/16 183 lb (83 kg)  08/17/15 156 lb (70.8 kg)  08/12/15 157 lb (71.2 kg)     Review of Systems:   No fever No loss of appetite No nausea/vomiting No diarrhea No constipation  PHYSICAL EXAM:. General: No acute distress, well nourished, pleasant, seated in WC, penil Foley cath with thigh bag, cooperative, foll HEENT:  No scleral icterus, no nasal secretions, [place] moist CV:  RRR, no murmur, no ankle swelling RESP: No resp distress  or accessory muscle use.  Clear to ausc bilat. No wheezing, no rales, no rhonchi.  ABD:  Soft, Non-tender, non-distended, +bowel sounds, no masses MSK:  No back pain, no joint pain.  No joint swelling or redness Skin:  No significant skin lesions or rash thighs Neurologic:  Cranial nerves 2-12 grossly intact, normal tone in extremities, .Following two-step directions. Moving all extremities spontaneously and equally.   Psych:  Judgment and insight normal, Memory normal for long term and episodic memory for this morning, Mood and affect appropriate   Lab Urinalysis    Component Value Date/Time   COLORURINE YELLOW 01/20/2016 1716   APPEARANCEUR CLOUDY (A) 01/20/2016 1716   LABSPEC 1.012 01/20/2016 1716   PHURINE 6.0 01/20/2016 1716   GLUCOSEU 500 (A) 01/20/2016 1716   HGBUR LARGE (A) 01/20/2016  [place] 01/20/2016 [place] 01/20/2016 [place] 01/20/2016 1716   UROBILINOGEN 1.0 10/02/2011 1606   NITRITE NEGATIVE 01/20/2016 1716   LEUKOCYTESUR LARGE (A) 01/20/2016 1716   LEUKOCYTESUR 3+ 07/24/2015  Urine Culture (01/20/16) - In Process   Assessment and Plan:   1. Traumatic Hematuria 2.. Possible Complicated UTI (urinary tract infection) - History of E.coli UTI in March 2017 that was sensitive to Cephazolin      Code Status:     Code Status History    Date Active Date Inactive Code Status Order ID Comments User Context   06/16/2015  9:31 PM 06/18/2015 10:11 PM Full Code [id number]  Hosie Poisson, MD Inpatient   04/17/2015 10:27 AM 04/18/2015  3:25 AM Full Code [id number]  Logan Bores, MD HOV        Medications Discontinued During This Encounter  Medication Reason  . divalproex (DEPAKOTE ER) 500 MG 24 hr tablet Reorder  . PARoxetine (PAXIL) 40 MG tablet Reorder  . PARoxetine (PAXIL) 20 MG tablet Reorder

## 2016-01-21 NOTE — Assessment & Plan Note (Signed)
New problem Given increase risk of UTI from I&O catheterization for neurogenic bladder and neurogenic bladder itself, with change in behavior and cognition reported by family and that the dx of UTI was not refuted by urinalysis, acute complicated UTI dx seems plausible.  Further workup pending Await urine culture results.  If no growth, stop empiric keflex therapy If Positive urine culture, guide antibiotic therapy based on antibiogram.

## 2016-01-21 NOTE — Assessment & Plan Note (Signed)
New problem Further Workup planned Foley cath for 48hours then extubate bladder.  Monitor for recurrence of hematuria or difficulty with scheduled I&O catheterization  Empiric treatment for possible UTI with Keflex oral for 7 days.  Check CBC and BMET with hematuria / difficulty I&O cath  Order written that all future I&O catheterizations of bladder are to be performed with the patient in the supine position.

## 2016-01-23 LAB — URINE CULTURE: Culture: >=100000 — AB

## 2016-01-24 ENCOUNTER — Telehealth (HOSPITAL_COMMUNITY): Payer: Self-pay

## 2016-01-24 NOTE — Telephone Encounter (Signed)
Post ED Visit - Positive Culture Follow-up  Culture report reviewed by antimicrobial stewardship pharmacist:  []  Elenor Quinones, Pharm.D. []  Heide Guile, Pharm.D., BCPS []  Parks Neptune, Pharm.D. []  Alycia Rossetti, Pharm.D., BCPS []  [place], Pharm.D., BCPS, AAHIVP []  Legrand Como, Pharm.D., BCPS, AAHIVP []  Milus Glazier, Pharm.D. []  Stephens November, Pharm.DDyanne Carrel, Pharm.D.  Positive urine culture>/= 100,000 colonies -> Group B Strep Treated with Cephalexin, organism sensitive to the same and no further patient follow-up is required at this time.   Dortha Kern 01/24/2016, 12:22 PM

## 2016-02-06 ENCOUNTER — Non-Acute Institutional Stay (INDEPENDENT_AMBULATORY_CARE_PROVIDER_SITE_OTHER): Payer: Medicare Other | Admitting: Family Medicine

## 2016-02-06 DIAGNOSIS — Z593 Problems related to living in residential institution: Secondary | ICD-10-CM

## 2016-02-06 NOTE — Progress Notes (Signed)
Subjective:     Patient ID: SRIJAN BRUNGARDT, male   DOB: [birth year], 75 y.o.   MRN: [id number]  HPI Mr. Verhagen is a 75yo male currently residing in [facility]. Prior history of stroke, diabetes, hypertension, dementia, and CKD noted.  Diagnosed with dementia three years ago. Has been becoming more verbally abuse and has had several falls, making it difficult for his wife to care for. Wife was unable to help him up after falls. Needed assistance for bathing, dressing, and toileting. Eats independently.  Was completely dependent on caretaker for cooking, housework, managing medications, shopping, transportation, and finances.   Wife is health care power of attorney. Currently DNR/DNI.  Since last visit the geriatrics team has been trying to titrate Mr. Torsiello off of Depakote. Agitation occurred and family requested restarting on 02/06/16.   ED visit on 01/20/16 noted with diagnosis of UTI. Urine culture with >100,000 colonies of Group B Strep. Treated with Keflex.  Today reports most of his discomfort is in his back. Reports history of back surgery. Denies any need for pain medication currently. Denies fatigue. Notes some mild suprapubic abdominal pain.  Review of Systems Per HPI.    Objective:   Physical Exam Constitutional: He is oriented to person, place, and time. He appears well-developed and well-nourished. No distress.  HENT:  Head: Normocephalic and atraumatic.  Cardiovascular: Normal rate.  Exam reveals no gallop and no friction rub.   No murmur heard. Pulmonary/Chest: Effort normal. No respiratory distress. He has no wheezes.  Abdominal: Soft. Bowel sounds are normal. He exhibits no distension. Mild epigastric tenderness. Musculoskeletal: He exhibits no edema.  Neurological: He is alert and oriented to person, place, and time.  Skin: No rash noted.  Psychiatric: He has a normal mood and affect. His behavior is normal.     Assessment and Plan:     #  Hypertension: - Lisinopril 5mg  - Check blood pressures weekly  # Diabetes: - Last A1C 6.5 in 05/2015 - Metformin discontinued due to diarrhea, weight loss - Lantus 35units daily  # Depression/Anxiety: - Paxil 40mg . Effexor 75mg   # Hyperlipidemia: - Continue Atorvastatin  # Dementia: - Depakote 250mg  twice daily. S/p attempt to wean. - Melatonin 3mg  nightly to help with sleep - Trial of Donepezile discontinued due to weight loss. - Fall precautions  # Incontinence with h/o Mechanical Balanitis/BPH: - Proscar 5mg , Flomax 0.8mg  - Will reconsider need of above medications in 6 months.  # Constipation: - Senokot 2 tablets daily  # Hypocalcemia: - Calcium Citrate-Vitamin D  # Weight Loss: - Improved  - Monitor weight - Nutritionist consulted

## 2016-02-15 ENCOUNTER — Encounter: Payer: Self-pay | Admitting: Pharmacist

## 2016-02-17 ENCOUNTER — Telehealth: Payer: Self-pay | Admitting: Student

## 2016-02-17 NOTE — Telephone Encounter (Signed)
Called because patient has been " feeling unwell" today per nursing staff. He has been less interested in activities, kept saying he does not feel well. Vitals signs were normal and patient ws afebrile. CBG 328. He has not have nausea, emesis, abdominal pain, SOB or chest pain. Nursing staff advised to monitor closely and call if he develops any changes. Will need the geriatric resident to evaluate in the AM

## 2016-02-18 ENCOUNTER — Non-Acute Institutional Stay: Payer: Medicare Other | Admitting: Obstetrics and Gynecology

## 2016-02-18 DIAGNOSIS — N390 Urinary tract infection, site not specified: Secondary | ICD-10-CM

## 2016-02-18 NOTE — Telephone Encounter (Signed)
Dr Darcel Bayley has assessed patient.  Urine culture pending.

## 2016-02-18 NOTE — Progress Notes (Signed)
[facility] Acute Visit  Reginald Patterson is accompanied by patient and wife Sources of clinical information for visit is/are patient, spouse/SO and nursing home.  HISTORY OF PRESENT ILLNESS: Reginald Patterson is a 75 y.o.  male.   Chief Complaint  Patient presents with  . Fatigue    HPI: Per patient's wife patient started acting different on Sunday She states that on Saturday when she took the patient home for a few hours he was his normal self. However when she came to visit on Sunday she noticed patietn was extremely fatigued; "wanting to sleep all day" and confused. She states this is how patient acts when he has a UTI.  Per nursing, patient has had normal vitals. He gets an I&O cath 5x daily. Nurses noted that he has had increased sedement in his urine. Over the weekend   History Patient Active Problem List   Diagnosis Date Noted  . Traumatic hematuria 01/21/2016  . Neurogenic bladder, flaccid   . Complicated UTI (urinary tract infection) 08/20/2015  . Colon polyps   . 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 Falls frequently 06/22/2015  . Chronic low back pain 11/24/2014  . Anemia 10/04/2011  . Depression 10/04/2011  . CVA (cerebral infarction) 10/02/2011  . HTN (hypertension) 10/02/2011  . Hyperlipidemia 10/02/2011  . Diabetes mellitus, type 2 ([place]) 10/02/2011     Medications  Current Outpatient Prescriptions:  .  aspirin 81 MG chewable tablet, Chew 81 mg by mouth daily., Disp: , Rfl:  .  atorvastatin (LIPITOR) 40 MG tablet, Take 40 mg by mouth at bedtime., Disp: , Rfl:  .  Calcium Citrate-Vitamin D (CALCIUM CITRATE + D PO), Take 1,000 mg by mouth daily. For low Ca levels, Disp: , Rfl:  .  divalproex (DEPAKOTE ER) 250 MG 24 hr tablet, Take 1 tablet (250 mg total) by mouth 2 (two) times daily., Disp: , Rfl:  .  finasteride (PROSCAR) 5 MG tablet, Take 5 mg by mouth  daily., Disp: , Rfl:  .  ibuprofen (ADVIL,MOTRIN) 200 MG tablet, Take 200 mg by mouth every 6 (six) hours as needed for pain. Reported on 08/20/2015, Disp: , Rfl:  .  Insulin Glargine (LANTUS SOLOSTAR) 100 UNIT/ML SOPN, Inject 35 Units into the skin daily at 10 pm. , Disp: , Rfl:  .  lisinopril (PRINIVIL,ZESTRIL) 5 MG tablet, Take 5 mg by mouth every morning., Disp: , Rfl:  .  Melatonin 3 MG TABS, Take 3 mg by mouth every evening., Disp: , Rfl:  .  Multiple Vitamin (MULTIVITAMIN WITH MINERALS) TABS, Take 1 tablet by mouth daily., Disp: , Rfl:  .  pantoprazole (PROTONIX) 20 MG tablet, Take 20 mg by mouth daily., Disp: , Rfl:  .  PARoxetine (PAXIL) 40 MG tablet, Take 1 tablet (40 mg total) by mouth at bedtime., Disp: , Rfl:  .  senna (SENOKOT) 8.6 MG TABS tablet, Take 2 tablets by mouth at bedtime., Disp: , Rfl:  .  tamsulosin (FLOMAX) 0.4 MG CAPS capsule, Take 0.8 mg by mouth daily., Disp: , Rfl:  .  venlafaxine XR (EFFEXOR-XR) 75 MG 24 hr capsule, Take 75 mg by mouth daily., Disp: , Rfl:  .  Wheat Dextrin (BENEFIBER PO), Take 7.5 mLs by mouth daily. Reported on 08/17/2015, Disp: , Rfl:    There were no vitals filed for this visit.  Wt Readings from Last 3 Encounters:  01/10/16 183 lb (83 kg)  08/17/15  156 lb (70.8 kg)  08/12/15 157 lb (71.2 kg)     Review of Systems:   No fever No loss of appetite No nausea/vomiting No diarrhea No constipation No abdominal pain  PHYSICAL EXAM:. General: No acute distress, well nourished, pleasant, lying in bed, cooperative HEENT:  No scleral icterus, no nasal secretions, [place] moist CV:  RRR, no murmur, no ankle swelling RESP: No resp distress or accessory muscle use.  Clear to ausc bilat. No wheezing, no rales, no rhonchi.  ABD:  Soft, Non-tender, non-distended, +bowel sounds, no masses MSK:  No back pain, no joint pain.  No joint swelling or redness Skin:  No significant skin lesions or rash thighs Neurologic:  Cranial nerves 2-12 grossly  intact, normal tone in extremities, .Following two-step directions. Moving all extremities spontaneously and equally.  A&Ox2 Psych:  Judgment and insight normal, Memory normal for long term and episodic memory for this morning, Mood and affect appropriate   Assessment(s):    Concern for recurrent UTI. History of GPC UTI about 3 weeks ago and was treated with Keflex. H/o recurrent UTIs in setting of frequent I/O catherization.   Plan(s): 1. Obtain UA, UCX, CBC, BMP  2.  Do not start antibiotics empirically due to patient being stable and afebrile. If UTI discovered based of [place] criteria will treat.  3.  Monitor vitals  Code Status:     Code Status History    Date Active Date Inactive Code Status Order ID Comments User Context   06/16/2015  9:31 PM 06/18/2015 10:11 PM Full Code [id number]  Hosie Poisson, MD Inpatient   04/17/2015 10:27 AM 04/18/2015  3:25 AM Full Code [id number]  Logan Bores, MD [place], DO PGY-3, [place]

## 2016-03-03 NOTE — Progress Notes (Signed)
Subjective:     Patient ID: Reginald Patterson, male   DOB: Jun 07, 1940, 75 y.o.   MRN: [id number]  HPI Reginald Patterson is a 75yo male currently residing in [facility]. Prior history of stroke, diabetes, hypertension, dementia, and CKD noted.  Diagnosed with dementia three years ago. Has been becoming more verbally abuse and has had several falls, making it difficult for his wife to care for. Wife was unable to help him up after falls. Needed assistance for bathing, dressing, and toileting. Eats independently.  Was completely dependent on caretaker for cooking, housework, managing medications, shopping, transportation, and finances.   Wife is health care power of attorney. Currently DNR/DNI.  Since last visit the geriatrics team has been trying to titrate Reginald Patterson off of Depakote. Agitation occurred and family requested restarting on 02/06/16.   ED visit on 01/20/16 noted with diagnosis of UTI. Urine culture with >100,000 colonies of Group B Strep. Treated with Keflex.  Today reports most of his discomfort is in his back. Reports history of back surgery. Denies any need for pain medication currently. Denies fatigue. Notes some mild suprapubic abdominal pain.  Review of Systems Per HPI.    Objective:   Physical Exam Constitutional: He is oriented to person, place, and time. He appears well-developed and well-nourished. No distress.  HENT:  Head: Normocephalic and atraumatic.  Cardiovascular: Normal rate.  Exam reveals no gallop and no friction rub.   No murmur heard. Pulmonary/Chest: Effort normal. No respiratory distress. He has no wheezes.  Abdominal: Soft. Bowel sounds are normal. He exhibits no distension. Mild epigastric tenderness. Musculoskeletal: He exhibits no edema.  Neurological: He is alert and oriented to person, place, and time.  Skin: No rash noted.  Psychiatric: He has a normal mood and affect. His behavior is normal.     Assessment and Plan:     #  Hypertension: - Lisinopril 5mg  - Check blood pressures weekly  # Diabetes: - Last A1C 6.5 in 05/2015 - Metformin discontinued due to diarrhea, weight loss - Lantus 35units daily  # Depression/Anxiety: - Paxil 40mg . Effexor 75mg   # Hyperlipidemia: - Continue Atorvastatin  # Dementia: - Depakote 250mg  twice daily. S/p attempt to wean. - Melatonin 3mg  nightly to help with sleep - Trial of Donepezile discontinued due to weight loss. - Fall precautions  # Incontinence with h/o Mechanical Balanitis/BPH: - Proscar 5mg , Flomax 0.8mg  - Will reconsider need of above medications in 6 months.  # Constipation: - Senokot 2 tablets daily  # Hypocalcemia: - Calcium Citrate-Vitamin D  # Weight Loss: - Improved  - Monitor weight - Nutritionist consulted    I have interviewed and examined the patient.  I have discussed the case and verified the key findings with Dr. Gerlean Ren.   I agree with their assessments and plans as documented in their visit note.  Depakote restarted at 250 mg twice a day as aggressive behavioral issue recurred.

## 2016-03-19 ENCOUNTER — Encounter: Payer: Self-pay | Admitting: Pharmacist

## 2016-03-22 ENCOUNTER — Telehealth: Payer: Self-pay | Admitting: Internal Medicine

## 2016-03-22 ENCOUNTER — Encounter (HOSPITAL_COMMUNITY): Payer: Self-pay | Admitting: Emergency Medicine

## 2016-03-22 ENCOUNTER — Emergency Department (HOSPITAL_COMMUNITY): Payer: Medicare Other

## 2016-03-22 ENCOUNTER — Emergency Department (HOSPITAL_COMMUNITY)
Admission: EM | Admit: 2016-03-22 | Discharge: 2016-03-23 | Disposition: A | Discharge status: Home / Self Care | Payer: Medicare Other | Attending: Emergency Medicine | Admitting: Emergency Medicine

## 2016-03-22 DIAGNOSIS — Z794 Long term (current) use of insulin: Secondary | ICD-10-CM | POA: Insufficient documentation

## 2016-03-22 DIAGNOSIS — N189 Chronic kidney disease, unspecified: Secondary | ICD-10-CM | POA: Insufficient documentation

## 2016-03-22 DIAGNOSIS — R319 Hematuria, unspecified: Secondary | ICD-10-CM | POA: Insufficient documentation

## 2016-03-22 DIAGNOSIS — Z7982 Long term (current) use of aspirin: Secondary | ICD-10-CM | POA: Insufficient documentation

## 2016-03-22 DIAGNOSIS — F0390 Unspecified dementia without behavioral disturbance: Secondary | ICD-10-CM | POA: Diagnosis not present

## 2016-03-22 DIAGNOSIS — Z8673 Personal history of transient ischemic attack (TIA), and cerebral infarction without residual deficits: Secondary | ICD-10-CM | POA: Diagnosis not present

## 2016-03-22 DIAGNOSIS — N390 Urinary tract infection, site not specified: Secondary | ICD-10-CM | POA: Diagnosis not present

## 2016-03-22 DIAGNOSIS — R531 Weakness: Secondary | ICD-10-CM | POA: Diagnosis present

## 2016-03-22 DIAGNOSIS — I129 Hypertensive chronic kidney disease with stage 1 through stage 4 chronic kidney disease, or unspecified chronic kidney disease: Secondary | ICD-10-CM | POA: Diagnosis not present

## 2016-03-22 DIAGNOSIS — E1122 Type 2 diabetes mellitus with diabetic chronic kidney disease: Secondary | ICD-10-CM | POA: Insufficient documentation

## 2016-03-22 DIAGNOSIS — Z87891 Personal history of nicotine dependence: Secondary | ICD-10-CM | POA: Insufficient documentation

## 2016-03-22 LAB — URINALYSIS, ROUTINE W REFLEX MICROSCOPIC
BILIRUBIN URINE: NEGATIVE
Ketones, ur: NEGATIVE mg/dL
Nitrite: NEGATIVE
PROTEIN: 30 mg/dL — AB
Specific Gravity, Urine: 1.016 (ref 1.005–1.030)
pH: 6.5 (ref 5.0–8.0)

## 2016-03-22 LAB — COMPREHENSIVE METABOLIC PANEL
ALBUMIN: 3.1 g/dL — AB (ref 3.5–5.0)
ALK PHOS: 80 U/L (ref 38–126)
ALT: 14 U/L — AB (ref 17–63)
AST: 14 U/L — AB (ref 15–41)
Anion gap: 7 (ref 5–15)
BUN: 13 mg/dL (ref 6–20)
CALCIUM: 9 mg/dL (ref 8.9–10.3)
CO2: 25 mmol/L (ref 22–32)
CREATININE: 1.12 mg/dL (ref 0.61–1.24)
Chloride: 102 mmol/L (ref 101–111)
GFR calc non Af Amer: >60 mL/min (ref >60)
GLUCOSE: 237 mg/dL — AB (ref 65–99)
Potassium: 4 mmol/L (ref 3.5–5.1)
SODIUM: 134 mmol/L — AB (ref 135–145)
Total Bilirubin: 0.5 mg/dL (ref 0.3–1.2)
Total Protein: 6.5 g/dL (ref 6.5–8.1)

## 2016-03-22 LAB — URINE MICROSCOPIC-ADD ON

## 2016-03-22 LAB — LIPASE, BLOOD: Lipase: 25 U/L (ref 11–51)

## 2016-03-22 LAB — CBC
HCT: 35.9 % — ABNORMAL LOW (ref 39.0–52.0)
Hemoglobin: 11.8 g/dL — ABNORMAL LOW (ref 13.0–17.0)
MCH: 27.7 pg (ref 26.0–34.0)
MCHC: 32.9 g/dL (ref 30.0–36.0)
MCV: 84.3 fL (ref 78.0–100.0)
PLATELETS: 345 10*3/uL (ref 150–400)
RBC: 4.26 MIL/uL (ref 4.22–5.81)
RDW: 13.5 % (ref 11.5–15.5)
WBC: 11.9 10*3/uL — ABNORMAL HIGH (ref 4.0–10.5)

## 2016-03-22 LAB — I-STAT CG4 LACTIC ACID, ED: LACTIC ACID, VENOUS: 1.16 mmol/L (ref 0.5–1.9)

## 2016-03-22 MED ORDER — CEPHALEXIN 250 MG PO CAPS
500.0000 mg | ORAL_CAPSULE | Freq: Once | ORAL | Status: AC
  Administered 2016-03-22: 500 mg via ORAL
  Filled 2016-03-22: qty 2

## 2016-03-22 MED ORDER — ACETAMINOPHEN 500 MG PO TABS
1000.0000 mg | ORAL_TABLET | Freq: Once | ORAL | Status: AC
  Administered 2016-03-22: 1000 mg via ORAL
  Filled 2016-03-22: qty 2

## 2016-03-22 MED ORDER — CEPHALEXIN 500 MG PO CAPS: 500.0000 mg | ORAL_CAPSULE | Freq: Three times a day (TID) | ORAL | 0 refills | Status: AC

## 2016-03-22 NOTE — ED Notes (Signed)
Report given to [place] [facility]

## 2016-03-22 NOTE — ED Notes (Signed)
Patient transported to CT via stretcher.

## 2016-03-22 NOTE — ED Provider Notes (Signed)
[facility] DEPT Provider Note   CSN: [id number] Arrival date & time: 03/22/16  2040     History   Chief Complaint Chief Complaint  Patient presents with  . Weakness    HPI Reginald Patterson is a 75 y.o. male.  Weakness  Primary symptoms include loss of balance. Primary symptoms comment: Generalized weakness. This is a new problem. The current episode started 12 to 24 hours ago. The problem has been gradually worsening. There was no focality noted. The maximum temperature recorded prior to his arrival was 101 to 101.9 F (Measured here). The fever has been present for less than 1 day. Associated symptoms include altered mental status (Slowing from baseline dementia state). There were no medications administered prior to arrival. Associated medical issues include dementia. Associated medical issues comments: Intermittent self-catheterization for urinary retention and frequent UTIs.    Past Medical History:  Diagnosis Date  . Acute encephalopathy 06/16/2015  . Agitation 02/20/2015  . Anemia 11-08-12   mild anemia  . Anxiety   . Anxiety state 06/22/2015  . Chronic kidney disease    must self cath  . Colon polyps   . CVA (cerebral infarction) 10/02/2011  . Dementia 06/22/2015  . Depression 10/04/2011  . Diabetes mellitus, type 2 ([place]) 10/02/2011  . Encephalopathy acute 06/17/2015  . Falls frequently 06/22/2015  . Fecal incontinence   . Fracture of rib of right side 06/15/2015   8th right posterior rib fracture   . HTN (hypertension) 10/02/2011  . Hyperlipidemia   . Hypertension   . Incontinence 06/22/2015  . Infection of urinary tract 07/25/2015  . Leg weakness 02/20/2015  . 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 Stroke [facility]) 11-08-12   x2 , last 09-29-11(confusion,left side weakness) no residual"some memory problems.  [facility] UTI (lower urinary tract infection) 06/16/15; 07/25/14  . Weight loss, unintentional 07/25/2015    Patient Active Problem List   Diagnosis Date Noted  . Neurogenic  bladder, flaccid   . Complicated UTI (urinary tract infection) 08/20/2015  . Colon polyps   . 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 Falls frequently 06/22/2015  . Chronic low back pain 11/24/2014  . Anemia 10/04/2011  . Depression 10/04/2011  . CVA (cerebral infarction) 10/02/2011  . HTN (hypertension) 10/02/2011  . Hyperlipidemia 10/02/2011  . Diabetes mellitus, type 2 ([place]) 10/02/2011    Past Surgical History:  Procedure Laterality Date  . [place]- metal plate and screws for degenerative changes spine  . COLONOSCOPY WITH PROPOFOL N/A 01/12/2013   Procedure: COLONOSCOPY WITH PROPOFOL;  Surgeon: Arta Silence, MD;  Location: [facility] END[facility]COPY;  Service: Endoscopy;  Laterality: N/A;       Home Medications    Prior to Admission medications   Medication Sig Start Date End Date Taking? Authorizing Provider  aspirin 81 MG chewable tablet Chew 81 mg by mouth daily.    Historical Provider, MD  atorvastatin (LIPITOR) 40 MG tablet Take 40 mg by mouth at bedtime. 07/19/15   Historical Provider, MD  Calcium Citrate-Vitamin D (CALCIUM CITRATE + D PO) Take 1,000 mg by mouth daily. For low Ca levels    Historical Provider, MD  divalproex (DEPAKOTE ER) 250 MG 24 hr tablet Take 1 tablet (250 mg total) by mouth 2 (two) times daily. 01/21/16   Blane Ohara McDiarmid, MD  finasteride (PROSCAR) 5 MG tablet Take 5 mg by mouth daily.    Historical Provider, MD  ibuprofen (ADVIL,MOTRIN) 200 MG tablet Take 200 mg by mouth every 6 (six) hours as needed for pain. Reported on 08/20/2015    Historical Provider, MD  Insulin Glargine (LANTUS SOLOSTAR) 100 UNIT/ML SOPN Inject 35 Units into the skin daily at 10 pm.     Historical Provider, MD  lisinopril (PRINIVIL,ZESTRIL) 5 MG tablet Take 5 mg by mouth every morning.    Historical Provider, MD  Melatonin 3 MG TABS Take 3 mg by mouth every evening.     Historical Provider, MD  Multiple Vitamin (MULTIVITAMIN WITH MINERALS) TABS Take 1 tablet by mouth daily.    Historical Provider, MD  Olopatadine HCl 0.2 % SOLN Place 1 drop into both eyes daily.    Historical Provider, MD  pantoprazole (PROTONIX) 20 MG tablet Take 20 mg by mouth daily.    Historical Provider, MD  PARoxetine (PAXIL) 40 MG tablet Take 1 tablet (40 mg total) by mouth at bedtime. 01/21/16   Blane Ohara McDiarmid, MD  senna (SENOKOT) 8.6 MG TABS tablet Take 2 tablets by mouth at bedtime.    Historical Provider, MD  tamsulosin (FLOMAX) 0.4 MG CAPS capsule Take 0.8 mg by mouth daily.    Historical Provider, MD  venlafaxine XR (EFFEXOR-XR) 75 MG 24 hr capsule Take 75 mg by mouth daily. 08/16/15   Historical Provider, MD  Wheat Dextrin (BENEFIBER PO) Take 7.5 mLs by mouth daily. Reported on 08/17/2015    Historical Provider, MD    Family History Family History  Problem Relation Age of Onset  . Dementia Mother   . Alzheimer's disease Mother   . Stroke Mother   . Lung cancer Father   . Colon cancer Maternal Grandmother     Social History Social History  Substance Use Topics  . Smoking status: Former Smoker    Packs/day: 1.00    Years: 5.00    Types: Cigarettes  . Smokeless tobacco: Former Systems developer    Types: Chew    Quit date: 05/26/1962  . Alcohol use Yes     Comment: only rarely     Allergies   Ciprofloxacin and Vicodin [hydrocodone-acetaminophen]   Review of Systems Review of Systems  Neurological: Positive for weakness and loss of balance.  All other systems reviewed and are negative.    Physical Exam Updated Vital Signs BP 147/76   Pulse 86   Temp (S) 101 F (38.3 C) (Rectal)   Resp 19   Ht 5\' 7"  (1.702 m)   Wt 182 lb (82.6 kg)   SpO2 96%   BMI 28.51 kg/m   Physical Exam  Constitutional: He appears well-developed and well-nourished. No distress.  HENT:  Head: Normocephalic and atraumatic.  Nose: Nose normal.  Eyes: Conjunctivae are normal.  Neck: Neck  supple. No tracheal deviation present.  Cardiovascular: Normal rate and regular rhythm.   Pulmonary/Chest: Effort normal. No respiratory distress.  Abdominal: Soft. He exhibits no distension.  Neurological: He is alert. He is disoriented (At baseline with some slowing per family who is able to provide a history).  Skin: Skin is warm and dry.  Psychiatric: He has a normal mood and affect.     ED Treatments / Results  Labs (all labs ordered are listed, but only abnormal results are displayed) Labs Reviewed  COMPREHENSIVE METABOLIC PANEL - Abnormal; Notable for the following:       Result Value   Sodium 134 (*)    Glucose, Bld 237 (*)    Albumin 3.1 (*)    AST 14 (*)  ALT 14 (*)    All other components within normal limits  CBC - Abnormal; Notable for the following:    WBC 11.9 (*)    Hemoglobin 11.8 (*)    HCT 35.9 (*)    All other components within normal limits  URINALYSIS, ROUTINE W REFLEX MICROSCOPIC (NOT AT [facility]) - Abnormal; Notable for the following:    APPearance TURBID (*)    Glucose, UA >1000 (*)    Hgb urine dipstick MODERATE (*)    Protein, ur 30 (*)    Leukocytes, UA LARGE (*)    All other components within normal limits  URINE MICROSCOPIC-ADD ON - Abnormal; Notable for the following:    Squamous Epithelial / LPF 0-5 (*)    Bacteria, UA MANY (*)    All other components within normal limits  URINE CULTURE  LIPASE, BLOOD  I-STAT CG4 LACTIC ACID, ED    EKG  EKG Interpretation None       Radiology Ct Head Wo Contrast  Result Date: 03/22/2016 CLINICAL DATA:  75 year old male with weakness and confusion. History of prior stroke. EXAM: CT HEAD WITHOUT CONTRAST TECHNIQUE: Contiguous axial images were obtained from the base of the skull through the vertex without intravenous contrast. COMPARISON:  Head CT dated 06/16/2015 FINDINGS: Brain: There is moderate age-related atrophy and chronic microvascular ischemic changes. There is no acute intracranial  hemorrhage. No mass effect or midline shift noted. No extra-axial fluid collection. Vascular: No hyperdense vessel or unexpected calcification. Skull: Normal. Negative for fracture or focal lesion. Sinuses/Orbits: No acute finding. Other: A 1 cm nodular density arising from the scalp over the vertex similar to prior CT. IMPRESSION: No acute intracranial hemorrhage. Age-related atrophy and chronic microvascular ischemic disease. If symptoms persist and there are no contraindications, MRI may provide better evaluation if clinically indicated. Electronically Signed   By: Anner Crete M.D.   On: 03/22/2016 22:45    Procedures Procedures (including critical care time)  Medications Ordered in ED Medications - No data to display   Initial Impression / Assessment and Plan / ED Course  I have reviewed the triage vital signs and the nursing notes.  Pertinent labs & imaging results that were available during my care of the patient were reviewed by me and considered in my medical decision making (see chart for details).  Clinical Course    75 y.o. male presents with Ongoing weakness and confusion from his baseline state of dementia after being picked up from the nursing facility and spending the day with his family. Family states that this is consistent with prior UTI related to intermittent self-catheterization. It appears multiple cultures previously were sensitive to most antibiotics. Patient does have a history of multiple CVAs so CT head was ordered but showed no acute abnormality. Heavy pyuria and fever are more consistent with urinary tract infection. The patient does not appear septic currently and has only mild white blood cell count elevation so we will trial with outpatient oral antibiotics and return precautions were discussed for persistent fevers, worsening or new concerning symptoms.  Final Clinical Impressions(s) / ED Diagnoses   Final diagnoses:  Urinary tract infection with  hematuria, site unspecified    New Prescriptions New Prescriptions   CEPHALEXIN (KEFLEX) 500 MG CAPSULE    Take 1 capsule (500 mg total) by mouth 3 (three) times daily.     Leo Grosser, MD 03/22/16 2251

## 2016-03-22 NOTE — ED Triage Notes (Signed)
Pt presents to ER from [facility] for lower abd pain, recurrent UTI, and possible constipation; pt has hx of CVAs x 3 and dementia, family at bedside are historians; EMS reports stable VS enroute to hospital; pt feels warm to touch with multiple layers on; pt voids by intermittent cath 5x per day; family reports pt demonstrated decrease in functioning and sleeping a lot today which is how he typically presents for UTI

## 2016-03-22 NOTE — ED Notes (Signed)
Layers of clothing and blankets removed at this time

## 2016-03-22 NOTE — Telephone Encounter (Signed)
[facility] After Hours Telephone Line   Number received via page: [phone] Person calling: Nurse  Reason for call: Fever/Confusion  Patient was confused and warm touch therefore family member check temperature while out with family, reported to have fever 101.8 by family. Patient did not receive any medication. Fever at the nursing home was 100.8. Per nursing staff patient seemed confused. No nausea or vomiting. Patient endorsed pain with urination, and patient urine did appear cloudy according to staff. Patient did not eat anything for dinner tonight and had very little to drink. Patient is in/out cathed 5 times a week, and has had previous hx of urinary tract infections, last infection was on August 28th, received 14 days of keflex.   Vitals  BP 141/76 Pulse 84 RR: 20  Temp: 100.8 Pulse ox: 92  Due to concerns of systemic association with infection including confusion, reported temp of 101.8 and decreased intake. Will send patient to ED for evaluation.  Kerrin Mo PGY-2 [place]

## 2016-03-23 NOTE — ED Notes (Signed)
[facility] here to transport pt back to [facility] at this time

## 2016-03-24 ENCOUNTER — Non-Acute Institutional Stay (INDEPENDENT_AMBULATORY_CARE_PROVIDER_SITE_OTHER): Payer: Medicare Other | Admitting: Family Medicine

## 2016-03-24 DIAGNOSIS — N390 Urinary tract infection, site not specified: Secondary | ICD-10-CM

## 2016-03-24 NOTE — Progress Notes (Signed)
[facility]  Subjective:  Reginald Patterson is a 75 y.o. male being seen for ED follow up for UTI and confusion.   HPI:  UTI/Confusion Patient was found to be more confused that normal 2 days ago along with a fever, decreased appetite, and dysuria. He was evaluated in the ED and had a UA concerning for UTI and was discharged home with a course of keflex. Since then, the culture has grown klebsiella species, but has sensitivities have not returned. Since being on keflex, patient is doing much better with his confusion. Patient also states that he is feeling stronger than he did last week. No further fevers. No dysuria. No abdominal pain, nausea, or vomiting.   ROS: Per HPI  PMH:  The following were reviewed and entered/updated in epic: Past Medical History:  Diagnosis Date  . Acute encephalopathy 06/16/2015  . Agitation 02/20/2015  . Anemia 11-08-12   mild anemia  . Anxiety   . Anxiety state 06/22/2015  . Chronic kidney disease    must self cath  . Colon polyps   . CVA (cerebral infarction) 10/02/2011  . Dementia 06/22/2015  . Depression 10/04/2011  . Diabetes mellitus, type 2 ([place]) 10/02/2011  . Encephalopathy acute 06/17/2015  . Falls frequently 06/22/2015  . Fecal incontinence   . Fracture of rib of right side 06/15/2015   8th right posterior rib fracture   . HTN (hypertension) 10/02/2011  . Hyperlipidemia   . Hypertension   . Incontinence 06/22/2015  . Infection of urinary tract 07/25/2015  . Leg weakness 02/20/2015  . 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 Stroke [facility]) 11-08-12   x2 , last 09-29-11(confusion,left side weakness) no residual"some memory problems.  Marland Kitchen UTI (lower urinary tract infection) 06/16/15; 07/25/14  . Weight loss, unintentional 07/25/2015   Patient Active Problem List   Diagnosis Date Noted  . Neurogenic bladder, flaccid   . Complicated UTI (urinary tract infection) 08/20/2015  . Colon polyps   . BPH (benign prostatic hypertrophy) with urinary obstruction  07/03/2015  . 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 Falls frequently 06/22/2015  . Chronic low back pain 11/24/2014  . Anemia 10/04/2011  . Depression 10/04/2011  . CVA (cerebral infarction) 10/02/2011  . HTN (hypertension) 10/02/2011  . Hyperlipidemia 10/02/2011  . Diabetes mellitus, type 2 ([place]) 10/02/2011   Past Surgical History:  Procedure Laterality Date  . [place]- metal plate and screws for degenerative changes spine  . COLONOSCOPY WITH PROPOFOL N/A 01/12/2013   Procedure: COLONOSCOPY WITH PROPOFOL;  Surgeon: Arta Silence, MD;  Location: [facility];  Service: Endoscopy;  Laterality: N/A;    Family History  Problem Relation Age of Onset  . Dementia Mother   . Alzheimer's disease Mother   . Stroke Mother   . Lung cancer Father   . Colon cancer Maternal Grandmother     Medications- reviewed and updated Current Outpatient Prescriptions  Medication Sig Dispense Refill  . aspirin 81 MG chewable tablet Chew 81 mg by mouth daily.    Marland Kitchen atorvastatin (LIPITOR) 40 MG tablet Take 40 mg by mouth at bedtime.    . Calcium Citrate-Vitamin D (CALCIUM CITRATE + D PO) Take 1,000 mg by mouth daily. For low Ca levels    . cephALEXin (KEFLEX) 500 MG capsule Take 1 capsule (500 mg total) by mouth 3 (three) times daily. 30 capsule 0  . divalproex (DEPAKOTE ER) 250 MG 24 hr tablet Take 1 tablet (250 mg total)  by mouth 2 (two) times daily.    . finasteride (PROSCAR) 5 MG tablet Take 5 mg by mouth daily.    Marland Kitchen ibuprofen (ADVIL,MOTRIN) 200 MG tablet Take 200 mg by mouth every 6 (six) hours as needed for pain. Reported on 08/20/2015    . Insulin Glargine (LANTUS SOLOSTAR) 100 UNIT/ML SOPN Inject 35 Units into the skin daily at 10 pm.     . lisinopril (PRINIVIL,ZESTRIL) 5 MG tablet Take 5 mg by mouth every morning.    . Melatonin 3 MG TABS Take 3 mg by mouth every evening.    . Multiple Vitamin (MULTIVITAMIN WITH MINERALS) TABS Take 1 tablet by mouth daily.     . Olopatadine HCl 0.2 % SOLN Place 1 drop into both eyes daily.    . pantoprazole (PROTONIX) 20 MG tablet Take 20 mg by mouth daily.    Marland Kitchen PARoxetine (PAXIL) 40 MG tablet Take 1 tablet (40 mg total) by mouth at bedtime.    . senna (SENOKOT) 8.6 MG TABS tablet Take 2 tablets by mouth at bedtime.    . tamsulosin (FLOMAX) 0.4 MG CAPS capsule Take 0.8 mg by mouth daily.    Marland Kitchen venlafaxine XR (EFFEXOR-XR) 75 MG 24 hr capsule Take 75 mg by mouth daily.    . Wheat Dextrin (BENEFIBER PO) Take 7.5 mLs by mouth daily. Reported on 08/17/2015     No current facility-administered medications for this visit.     Allergies-reviewed and updated Allergies  Allergen Reactions  . Ciprofloxacin Rash  . Vicodin [Hydrocodone-Acetaminophen] Rash    Social History   Social History  . Marital status: Married    Spouse name: [name]  . Number of children: 3  . Years of education: HS   Occupational History  .  Retired   Social History Main Topics  . Smoking status: Former Smoker    Packs/day: 1.00    Years: 5.00    Types: Cigarettes  . Smokeless tobacco: Former Systems developer    Types: Chew    Quit date: 05/26/1962  . Alcohol use Yes     Comment: only rarely  . Drug use: No  . Sexual activity: Not Currently   Other Topics Concern  . Not on file   Social History Narrative          Objective:  Physical Exam: BP 125/70   Pulse 84   Resp 20   Gen: NAD, lying in bed resting comfortably CV: RRR with no murmurs appreciated Pulm: NWOB, CTAB with no crackles, wheezes, or rhonchi GI: Normal bowel sounds present. Soft, Nontender, Nondistended. Skin: warm, dry, no obvious lesions.  Neuro: grossly normal, moves all extremities Psych: Alert and conversational. Normal thought content.   Results for orders placed or performed during the hospital encounter of 03/22/16 (from the past 72 hour(s))  Lipase, blood     Status: None   Collection Time: 03/22/16  9:06 PM  Result Value Ref Range   Lipase 25 11 -  51 U/L  Comprehensive metabolic panel     Status: Abnormal   Collection Time: 03/22/16  9:06 PM  Result Value Ref Range   Sodium 134 (L) 135 - 145 mmol/L   Potassium 4.0 3.5 - 5.1 mmol/L   Chloride 102 101 - 111 mmol/L   CO2 25 22 - 32 mmol/L   Glucose, Bld 237 (H) 65 - 99 mg/dL   BUN 13 6 - 20 mg/dL   Creatinine, Ser 1.12 0.61 - 1.24 mg/dL   Calcium 9.0 8.9 -  10.3 mg/dL   Total Protein 6.5 6.5 - 8.1 g/dL   Albumin 3.1 (L) 3.5 - 5.0 g/dL   AST 14 (L) 15 - 41 U/L   ALT 14 (L) 17 - 63 U/L   Alkaline Phosphatase 80 38 - 126 U/L   Total Bilirubin 0.5 0.3 - 1.2 mg/dL   GFR calc non Af Amer >60 >60 mL/min   GFR calc Af Amer >60 >60 mL/min    Comment: (NOTE) The eGFR has been calculated using the CKD EPI equation. This calculation has not been validated in all clinical situations. eGFR's persistently <60 mL/min signify possible Chronic Kidney Disease.    Anion gap 7 5 - 15  CBC     Status: Abnormal   Collection Time: 03/22/16  9:06 PM  Result Value Ref Range   WBC 11.9 (H) 4.0 - 10.5 K/uL   RBC 4.26 4.22 - 5.81 MIL/uL   Hemoglobin 11.8 (L) 13.0 - 17.0 g/dL   HCT 35.9 (L) 39.0 - 52.0 %   MCV 84.3 78.0 - 100.0 fL   MCH 27.7 26.0 - 34.0 pg   MCHC 32.9 30.0 - 36.0 g/dL   RDW 13.5 11.5 - 15.5 %   Platelets 345 150 - 400 K/uL  Urinalysis, Routine w reflex microscopic     Status: Abnormal   Collection Time: 03/22/16  9:25 PM  Result Value Ref Range   Color, Urine YELLOW YELLOW   APPearance TURBID (A) CLEAR   Specific Gravity, Urine 1.016 1.005 - 1.030   pH 6.5 5.0 - 8.0   Glucose, UA >1000 (A) NEGATIVE mg/dL   Hgb urine dipstick MODERATE (A) NEGATIVE   Bilirubin Urine NEGATIVE NEGATIVE   Ketones, ur NEGATIVE NEGATIVE mg/dL   Protein, ur 30 (A) NEGATIVE mg/dL   Nitrite NEGATIVE NEGATIVE   Leukocytes, UA LARGE (A) NEGATIVE  Urine culture     Status: Abnormal (Preliminary result)   Collection Time: 03/22/16  9:25 PM  Result Value Ref Range   Specimen Description URINE,  CATHETERIZED    Special Requests NONE    Culture >=100,000 COLONIES/mL KLEBSIELLA PNEUMONIAE (A)    Report Status PENDING   Urine microscopic-add on     Status: Abnormal   Collection Time: 03/22/16  9:25 PM  Result Value Ref Range   Squamous Epithelial / LPF 0-5 (A) NONE SEEN   WBC, UA TOO NUMEROUS TO COUNT 0 - 5 WBC/hpf   RBC / HPF 6-30 0 - 5 RBC/hpf   Bacteria, UA MANY (A) NONE SEEN  I-Stat CG4 Lactic Acid, ED     Status: None   Collection Time: 03/22/16  9:43 PM  Result Value Ref Range   Lactic Acid, Venous 1.16 0.5 - 1.9 mmol/L   Assessment/Plan:  UTI UA and culture consistent with UTI. Currently being treated with keflex. Seems to be improving. Mental status back at baseline. Will continue keflex for total 10 days. Sensitivities not yet available - will follow up and adjust treatment if needed.   Algis Greenhouse. Jerline Pain, [place] Medicine Resident PGY-3 03/24/2016 12:24 PM

## 2016-03-25 LAB — URINE CULTURE

## 2016-03-26 ENCOUNTER — Telehealth (HOSPITAL_BASED_OUTPATIENT_CLINIC_OR_DEPARTMENT_OTHER): Payer: Self-pay | Admitting: *Deleted

## 2016-03-26 NOTE — Telephone Encounter (Signed)
Post ED Visit - Positive Culture Follow-up  Culture report reviewed by antimicrobial stewardship pharmacist:  []  Reginald Patterson, Pharm.D. [x]  Reginald Patterson, Pharm.D., BCPS []  Reginald Patterson, Pharm.D. []  Reginald Patterson, Pharm.D., BCPS []  Reginald Patterson, [place].D., BCPS, AAHIVP []  Reginald Patterson, Pharm.D., BCPS, AAHIVP []  Reginald Patterson, Pharm.D. []  Reginald Patterson, Pharm.D.  Positive urine culture Treated with Cephalexin, organism sensitive to the same and no further patient follow-up is required at this time.  Reginald Patterson 03/26/2016, 1:28 PM

## 2016-04-02 ENCOUNTER — Other Ambulatory Visit: Payer: Self-pay | Admitting: Family Medicine

## 2016-04-02 MED ORDER — VITAMIN C 1000 MG PO TABS: 1000.0000 mg | ORAL_TABLET | Freq: Two times a day (BID) | ORAL | Status: AC

## 2016-04-02 MED ORDER — METHENAMINE HIPPURATE 1 G PO TABS: 1.0000 g | ORAL_TABLET | Freq: Two times a day (BID) | ORAL | Status: DC

## 2016-04-02 MED ORDER — CRANBERRY 1000 MG PO CAPS: 1000.0000 mg | ORAL_CAPSULE | Freq: Three times a day (TID) | ORAL | Status: AC

## 2016-04-22 ENCOUNTER — Non-Acute Institutional Stay (INDEPENDENT_AMBULATORY_CARE_PROVIDER_SITE_OTHER): Payer: Medicare Other | Admitting: Family Medicine

## 2016-04-22 DIAGNOSIS — Z593 Problems related to living in residential institution: Secondary | ICD-10-CM

## 2016-04-22 NOTE — Progress Notes (Signed)
Subjective:     Patient ID: Reginald Patterson, male   DOB: Jun 08, 1940, 75 y.o.   MRN: [id number]  HPI [name] is a 75yo male currently residing in [facility]. Prior history of stroke, diabetes, hypertension, dementia, and CKD noted.  Wife is health care power of attorney. Currently DNR/DNI.  History of recurrent UTIs. Most recent UTI diagnosed on trip to ED, treated with course of Keflex; urine culture showed >100,000 CFU of Klebsiella pneumoniae, sensitive to Keflex. Recently seen by Urology on 11/7 and started on Cranberry and Methenamine.  Today, denying any pain or other concerns. Reports he is only sleepy and ready for a nap.   Review of Systems Per HPI.    Objective:   Physical Exam Constitutional: He is oriented to person, place, and time. He appears well-developed and well-nourished. No distress.  HENT:  Head: Normocephalic and atraumatic.  Cardiovascular: Normal rate.  Exam reveals no gallop and no friction rub.   No murmur heard. Pulmonary/Chest: Effort normal. No respiratory distress. He has no wheezes.  Abdominal: Soft. Bowel sounds are normal. He exhibits no distension. Mild epigastric tenderness. Musculoskeletal: He exhibits no edema.  Skin: No rash noted.  Psychiatric: He has a normal mood and affect. His behavior is normal.     Assessment and Plan:     # Hypertension: - Lisinopril 5mg  - Check blood pressures weekly  # Diabetes: - Last A1C 6.5 in 05/2015 - Metformin discontinued due to diarrhea, weight loss - Lantus 35units daily  # Depression/Anxiety: - Paxil 40mg . Effexor 75mg   # Hyperlipidemia: - Continue Atorvastatin  # Dementia: - Depakote 250mg  twice daily. S/p attempt to wean. - Melatonin 3mg  nightly to help with sleep - Fall precautions  # Incontinence with h/o Mechanical Balanitis/BPH: - Proscar 5mg , Flomax 0.8mg   # Recurrent UTIs: - Methenamine and Cranberry recommended by Urologist on 11/7. Methenamine unable to be  filled due to insurance. Cranberry and Ascorbic Acid initiated.  # Constipation: - Senokot 2 tablets daily  # Hypocalcemia: - Calcium Citrate-Vitamin D  # Weight Loss: - Improved and stable. Will transition to checking monthly. - Nutritionist consulted

## 2016-05-16 ENCOUNTER — Telehealth: Payer: Self-pay | Admitting: Family Medicine

## 2016-05-16 NOTE — Telephone Encounter (Signed)
AFTER HOURS CALL  Paged by [facility] nurse. Patient started on Keflex for UTI on 12/20.   They've been cathing him 5x/day for sometime. Typically they get around 500-800cc out with each cath.  They only got 200cc earlier this evening and 300cc just now. On discussion, pt notes he probably hasn't eaten or drank as much as he should.  He's had 2 episodes of vomiting at 10pm and 10:30pm. Emesis was NB/NB. He's had 1 episode of diarrhea this evening as well. No abdominal pain.   Patient had a small amount of soup this evening for dinner.  Patient is awake and alert  Temp: 98.4, BP:115/69, HR 75, RR 20 No abdominal tenderness Rectal vault is empty per nurse.   He's allergic to cipro and vicodin.   Most likely gastroenteritis vs side effect of medication. Patient hemodynamically stable and neurologically at his baseline.   Verbal order for Zofran ODT 4mg  q8hrs PRN nausea and vomiting.  Continue to monitor UOP, fever curve, push small amounts of fluid regularly, monitor mental status. If change in mental status, will need to be evaluated in ED. Advised to let MD know if worsening UOP, fevers, inability to take PO, or abdominal pain occur as pt may require lab work at [facility] with IVF vs evaluation in ED.  Archie Patten, MD [facility] Family Medicine Resident  05/17/2016, 12:07 AM

## 2016-05-27 DIAGNOSIS — R05 Cough: Secondary | ICD-10-CM | POA: Diagnosis not present

## 2016-06-02 ENCOUNTER — Other Ambulatory Visit: Payer: Self-pay | Admitting: Family Medicine

## 2016-06-02 MED ORDER — DIVALPROEX SODIUM ER 250 MG PO TB24: 500.0000 mg | ORAL_TABLET | Freq: Two times a day (BID) | ORAL | 0 refills | Status: DC

## 2016-06-02 NOTE — Progress Notes (Signed)
Patient assessed by psychiatry.  Recommendations are to increase Depakote to 500mg  BID and Taper Paxil 30mg  x1w, 20mg  x1w, 10mg  x1 week then stop.  Appreciate recommendations.  Ashly M. Lajuana Ripple, DO PGY-3, [facility] Family Medicine Residency

## 2016-06-04 DIAGNOSIS — I10 Essential (primary) hypertension: Secondary | ICD-10-CM | POA: Diagnosis not present

## 2016-06-04 DIAGNOSIS — D649 Anemia, unspecified: Secondary | ICD-10-CM | POA: Diagnosis not present

## 2016-06-04 LAB — BASIC METABOLIC PANEL
BUN: 13 mg/dL (ref 4–21)
CREATININE: 0.9 mg/dL (ref <=1.3)
Glucose: 209 mg/dL
Potassium: 4.6 mmol/L (ref 3.4–5.3)
Sodium: 141 mmol/L (ref 137–147)

## 2016-06-04 LAB — CBC AND DIFFERENTIAL
HCT: 35 % — AB (ref 41–53)
Hemoglobin: 11.2 g/dL — AB (ref 13.5–17.5)
PLATELETS: 309 10*3/uL (ref 150–399)
WBC: 6.3 10*3/mL

## 2016-06-13 ENCOUNTER — Non-Acute Institutional Stay: Payer: Medicare Other | Admitting: Family Medicine

## 2016-06-13 ENCOUNTER — Encounter: Payer: Self-pay | Admitting: Family Medicine

## 2016-06-13 DIAGNOSIS — K5909 Other constipation: Secondary | ICD-10-CM

## 2016-06-13 DIAGNOSIS — I70209 Unspecified atherosclerosis of native arteries of extremities, unspecified extremity: Secondary | ICD-10-CM

## 2016-06-13 DIAGNOSIS — E118 Type 2 diabetes mellitus with unspecified complications: Secondary | ICD-10-CM

## 2016-06-13 DIAGNOSIS — I7090 Unspecified atherosclerosis: Secondary | ICD-10-CM

## 2016-06-13 DIAGNOSIS — Z794 Long term (current) use of insulin: Secondary | ICD-10-CM | POA: Diagnosis not present

## 2016-06-13 DIAGNOSIS — I10 Essential (primary) hypertension: Secondary | ICD-10-CM

## 2016-06-13 DIAGNOSIS — F0150 Vascular dementia without behavioral disturbance: Secondary | ICD-10-CM

## 2016-06-13 DIAGNOSIS — F329 Major depressive disorder, single episode, unspecified: Secondary | ICD-10-CM | POA: Diagnosis not present

## 2016-06-13 DIAGNOSIS — E7800 Pure hypercholesterolemia, unspecified: Secondary | ICD-10-CM | POA: Diagnosis not present

## 2016-06-13 DIAGNOSIS — F32A Depression, unspecified: Secondary | ICD-10-CM

## 2016-06-13 NOTE — Progress Notes (Signed)
Subjective:     Patient ID: Reginald Patterson, male   DOB: 08-04-1940, 76 y.o.   MRN: [id number]  HPI Reginald Patterson is a 76yo male currently residing in [facility]. Prior history of stroke, diabetes, hypertension, dementia, and CKD noted.  Wife is health care power of attorney. Currently DNR/DNI.  History of recurrent UTIs and he reports this is one of his frustrations. Last UTI treated at the end of December. On 05/26/2016, he was noted to have a low grade fever and cold symptoms. Prescribed Afrin for three day course and Nasal Saline on 1/3. Today, denies cold symptoms but states he still feels tired after recovering from acute illness.   Also of note, was referred to Psychiatry on 12/23 and Paxil taper was initiated while Depakote was increased.  Continues to deny any pain. Denies any acute complaints.  Review of Systems Per HPI.    Objective:   Physical Exam Constitutional: He is oriented to person, place, and time. He appears well-developed and well-nourished. No distress.  HENT:  Head: Normocephalic and atraumatic.  Cardiovascular: Normal rate.  Exam reveals no gallop and no friction rub.   No murmur heard. Pulmonary/Chest: Effort normal. No respiratory distress. He has no wheezes.  Abdominal: Soft. Bowel sounds are normal. He exhibits no distension. Mild epigastric tenderness. Musculoskeletal: He exhibits no edema.  Skin: No rash noted.  Psychiatric: He has a normal mood and affect. His behavior is normal.     Assessment and Plan:     # Hypertension: Stable. Most recent 116/66. - Lisinopril 5mg  - Check blood pressures weekly  # Diabetes: - Last A1C 6.5 in 05/2015 - Metformin discontinued due to diarrhea, weight loss - Lantus 35units daily  # Depression/Anxiety: - Paxil 40mg  currently being tapered. Plan to decrease to 10mg  dose on 1/20 to continue for 7 days and then discontinue. Has already completed taper from 40mg  to 30mg  for 7 days, down to 20mg  for 7  days. - Effexor  # Hyperlipidemia: - Continue Atorvastatin  # Dementia: - Depakote 500mg  twice daily.  - Melatonin 3mg  nightly to help with sleep - Fall precautions  # Incontinence with h/o Mechanical Balanitis/BPH: - Proscar 5mg , Flomax 0.8mg   # Constipation: - Senokot 2 tablets daily

## 2016-06-27 ENCOUNTER — Encounter: Payer: Self-pay | Admitting: Family Medicine

## 2016-06-27 DIAGNOSIS — K5900 Constipation, unspecified: Secondary | ICD-10-CM

## 2016-06-27 HISTORY — DX: Constipation, unspecified: K59.00

## 2016-06-27 NOTE — Progress Notes (Signed)
I have interviewed and examined the patient.  I have discussed the case and verified the key findings with Dr. Gerlean Ren.   I agree with their assessments and plans as documented in their regulatory visit note.

## 2016-07-01 DIAGNOSIS — N390 Urinary tract infection, site not specified: Secondary | ICD-10-CM | POA: Diagnosis not present

## 2016-07-01 DIAGNOSIS — R319 Hematuria, unspecified: Secondary | ICD-10-CM | POA: Diagnosis not present

## 2016-07-01 DIAGNOSIS — D649 Anemia, unspecified: Secondary | ICD-10-CM | POA: Diagnosis not present

## 2016-07-01 DIAGNOSIS — I10 Essential (primary) hypertension: Secondary | ICD-10-CM | POA: Diagnosis not present

## 2016-07-02 ENCOUNTER — Non-Acute Institutional Stay (INDEPENDENT_AMBULATORY_CARE_PROVIDER_SITE_OTHER): Payer: Medicare Other | Admitting: Family Medicine

## 2016-07-02 ENCOUNTER — Encounter: Payer: Self-pay | Admitting: Family Medicine

## 2016-07-02 DIAGNOSIS — N390 Urinary tract infection, site not specified: Secondary | ICD-10-CM

## 2016-07-02 DIAGNOSIS — N312 Flaccid neuropathic bladder, not elsewhere classified: Secondary | ICD-10-CM

## 2016-07-02 MED ORDER — CEPHALEXIN 500 MG PO CAPS: 500.0000 mg | ORAL_CAPSULE | Freq: Four times a day (QID) | ORAL | 0 refills | Status: DC

## 2016-07-02 NOTE — Progress Notes (Signed)
[facility] Visit  [name] is alone Sources of clinical information for visit is/are patient and nursing home.  HISTORY OF PRESENT ILLNESS: Reginald Patterson is a 76 y.o.  male.    No chief complaint on file.   HPI:  Patient reports dysuria x1 day.  He denies nausea, vomiting, fevers, chills, abdominal pain, back pain.  He self in and out caths for neurogenic bladder.  Last UTI 03/22/16, successfully treated with keflex.   History Patient Active Problem List   Diagnosis Date Noted  . Constipation 06/27/2016  . Neurogenic bladder, flaccid   . Complicated UTI (urinary tract infection) 08/20/2015  . Colon polyps   . 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 Falls frequently 06/22/2015  . Chronic low back pain 11/24/2014  . Anemia 10/04/2011  . Depression 10/04/2011  . CVA (cerebral infarction) 10/02/2011  . HTN (hypertension) 10/02/2011  . Hyperlipidemia 10/02/2011  . Diabetes mellitus, type 2 ([place]) 10/02/2011     Medications  Current Outpatient Prescriptions:  .  Ascorbic Acid (VITAMIN C) 1000 MG tablet, Take 1 tablet (1,000 mg total) by mouth 2 (two) times daily., Disp: , Rfl:  .  aspirin 81 MG chewable tablet, Chew 81 mg by mouth daily., Disp: , Rfl:  .  atorvastatin (LIPITOR) 40 MG tablet, Take 40 mg by mouth at bedtime., Disp: , Rfl:  .  Calcium Citrate-Vitamin D (CALCIUM CITRATE + D PO), Take 1,000 mg by mouth daily. For low Ca levels, Disp: , Rfl:  .  cephALEXin (KEFLEX) 500 MG capsule, Take 1 capsule (500 mg total) by mouth 4 (four) times daily. x10 days, Disp: 40 capsule, Rfl: 0 .  Cranberry 1000 MG CAPS, Take 1 capsule (1,000 mg total) by mouth 3 (three) times daily., Disp: 30 each, Rfl:  .  divalproex (DEPAKOTE ER) 250 MG 24 hr tablet, Take 2 tablets (500 mg total) by mouth 2 (two) times daily., Disp: 60 tablet, Rfl: 0 .  finasteride (PROSCAR) 5 MG  tablet, Take 5 mg by mouth daily., Disp: , Rfl:  .  ibuprofen (ADVIL,MOTRIN) 200 MG tablet, Take 200 mg by mouth every 6 (six) hours as needed for pain. Reported on 08/20/2015, Disp: , Rfl:  .  Insulin Glargine (LANTUS SOLOSTAR) 100 UNIT/ML SOPN, Inject 35 Units into the skin daily at 10 pm. , Disp: , Rfl:  .  lisinopril (PRINIVIL,ZESTRIL) 5 MG tablet, Take 5 mg by mouth every morning., Disp: , Rfl:  .  Melatonin 3 MG TABS, Take 3 mg by mouth every evening., Disp: , Rfl:  .  methenamine (HIPREX) 1 g tablet, Take 1 tablet (1 g total) by mouth 2 (two) times daily with a meal., Disp: , Rfl:  .  Multiple Vitamin (MULTIVITAMIN WITH MINERALS) TABS, Take 1 tablet by mouth daily., Disp: , Rfl:  .  Olopatadine HCl 0.2 % SOLN, Place 1 drop into both eyes daily., Disp: , Rfl:  .  pantoprazole (PROTONIX) 20 MG tablet, Take 20 mg by mouth daily., Disp: , Rfl:  .  PARoxetine (PAXIL) 40 MG tablet, Take 1 tablet (40 mg total) by mouth at bedtime., Disp: , Rfl:  .  senna (SENOKOT) 8.6 MG TABS tablet, Take 2 tablets by mouth at bedtime., Disp: , Rfl:  .  tamsulosin (FLOMAX) 0.4 MG CAPS capsule, Take 0.8 mg by mouth daily., Disp: , Rfl:  .  venlafaxine XR (EFFEXOR-XR) 75 MG 24 hr capsule, Take  75 mg by mouth daily., Disp: , Rfl:  .  Wheat Dextrin (BENEFIBER PO), Take 7.5 mLs by mouth daily. Reported on 08/17/2015, Disp: , Rfl:    Vitals:   07/01/16 1612  BP: 124/75  Pulse: 67  Resp: 16  Temp: 97.3 F (36.3 C)  SpO2: 96%    Wt Readings from Last 3 Encounters:  03/22/16 182 lb (82.6 kg)  01/10/16 183 lb (83 kg)  08/17/15 156 lb (70.8 kg)     Review of Systems:  Per HPI  PHYSICAL EXAM:. General: No acute distress, well nourished, pleasant, ambulating from restroom ABD:  Soft, Non-tender, non-distended, +bowel sounds, no masses Psych:  Fully oriented, Judgment and insight normal, Memory normal for long and short term, Mood and affect appropriate   Assessment(s)/ Plan: 1. Recurrent UTI.  UA with  1+Leuk esterase, trace blood, 1+ bacteria, nitrite negative.  VS stable.  No evidence of pyelonephritis.  Has allergy to Cipro, so will treat with Keflex 500mg  QID x10d. - Urine culture pending - Keflex 500mg  QID x10 days, written orders placed in patient's chart at [facility]. - Will f/u Friday afternoon to assess response to abx.  2. Neurogenic bladder, flaccid  Meds ordered this encounter  Medications  . cephALEXin (KEFLEX) 500 MG capsule    Sig: Take 1 capsule (500 mg total) by mouth 4 (four) times daily. x10 days    Dispense:  40 capsule    Refill:  0   Code Status:     Code Status History    Date Active Date Inactive Code Status Order ID Comments User Context   06/16/2015  9:31 PM 06/18/2015 10:11 PM Full Code [id number]  Hosie Poisson, MD Inpatient   04/17/2015 10:27 AM 04/18/2015  3:25 AM Full Code [id number]  Logan Bores, MD H[facility]        There are no discontinued medications.    Montrail Mehrer M. Lajuana Ripple, DO PGY-3, [facility] Family Medicine Residency

## 2016-07-05 ENCOUNTER — Telehealth: Payer: Self-pay | Admitting: Family Medicine

## 2016-07-05 NOTE — Telephone Encounter (Signed)
Received call on after hours line from [facility]. Stated that patient had a fall earlier in the day with no obvious sequela. However, over the past few hours of the evening, he has had elevated blood pressures including 218/62, 216/11, and his most recent measurement of 180/96. Nursing staff reported that all of these values were verified via manual cuff. Pulse rate has been between 70s-80s and patient has no other vital sign abnormalities. No chest pain, headache, or obvious neurological deficits. I gave a verbal order for a one time dose of clonidine 0.1mg  now with repeat blood pressure in one hour and asked the staff to call back if the systolic continued to be greater than 180. Advised staff to send patient to ED if BP persistent above 220/120, or if started developing neurological symptoms or chest pain.   Algis Greenhouse. Jerline Pain, [place] Resident PGY-3 07/05/2016 10:49 PM

## 2016-07-06 ENCOUNTER — Telehealth: Payer: Self-pay | Admitting: Internal Medicine

## 2016-07-06 ENCOUNTER — Inpatient Hospital Stay (HOSPITAL_COMMUNITY)
Admission: EM | Admit: 2016-07-06 | Discharge: 2016-07-09 | DRG: 871 | Disposition: A | Discharge status: Skilled Nursing Facility | Payer: Medicare Other | Attending: Family Medicine | Admitting: Family Medicine

## 2016-07-06 ENCOUNTER — Emergency Department (HOSPITAL_COMMUNITY): Payer: Medicare Other

## 2016-07-06 ENCOUNTER — Encounter (HOSPITAL_COMMUNITY): Payer: Self-pay | Admitting: Emergency Medicine

## 2016-07-06 DIAGNOSIS — Z794 Long term (current) use of insulin: Secondary | ICD-10-CM

## 2016-07-06 DIAGNOSIS — N138 Other obstructive and reflux uropathy: Secondary | ICD-10-CM | POA: Diagnosis not present

## 2016-07-06 DIAGNOSIS — Z87891 Personal history of nicotine dependence: Secondary | ICD-10-CM | POA: Diagnosis not present

## 2016-07-06 DIAGNOSIS — E872 Acidosis: Secondary | ICD-10-CM | POA: Diagnosis not present

## 2016-07-06 DIAGNOSIS — Z801 Family history of malignant neoplasm of trachea, bronchus and lung: Secondary | ICD-10-CM | POA: Diagnosis not present

## 2016-07-06 DIAGNOSIS — F0150 Vascular dementia without behavioral disturbance: Secondary | ICD-10-CM | POA: Diagnosis present

## 2016-07-06 DIAGNOSIS — Z823 Family history of stroke: Secondary | ICD-10-CM

## 2016-07-06 DIAGNOSIS — G9340 Encephalopathy, unspecified: Secondary | ICD-10-CM | POA: Diagnosis not present

## 2016-07-06 DIAGNOSIS — F329 Major depressive disorder, single episode, unspecified: Secondary | ICD-10-CM | POA: Diagnosis present

## 2016-07-06 DIAGNOSIS — N312 Flaccid neuropathic bladder, not elsewhere classified: Secondary | ICD-10-CM | POA: Diagnosis present

## 2016-07-06 DIAGNOSIS — A419 Sepsis, unspecified organism: Secondary | ICD-10-CM | POA: Diagnosis not present

## 2016-07-06 DIAGNOSIS — E119 Type 2 diabetes mellitus without complications: Secondary | ICD-10-CM

## 2016-07-06 DIAGNOSIS — R338 Other retention of urine: Secondary | ICD-10-CM | POA: Diagnosis present

## 2016-07-06 DIAGNOSIS — Z8673 Personal history of transient ischemic attack (TIA), and cerebral infarction without residual deficits: Secondary | ICD-10-CM

## 2016-07-06 DIAGNOSIS — I10 Essential (primary) hypertension: Secondary | ICD-10-CM | POA: Diagnosis present

## 2016-07-06 DIAGNOSIS — R05 Cough: Secondary | ICD-10-CM | POA: Diagnosis not present

## 2016-07-06 DIAGNOSIS — Z79899 Other long term (current) drug therapy: Secondary | ICD-10-CM

## 2016-07-06 DIAGNOSIS — R296 Repeated falls: Secondary | ICD-10-CM | POA: Diagnosis present

## 2016-07-06 DIAGNOSIS — N12 Tubulo-interstitial nephritis, not specified as acute or chronic: Secondary | ICD-10-CM | POA: Diagnosis not present

## 2016-07-06 DIAGNOSIS — G9389 Other specified disorders of brain: Secondary | ICD-10-CM | POA: Diagnosis not present

## 2016-07-06 DIAGNOSIS — Z87440 Personal history of urinary (tract) infections: Secondary | ICD-10-CM

## 2016-07-06 DIAGNOSIS — E785 Hyperlipidemia, unspecified: Secondary | ICD-10-CM | POA: Diagnosis not present

## 2016-07-06 DIAGNOSIS — Z820 Family history of epilepsy and other diseases of the nervous system: Secondary | ICD-10-CM

## 2016-07-06 DIAGNOSIS — R509 Fever, unspecified: Secondary | ICD-10-CM | POA: Diagnosis present

## 2016-07-06 DIAGNOSIS — N390 Urinary tract infection, site not specified: Secondary | ICD-10-CM | POA: Diagnosis not present

## 2016-07-06 DIAGNOSIS — Z800 Family history of malignant neoplasm of digestive organs: Secondary | ICD-10-CM

## 2016-07-06 DIAGNOSIS — N401 Enlarged prostate with lower urinary tract symptoms: Secondary | ICD-10-CM | POA: Diagnosis not present

## 2016-07-06 DIAGNOSIS — J9600 Acute respiratory failure, unspecified whether with hypoxia or hypercapnia: Secondary | ICD-10-CM

## 2016-07-06 DIAGNOSIS — J159 Unspecified bacterial pneumonia: Secondary | ICD-10-CM | POA: Diagnosis present

## 2016-07-06 DIAGNOSIS — Y95 Nosocomial condition: Secondary | ICD-10-CM | POA: Diagnosis present

## 2016-07-06 HISTORY — DX: Unspecified bacterial pneumonia: J15.9

## 2016-07-06 LAB — URINALYSIS, ROUTINE W REFLEX MICROSCOPIC
BILIRUBIN URINE: NEGATIVE
Glucose, UA: >=500 mg/dL — AB
HGB URINE DIPSTICK: NEGATIVE
KETONES UR: NEGATIVE mg/dL
NITRITE: NEGATIVE
PH: 7 (ref 5.0–8.0)
Protein, ur: NEGATIVE mg/dL
SPECIFIC GRAVITY, URINE: 1.001 — AB (ref 1.005–1.030)

## 2016-07-06 LAB — PROTIME-INR
INR: 1.02
PROTHROMBIN TIME: 13.4 s (ref 11.4–15.2)

## 2016-07-06 LAB — CBC WITH DIFFERENTIAL/PLATELET
BASOS PCT: 0 %
Basophils Absolute: 0 10*3/uL (ref 0.0–0.1)
EOS ABS: 0.5 10*3/uL (ref 0.0–0.7)
Eosinophils Relative: 9 %
HEMATOCRIT: 36.4 % — AB (ref 39.0–52.0)
HEMOGLOBIN: 12 g/dL — AB (ref 13.0–17.0)
LYMPHS ABS: 1.5 10*3/uL (ref 0.7–4.0)
Lymphocytes Relative: 25 %
MCH: 27.5 pg (ref 26.0–34.0)
MCHC: 33 g/dL (ref 30.0–36.0)
MCV: 83.5 fL (ref 78.0–100.0)
MONO ABS: 0.7 10*3/uL (ref 0.1–1.0)
MONOS PCT: 12 %
NEUTROS PCT: 54 %
Neutro Abs: 3.1 10*3/uL (ref 1.7–7.7)
Platelets: 230 10*3/uL (ref 150–400)
RBC: 4.36 MIL/uL (ref 4.22–5.81)
RDW: 15.3 % (ref 11.5–15.5)
WBC: 5.9 10*3/uL (ref 4.0–10.5)

## 2016-07-06 LAB — COMPREHENSIVE METABOLIC PANEL
ALBUMIN: 3.3 g/dL — AB (ref 3.5–5.0)
ALK PHOS: 74 U/L (ref 38–126)
ALT: 14 U/L — ABNORMAL LOW (ref 17–63)
ANION GAP: 8 (ref 5–15)
AST: 17 U/L (ref 15–41)
BILIRUBIN TOTAL: 0.5 mg/dL (ref 0.3–1.2)
BUN: 10 mg/dL (ref 6–20)
CALCIUM: 9.3 mg/dL (ref 8.9–10.3)
CO2: 24 mmol/L (ref 22–32)
Chloride: 104 mmol/L (ref 101–111)
Creatinine, Ser: 1 mg/dL (ref 0.61–1.24)
GFR calc Af Amer: >60 mL/min (ref >60)
GFR calc non Af Amer: >60 mL/min (ref >60)
GLUCOSE: 236 mg/dL — AB (ref 65–99)
Potassium: 3.9 mmol/L (ref 3.5–5.1)
SODIUM: 136 mmol/L (ref 135–145)
TOTAL PROTEIN: 6 g/dL — AB (ref 6.5–8.1)

## 2016-07-06 LAB — I-STAT CG4 LACTIC ACID, ED: Lactic Acid, Venous: 1.94 mmol/L (ref 0.5–1.9)

## 2016-07-06 MED ORDER — SODIUM CHLORIDE 0.9 % IV BOLUS (SEPSIS)
500.0000 mL | Freq: Once | INTRAVENOUS | Status: AC
  Administered 2016-07-07: 500 mL via INTRAVENOUS

## 2016-07-06 MED ORDER — SODIUM CHLORIDE 0.9 % IV BOLUS (SEPSIS)
1000.0000 mL | Freq: Once | INTRAVENOUS | Status: AC
  Administered 2016-07-06: 1000 mL via INTRAVENOUS

## 2016-07-06 MED ORDER — VANCOMYCIN HCL IN DEXTROSE 1-5 GM/200ML-% IV SOLN
1000.0000 mg | Freq: Once | INTRAVENOUS | Status: AC
  Administered 2016-07-06: 1000 mg via INTRAVENOUS
  Filled 2016-07-06: qty 200

## 2016-07-06 MED ORDER — PIPERACILLIN-TAZOBACTAM 3.375 G IVPB 30 MIN
3.3750 g | Freq: Once | INTRAVENOUS | Status: AC
  Administered 2016-07-06: 3.375 g via INTRAVENOUS
  Filled 2016-07-06: qty 50

## 2016-07-06 NOTE — Telephone Encounter (Signed)
Received page to Emergency After Hours line from [place] that Mr. Stavropoulos has been febrile to 101.9 F tonight, had a fall yesterday, and has been more confused. His BP has also been elevated--up to 199/88 tonight. He is on Day 5 of 10 of keflex for presumed UTI. He required a dose of clonidine overnight last night for SBPs in the 200s. Melissa reports he has also had a bit of a cough. Advised giving 650 mg tylenol and, after speaking with attending Dr. McDiarmid, transferring to ED to rule out other source of infection like PNA.  Olene Floss, MD [place], PGY-2

## 2016-07-06 NOTE — ED Notes (Signed)
Patient transported to CT 

## 2016-07-06 NOTE — ED Provider Notes (Signed)
[facility] DEPT Provider Note   CSN: [id number] Arrival date & time: 07/06/16  2121     History   Chief Complaint Chief Complaint  Patient presents with  . Fever  . Urinary Tract Infection    HPI Reginald Patterson is a 76 y.o. male.  This a 76 year old male who lives at [place] home, status post several strokes and frequent urinary tract infections is currently being treated for UTI with by mouth Keflex, who had acute change in mental status over the past 12 hours a productive cough, fever. Patient self catheters twice a day this is been going on for several years.  He in a  wheelchair with occasional ambulation with help.  Wife states that yesterday while transferring from the wheelchair to the bed.  He did sustain a fall.  Unknown if he did hit his head or not, patient cannot articulate/recall fall      Past Medical History:  Diagnosis Date  . Acute encephalopathy 06/16/2015  . Agitation 02/20/2015  . Anemia 11-08-12   mild anemia  . Anxiety   . Anxiety state 06/22/2015  . Chronic kidney disease    must self cath  . Colon polyps   . Constipation 06/27/2016  . CVA (cerebral infarction) 10/02/2011  . Dementia 06/22/2015  . Depression 10/04/2011  . Diabetes mellitus, type 2 ([place]) 10/02/2011  . Encephalopathy acute 06/17/2015  . Falls frequently 06/22/2015  . Fecal incontinence   . Fracture of rib of right side 06/15/2015   8th right posterior rib fracture   . Healthcare associated bacterial pneumonia 07/08/2016  . HTN (hypertension) 10/02/2011  . Hyperlipidemia   . Hypertension   . Incontinence 06/22/2015  . Infection of urinary tract 07/25/2015  . Leg weakness 02/20/2015  . 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 Stroke [facility]) 11-08-12   x2 , last 09-29-11(confusion,left side weakness) no residual"some memory problems.  Marland Kitchen UTI (lower urinary tract infection) 06/16/15; 07/25/14  . Weight loss, unintentional 07/25/2015    Patient Active Problem List   Diagnosis Date Noted  .  Nursing home resident 07/09/2016  . Healthcare associated bacterial pneumonia 07/08/2016  . Febrile illness   . UTI (urinary tract infection) 07/07/2016  . Pyelonephritis, presumed 07/07/2016  . Vascular dementia without behavioral disturbance   . Constipation 06/27/2016  . Neurogenic bladder, flaccid   . Complicated UTI (urinary tract infection) 08/20/2015  . Colon polyps   . 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 Falls frequently 06/22/2015  . Acute delirium 06/17/2015  . Acute encephalopathy 06/16/2015  . Chronic low back pain 11/24/2014  . Anemia 10/04/2011  . Depression 10/04/2011  . CVA (cerebral infarction) 10/02/2011  . HTN (hypertension) 10/02/2011  . Hyperlipidemia 10/02/2011  . Diabetes mellitus, type 2 ([place]) 10/02/2011    Past Surgical History:  Procedure Laterality Date  . [place]- metal plate and screws for degenerative changes spine  . COLONOSCOPY WITH PROPOFOL N/A 01/12/2013   Procedure: COLONOSCOPY WITH PROPOFOL;  Surgeon: Arta Silence, MD;  Location: [facility] END[facility];  Service: Endoscopy;  Laterality: N/A;       Home Medications    Prior to Admission medications   Medication Sig Start Date End Date Taking? Authorizing Provider  acetaminophen (TYLENOL) 325 MG tablet Take 650 mg by mouth once.   Yes Historical Provider, MD  Ascorbic Acid (VITAMIN C) 1000 MG tablet Take 1 tablet (1,000 mg total) by mouth  2 (two) times daily. 04/02/16  Yes Vivi Barrack, MD  aspirin 81 MG chewable tablet Chew 81 mg by mouth daily.   Yes Historical Provider, MD  atorvastatin (LIPITOR) 40 MG tablet Take 40 mg by mouth at bedtime. 07/19/15  Yes Historical Provider, MD  bisacodyl (DULCOLAX) 10 MG suppository Place 10 mg rectally as needed for moderate constipation.   Yes Historical Provider, MD  Calcium Citrate-Vitamin D (CALCIUM CITRATE + D PO) Take 1,000 mg by mouth  daily. For low Ca levels   Yes Historical Provider, MD  cloNIDine (CATAPRES) 0.1 MG tablet Take 0.1 mg by mouth once.   Yes Historical Provider, MD  Cranberry 1000 MG CAPS Take 1 capsule (1,000 mg total) by mouth 3 (three) times daily. 04/02/16  Yes Vivi Barrack, MD  divalproex (DEPAKOTE) 500 MG DR tablet Take 500 mg by mouth 2 (two) times daily.   Yes Historical Provider, MD  finasteride (PROSCAR) 5 MG tablet Take 5 mg by mouth daily.   Yes Historical Provider, MD  ibuprofen (ADVIL,MOTRIN) 200 MG tablet Take 200 mg by mouth every 6 (six) hours as needed for pain. Reported on 08/20/2015   Yes Historical Provider, MD  Insulin Glargine (LANTUS SOLOSTAR) 100 UNIT/ML SOPN Inject 35 Units into the skin daily at 10 pm.    Yes Historical Provider, MD  lisinopril (PRINIVIL,ZESTRIL) 5 MG tablet Take 5 mg by mouth every morning.   Yes Historical Provider, MD  magnesium hydroxide (MILK OF MAGNESIA) 400 MG/5ML suspension Take 30 mLs by mouth daily as needed for mild constipation.   Yes Historical Provider, MD  Multiple Vitamin (MULTIVITAMIN WITH MINERALS) TABS Take 1 tablet by mouth daily.   Yes Historical Provider, MD  Olopatadine HCl 0.2 % SOLN Place 1 drop into both eyes daily.   Yes Historical Provider, MD  ondansetron (ZOFRAN-ODT) 4 MG disintegrating tablet Take 4 mg by mouth every 8 (eight) hours as needed for nausea or vomiting.   Yes Historical Provider, MD  pantoprazole (PROTONIX) 20 MG tablet Take 20 mg by mouth daily.   Yes Historical Provider, MD  senna (SENOKOT) 8.6 MG TABS tablet Take 2 tablets by mouth at bedtime.   Yes Historical Provider, MD  sodium chloride (OCEAN) 0.65 % SOLN nasal spray Place 2 sprays into both nostrils 2 (two) times daily as needed for congestion.   Yes Historical Provider, MD  Sodium Phosphates (RA SALINE ENEMA) 19-7 GM/118ML ENEM Place 1 each rectally as needed (for constipation).   Yes Historical Provider, MD  tamsulosin (FLOMAX) 0.4 MG CAPS capsule Take 0.8 mg by mouth  daily.   Yes Historical Provider, MD  venlafaxine XR (EFFEXOR-XR) 75 MG 24 hr capsule Take 75 mg by mouth daily. 08/16/15  Yes Historical Provider, MD  Wheat Dextrin (BENEFIBER PO) Take 7.5 mLs by mouth daily. Reported on 08/17/2015   Yes Historical Provider, MD  amoxicillin-clavulanate (AUGMENTIN) 875-125 MG tablet Take 1 tablet by mouth 2 (two) times daily. 07/09/16   Ashly Windell Moulding, DO  divalproex (DEPAKOTE ER) 250 MG 24 hr tablet Take 2 tablets (500 mg total) by mouth 2 (two) times daily. Patient not taking: Reported on 07/06/2016 06/02/16   Janora Norlander, DO  methenamine (HIPREX) 1 g tablet Take 1 tablet (1 g total) by mouth 2 (two) times daily with a meal. Patient not taking: Reported on 07/06/2016 04/02/16   Vivi Barrack, MD    Family History Family History  Problem Relation Age of Onset  . Dementia Mother   .  Alzheimer's disease Mother   . Stroke Mother   . Lung cancer Father   . Colon cancer Maternal Grandmother     Social History Social History  Substance Use Topics  . Smoking status: Former Smoker    Packs/day: 1.00    Years: 5.00    Types: Cigarettes  . Smokeless tobacco: Former Systems developer    Types: Chew    Quit date: 05/26/1962  . Alcohol use Yes     Comment: only rarely     Allergies   Metformin and related; Ciprofloxacin; and Vicodin [hydrocodone-acetaminophen]   Review of Systems Review of Systems  Constitutional: Positive for fever.  Respiratory: Positive for cough.   Neurological: Positive for weakness.  All other systems reviewed and are negative.    Physical Exam Updated Vital Signs BP 133/75 (BP Location: Left Arm)   Pulse (!) 57   Temp 97.3 F (36.3 C) (Oral)   Resp 17   Ht 5\' 7"  (1.702 m)   Wt 82.2 kg   SpO2 94%   BMI 28.38 kg/m   Physical Exam  Constitutional: He appears well-developed and well-nourished.  Eyes: Pupils are equal, round, and reactive to light.  Neck: Normal range of motion.  Cardiovascular: Normal rate.     Pulmonary/Chest: Effort normal.  cough  Abdominal: Soft.  Musculoskeletal: Normal range of motion.  Neurological: He is alert.  Skin: Skin is warm. There is pallor.  Nursing note and vitals reviewed.    ED Treatments / Results  Labs (all labs ordered are listed, but only abnormal results are displayed) Labs Reviewed  COMPREHENSIVE METABOLIC PANEL - Abnormal; Notable for the following:       Result Value   Glucose, Bld 236 (*)    Total Protein 6.0 (*)    Albumin 3.3 (*)    ALT 14 (*)    All other components within normal limits  CBC WITH DIFFERENTIAL/PLATELET - Abnormal; Notable for the following:    Hemoglobin 12.0 (*)    HCT 36.4 (*)    All other components within normal limits  URINALYSIS, ROUTINE W REFLEX MICROSCOPIC - Abnormal; Notable for the following:    Color, Urine STRAW (*)    Specific Gravity, Urine 1.001 (*)    Glucose, UA >=500 (*)    Leukocytes, UA LARGE (*)    Bacteria, UA FEW (*)    Squamous Epithelial / LPF 0-5 (*)    All other components within normal limits  BASIC METABOLIC PANEL - Abnormal; Notable for the following:    Glucose, Bld 143 (*)    Calcium 8.7 (*)    All other components within normal limits  CBC - Abnormal; Notable for the following:    Hemoglobin 11.8 (*)    HCT 36.3 (*)    RDW 15.7 (*)    All other components within normal limits  GLUCOSE, CAPILLARY - Abnormal; Notable for the following:    Glucose-Capillary 181 (*)    All other components within normal limits  GLUCOSE, CAPILLARY - Abnormal; Notable for the following:    Glucose-Capillary 146 (*)    All other components within normal limits  GLUCOSE, CAPILLARY - Abnormal; Notable for the following:    Glucose-Capillary 146 (*)    All other components within normal limits  VALPROIC ACID LEVEL - Abnormal; Notable for the following:    Valproic Acid Lvl 36 (*)    All other components within normal limits  GLUCOSE, CAPILLARY - Abnormal; Notable for the following:     Glucose-Capillary  271 (*)    All other components within normal limits  GLUCOSE, CAPILLARY - Abnormal; Notable for the following:    Glucose-Capillary 150 (*)    All other components within normal limits  GLUCOSE, CAPILLARY - Abnormal; Notable for the following:    Glucose-Capillary 197 (*)    All other components within normal limits  GLUCOSE, CAPILLARY - Abnormal; Notable for the following:    Glucose-Capillary 165 (*)    All other components within normal limits  GLUCOSE, CAPILLARY - Abnormal; Notable for the following:    Glucose-Capillary 164 (*)    All other components within normal limits  GLUCOSE, CAPILLARY - Abnormal; Notable for the following:    Glucose-Capillary 280 (*)    All other components within normal limits  GLUCOSE, CAPILLARY - Abnormal; Notable for the following:    Glucose-Capillary 168 (*)    All other components within normal limits  CBC - Abnormal; Notable for the following:    Hemoglobin 12.2 (*)    HCT 37.8 (*)    All other components within normal limits  BASIC METABOLIC PANEL - Abnormal; Notable for the following:    Glucose, Bld 153 (*)    All other components within normal limits  GLUCOSE, CAPILLARY - Abnormal; Notable for the following:    Glucose-Capillary 163 (*)    All other components within normal limits  GLUCOSE, CAPILLARY - Abnormal; Notable for the following:    Glucose-Capillary 153 (*)    All other components within normal limits  GLUCOSE, CAPILLARY - Abnormal; Notable for the following:    Glucose-Capillary 258 (*)    All other components within normal limits  I-STAT CG4 LACTIC ACID, ED - Abnormal; Notable for the following:    Lactic Acid, Venous 1.94 (*)    All other components within normal limits  CULTURE, BLOOD (ROUTINE X 2)  CULTURE, BLOOD (ROUTINE X 2)  URINE CULTURE  PROTIME-INR  INFLUENZA PANEL BY PCR (TYPE A & B)  I-STAT CG4 LACTIC ACID, ED  I-STAT CG4 LACTIC ACID, ED    EKG  EKG Interpretation  Date/Time:  Sunday  July 06 2016 22:05:32 EST Ventricular Rate:  60 PR Interval:    QRS Duration: 96 QT Interval:  431 QTC Calculation: 431 R Axis:   57 Text Interpretation:  Sinus rhythm Low voltage, extremity leads similar to Jan 2017 Confirmed by Regenia Skeeter MD, SCOTT [phone]) on 07/07/2016 4:54:03 PM       Radiology No results found.  Procedures Procedures (including critical care time)  Medications Ordered in ED Medications  sodium chloride 0.9 % bolus 1,000 mL (0 mLs Intravenous Stopped 07/06/16 2256)    And  sodium chloride 0.9 % bolus 1,000 mL (0 mLs Intravenous Stopped 07/07/16 0021)    And  sodium chloride 0.9 % bolus 500 mL (0 mLs Intravenous Stopped 07/07/16 0116)  vancomycin (VANCOCIN) IVPB 1000 mg/200 mL premix (0 mg Intravenous Stopped 07/07/16 0021)  piperacillin-tazobactam (ZOSYN) IVPB 3.375 g (0 g Intravenous Stopped 07/06/16 2255)     Initial Impression / Assessment and Plan / ED Course  I have reviewed the triage vital signs and the nursing notes.  Pertinent labs & imaging results that were available during my care of the patient were reviewed by me and considered in my medical decision making (see chart for details).       Final Clinical Impressions(s) / ED Diagnoses   Final diagnoses:  Acute encephalopathy  Acute respiratory failure [facility])    New Prescriptions Discharge Medication List as  of 07/09/2016  1:09 PM    START taking these medications   Details  doxycycline (VIBRA-TABS) 100 MG tablet Take 1 tablet (100 mg total) by mouth every 12 (twelve) hours., Starting Wed 07/09/2016, Until Sun 07/13/2016, Normal         Junius Creamer, NP 07/18/16 2047    Leo Grosser, MD 07/19/16 [phone]

## 2016-07-06 NOTE — ED Notes (Signed)
Pt's wife reports that pt sin I/O cathed at [facility] 1-2 times a shift.

## 2016-07-06 NOTE — ED Triage Notes (Signed)
Pt BIB EMS from [facility] for fever of 101. Pt taking keflex for UTI since Tuesday. Per EMS, [employer] staff states pt "more altered." Per pt spouse pt has had nonproductive cough, decreased oral intake,  and "hasn't been as with it today." Pt has hx of dementia. Alert to person and place; disoriented to time. Resp e/u; NAD noted.

## 2016-07-07 ENCOUNTER — Inpatient Hospital Stay (HOSPITAL_COMMUNITY): Payer: Medicare Other

## 2016-07-07 DIAGNOSIS — Z820 Family history of epilepsy and other diseases of the nervous system: Secondary | ICD-10-CM | POA: Diagnosis not present

## 2016-07-07 DIAGNOSIS — Z801 Family history of malignant neoplasm of trachea, bronchus and lung: Secondary | ICD-10-CM | POA: Diagnosis not present

## 2016-07-07 DIAGNOSIS — Z823 Family history of stroke: Secondary | ICD-10-CM | POA: Diagnosis not present

## 2016-07-07 DIAGNOSIS — N401 Enlarged prostate with lower urinary tract symptoms: Secondary | ICD-10-CM | POA: Diagnosis present

## 2016-07-07 DIAGNOSIS — N390 Urinary tract infection, site not specified: Secondary | ICD-10-CM | POA: Diagnosis present

## 2016-07-07 DIAGNOSIS — Z466 Encounter for fitting and adjustment of urinary device: Secondary | ICD-10-CM | POA: Diagnosis not present

## 2016-07-07 DIAGNOSIS — E872 Acidosis: Secondary | ICD-10-CM | POA: Diagnosis present

## 2016-07-07 DIAGNOSIS — E118 Type 2 diabetes mellitus with unspecified complications: Secondary | ICD-10-CM

## 2016-07-07 DIAGNOSIS — E119 Type 2 diabetes mellitus without complications: Secondary | ICD-10-CM | POA: Diagnosis present

## 2016-07-07 DIAGNOSIS — I129 Hypertensive chronic kidney disease with stage 1 through stage 4 chronic kidney disease, or unspecified chronic kidney disease: Secondary | ICD-10-CM | POA: Diagnosis not present

## 2016-07-07 DIAGNOSIS — M6281 Muscle weakness (generalized): Secondary | ICD-10-CM | POA: Diagnosis not present

## 2016-07-07 DIAGNOSIS — N400 Enlarged prostate without lower urinary tract symptoms: Secondary | ICD-10-CM | POA: Diagnosis not present

## 2016-07-07 DIAGNOSIS — N138 Other obstructive and reflux uropathy: Secondary | ICD-10-CM | POA: Diagnosis present

## 2016-07-07 DIAGNOSIS — F0150 Vascular dementia without behavioral disturbance: Secondary | ICD-10-CM | POA: Diagnosis present

## 2016-07-07 DIAGNOSIS — Z87440 Personal history of urinary (tract) infections: Secondary | ICD-10-CM | POA: Diagnosis not present

## 2016-07-07 DIAGNOSIS — Z8673 Personal history of transient ischemic attack (TIA), and cerebral infarction without residual deficits: Secondary | ICD-10-CM | POA: Diagnosis not present

## 2016-07-07 DIAGNOSIS — A419 Sepsis, unspecified organism: Secondary | ICD-10-CM | POA: Diagnosis not present

## 2016-07-07 DIAGNOSIS — Z794 Long term (current) use of insulin: Secondary | ICD-10-CM | POA: Diagnosis not present

## 2016-07-07 DIAGNOSIS — R509 Fever, unspecified: Secondary | ICD-10-CM | POA: Diagnosis not present

## 2016-07-07 DIAGNOSIS — J159 Unspecified bacterial pneumonia: Secondary | ICD-10-CM

## 2016-07-07 DIAGNOSIS — R339 Retention of urine, unspecified: Secondary | ICD-10-CM | POA: Diagnosis not present

## 2016-07-07 DIAGNOSIS — R296 Repeated falls: Secondary | ICD-10-CM | POA: Diagnosis not present

## 2016-07-07 DIAGNOSIS — E1122 Type 2 diabetes mellitus with diabetic chronic kidney disease: Secondary | ICD-10-CM | POA: Diagnosis not present

## 2016-07-07 DIAGNOSIS — K219 Gastro-esophageal reflux disease without esophagitis: Secondary | ICD-10-CM | POA: Diagnosis not present

## 2016-07-07 DIAGNOSIS — N12 Tubulo-interstitial nephritis, not specified as acute or chronic: Secondary | ICD-10-CM | POA: Diagnosis not present

## 2016-07-07 DIAGNOSIS — E785 Hyperlipidemia, unspecified: Secondary | ICD-10-CM | POA: Diagnosis not present

## 2016-07-07 DIAGNOSIS — G9340 Encephalopathy, unspecified: Secondary | ICD-10-CM | POA: Diagnosis not present

## 2016-07-07 DIAGNOSIS — Y95 Nosocomial condition: Secondary | ICD-10-CM | POA: Diagnosis present

## 2016-07-07 DIAGNOSIS — R5081 Fever presenting with conditions classified elsewhere: Secondary | ICD-10-CM | POA: Diagnosis not present

## 2016-07-07 DIAGNOSIS — Z87891 Personal history of nicotine dependence: Secondary | ICD-10-CM | POA: Diagnosis not present

## 2016-07-07 DIAGNOSIS — F329 Major depressive disorder, single episode, unspecified: Secondary | ICD-10-CM | POA: Diagnosis present

## 2016-07-07 DIAGNOSIS — R338 Other retention of urine: Secondary | ICD-10-CM | POA: Diagnosis present

## 2016-07-07 DIAGNOSIS — J9600 Acute respiratory failure, unspecified whether with hypoxia or hypercapnia: Secondary | ICD-10-CM | POA: Diagnosis not present

## 2016-07-07 DIAGNOSIS — Z7982 Long term (current) use of aspirin: Secondary | ICD-10-CM | POA: Diagnosis not present

## 2016-07-07 DIAGNOSIS — Z79899 Other long term (current) drug therapy: Secondary | ICD-10-CM | POA: Diagnosis not present

## 2016-07-07 DIAGNOSIS — N319 Neuromuscular dysfunction of bladder, unspecified: Secondary | ICD-10-CM | POA: Diagnosis not present

## 2016-07-07 DIAGNOSIS — N189 Chronic kidney disease, unspecified: Secondary | ICD-10-CM | POA: Diagnosis not present

## 2016-07-07 DIAGNOSIS — I10 Essential (primary) hypertension: Secondary | ICD-10-CM | POA: Diagnosis present

## 2016-07-07 DIAGNOSIS — Z593 Problems related to living in residential institution: Secondary | ICD-10-CM | POA: Diagnosis not present

## 2016-07-07 DIAGNOSIS — N312 Flaccid neuropathic bladder, not elsewhere classified: Secondary | ICD-10-CM | POA: Diagnosis present

## 2016-07-07 LAB — CBC
HEMATOCRIT: 36.3 % — AB (ref 39.0–52.0)
HEMOGLOBIN: 11.8 g/dL — AB (ref 13.0–17.0)
MCH: 27.5 pg (ref 26.0–34.0)
MCHC: 32.5 g/dL (ref 30.0–36.0)
MCV: 84.6 fL (ref 78.0–100.0)
Platelets: 212 10*3/uL (ref 150–400)
RBC: 4.29 MIL/uL (ref 4.22–5.81)
RDW: 15.7 % — ABNORMAL HIGH (ref 11.5–15.5)
WBC: 5.5 10*3/uL (ref 4.0–10.5)

## 2016-07-07 LAB — GLUCOSE, CAPILLARY
GLUCOSE-CAPILLARY: 150 mg/dL — AB (ref 65–99)
GLUCOSE-CAPILLARY: 197 mg/dL — AB (ref 65–99)
Glucose-Capillary: 181 mg/dL — ABNORMAL HIGH (ref 65–99)
Glucose-Capillary: 146 mg/dL — ABNORMAL HIGH (ref 65–99)
Glucose-Capillary: 146 mg/dL — ABNORMAL HIGH (ref 65–99)
Glucose-Capillary: 271 mg/dL — ABNORMAL HIGH (ref 65–99)

## 2016-07-07 LAB — BASIC METABOLIC PANEL
ANION GAP: 7 (ref 5–15)
BUN: 7 mg/dL (ref 6–20)
CALCIUM: 8.7 mg/dL — AB (ref 8.9–10.3)
CO2: 26 mmol/L (ref 22–32)
Chloride: 110 mmol/L (ref 101–111)
Creatinine, Ser: 0.93 mg/dL (ref 0.61–1.24)
GFR calc Af Amer: >60 mL/min (ref >60)
GFR calc non Af Amer: >60 mL/min (ref >60)
GLUCOSE: 143 mg/dL — AB (ref 65–99)
POTASSIUM: 3.7 mmol/L (ref 3.5–5.1)
Sodium: 143 mmol/L (ref 135–145)

## 2016-07-07 LAB — INFLUENZA PANEL BY PCR (TYPE A & B)
INFLBPCR: NEGATIVE
Influenza A By PCR: NEGATIVE

## 2016-07-07 LAB — I-STAT CG4 LACTIC ACID, ED: Lactic Acid, Venous: 1.28 mmol/L (ref 0.5–1.9)

## 2016-07-07 LAB — VALPROIC ACID LEVEL: Valproic Acid Lvl: 36 ug/mL — ABNORMAL LOW (ref 50.0–100.0)

## 2016-07-07 MED ORDER — FINASTERIDE 5 MG PO TABS
5.0000 mg | ORAL_TABLET | Freq: Every day | ORAL | Status: DC
  Administered 2016-07-07 – 2016-07-09 (×3): 5 mg via ORAL
  Filled 2016-07-07 (×3): qty 1

## 2016-07-07 MED ORDER — INSULIN GLARGINE 100 UNIT/ML ~~LOC~~ SOLN
20.0000 [IU] | Freq: Every day | SUBCUTANEOUS | Status: DC
  Administered 2016-07-08: 20 [IU] via SUBCUTANEOUS
  Filled 2016-07-07: qty 0.2

## 2016-07-07 MED ORDER — ACETAMINOPHEN 650 MG RE SUPP: 650.0000 mg | Freq: Four times a day (QID) | RECTAL | Status: DC | PRN

## 2016-07-07 MED ORDER — DIVALPROEX SODIUM 500 MG PO DR TAB
500.0000 mg | DELAYED_RELEASE_TABLET | Freq: Two times a day (BID) | ORAL | Status: DC
  Administered 2016-07-07 – 2016-07-09 (×6): 500 mg via ORAL
  Filled 2016-07-07 (×6): qty 1

## 2016-07-07 MED ORDER — VITAMIN C 500 MG PO TABS
1000.0000 mg | ORAL_TABLET | Freq: Two times a day (BID) | ORAL | Status: DC
  Administered 2016-07-07 – 2016-07-09 (×5): 1000 mg via ORAL
  Filled 2016-07-07 (×5): qty 2

## 2016-07-07 MED ORDER — LISINOPRIL 5 MG PO TABS
5.0000 mg | ORAL_TABLET | Freq: Every morning | ORAL | Status: DC
  Administered 2016-07-07 – 2016-07-09 (×3): 5 mg via ORAL
  Filled 2016-07-07 (×3): qty 1

## 2016-07-07 MED ORDER — PIPERACILLIN-TAZOBACTAM 3.375 G IVPB 30 MIN: 3.3750 g | Freq: Once | INTRAVENOUS | Status: DC

## 2016-07-07 MED ORDER — MAGNESIUM HYDROXIDE 400 MG/5ML PO SUSP: 30.0000 mL | Freq: Every day | ORAL | Status: DC | PRN

## 2016-07-07 MED ORDER — TAMSULOSIN HCL 0.4 MG PO CAPS
0.8000 mg | ORAL_CAPSULE | Freq: Every day | ORAL | Status: DC
  Administered 2016-07-07 – 2016-07-09 (×3): 0.8 mg via ORAL
  Filled 2016-07-07 (×3): qty 2

## 2016-07-07 MED ORDER — ACETAMINOPHEN 325 MG PO TABS
650.0000 mg | ORAL_TABLET | Freq: Four times a day (QID) | ORAL | Status: DC | PRN
  Administered 2016-07-09: 650 mg via ORAL
  Filled 2016-07-07: qty 2

## 2016-07-07 MED ORDER — VENLAFAXINE HCL ER 75 MG PO CP24
75.0000 mg | ORAL_CAPSULE | Freq: Every day | ORAL | Status: DC
  Administered 2016-07-07 – 2016-07-09 (×3): 75 mg via ORAL
  Filled 2016-07-07 (×3): qty 1

## 2016-07-07 MED ORDER — PIPERACILLIN-TAZOBACTAM 3.375 G IVPB
3.3750 g | Freq: Three times a day (TID) | INTRAVENOUS | Status: DC
  Administered 2016-07-07 – 2016-07-08 (×4): 3.375 g via INTRAVENOUS
  Filled 2016-07-07 (×5): qty 50

## 2016-07-07 MED ORDER — ATORVASTATIN CALCIUM 40 MG PO TABS
40.0000 mg | ORAL_TABLET | Freq: Every day | ORAL | Status: DC
  Administered 2016-07-07 – 2016-07-08 (×2): 40 mg via ORAL
  Filled 2016-07-07 (×2): qty 1

## 2016-07-07 MED ORDER — PANTOPRAZOLE SODIUM 20 MG PO TBEC
20.0000 mg | DELAYED_RELEASE_TABLET | Freq: Every day | ORAL | Status: DC
  Administered 2016-07-07 – 2016-07-09 (×3): 20 mg via ORAL
  Filled 2016-07-07 (×3): qty 1

## 2016-07-07 MED ORDER — INSULIN ASPART 100 UNIT/ML ~~LOC~~ SOLN
0.0000 [IU] | Freq: Three times a day (TID) | SUBCUTANEOUS | Status: DC
  Administered 2016-07-07: 5 [IU] via SUBCUTANEOUS
  Administered 2016-07-07: 1 [IU] via SUBCUTANEOUS
  Administered 2016-07-08 (×2): 2 [IU] via SUBCUTANEOUS
  Administered 2016-07-08 – 2016-07-09 (×2): 5 [IU] via SUBCUTANEOUS
  Administered 2016-07-09: 2 [IU] via SUBCUTANEOUS

## 2016-07-07 MED ORDER — ENOXAPARIN SODIUM 40 MG/0.4ML ~~LOC~~ SOLN
40.0000 mg | SUBCUTANEOUS | Status: DC
  Administered 2016-07-07 – 2016-07-09 (×3): 40 mg via SUBCUTANEOUS
  Filled 2016-07-07 (×3): qty 0.4

## 2016-07-07 MED ORDER — BISACODYL 10 MG RE SUPP: 10.0000 mg | RECTAL | Status: DC | PRN

## 2016-07-07 MED ORDER — VANCOMYCIN HCL IN DEXTROSE 1-5 GM/200ML-% IV SOLN
1000.0000 mg | Freq: Two times a day (BID) | INTRAVENOUS | Status: DC
  Administered 2016-07-07 – 2016-07-08 (×3): 1000 mg via INTRAVENOUS
  Filled 2016-07-07 (×3): qty 200

## 2016-07-07 MED ORDER — VANCOMYCIN HCL IN DEXTROSE 1-5 GM/200ML-% IV SOLN: 1000.0000 mg | Freq: Once | INTRAVENOUS | Status: DC

## 2016-07-07 MED ORDER — OSELTAMIVIR PHOSPHATE 75 MG PO CAPS
75.0000 mg | ORAL_CAPSULE | Freq: Two times a day (BID) | ORAL | Status: DC
  Filled 2016-07-07: qty 1

## 2016-07-07 MED ORDER — ASPIRIN 81 MG PO CHEW
81.0000 mg | CHEWABLE_TABLET | Freq: Every day | ORAL | Status: DC
  Administered 2016-07-07 – 2016-07-09 (×3): 81 mg via ORAL
  Filled 2016-07-07 (×3): qty 1

## 2016-07-07 MED ORDER — INSULIN ASPART 100 UNIT/ML ~~LOC~~ SOLN
0.0000 [IU] | SUBCUTANEOUS | Status: DC
  Administered 2016-07-07 (×2): 1 [IU] via SUBCUTANEOUS
  Administered 2016-07-07: 3 [IU] via SUBCUTANEOUS

## 2016-07-07 MED ORDER — CRANBERRY 1000 MG PO CAPS: 1000.0000 mg | ORAL_CAPSULE | Freq: Three times a day (TID) | ORAL | Status: DC

## 2016-07-07 MED ORDER — SALINE SPRAY 0.65 % NA SOLN: 2.0000 | Freq: Two times a day (BID) | NASAL | Status: DC | PRN

## 2016-07-07 MED ORDER — ONDANSETRON 4 MG PO TBDP: 4.0000 mg | ORAL_TABLET | Freq: Three times a day (TID) | ORAL | Status: DC | PRN

## 2016-07-07 MED ORDER — SENNA 8.6 MG PO TABS
2.0000 | ORAL_TABLET | Freq: Every day | ORAL | Status: DC
  Administered 2016-07-07 – 2016-07-08 (×2): 17.2 mg via ORAL
  Filled 2016-07-07 (×2): qty 2

## 2016-07-07 MED ORDER — OLOPATADINE HCL 0.1 % OP SOLN
1.0000 [drp] | Freq: Two times a day (BID) | OPHTHALMIC | Status: DC
  Administered 2016-07-07 – 2016-07-09 (×5): 1 [drp] via OPHTHALMIC
  Filled 2016-07-07: qty 5

## 2016-07-07 NOTE — Progress Notes (Signed)
Patient arrived to floor from ED. Wife at bedside. Report received from [place], [place].

## 2016-07-07 NOTE — Evaluation (Signed)
Clinical/Bedside Swallow Evaluation Patient Details  Name: Reginald Patterson MRN: [id number] Date of Birth: 1940-06-14  Today's Date: 07/07/2016 Time: SLP Start Time (ACUTE ONLY): [id number] SLP Stop Time (ACUTE ONLY): 0841 SLP Time Calculation (min) (ACUTE ONLY): 12 min  Past Medical History:  Past Medical History:  Diagnosis Date  . Acute encephalopathy 06/16/2015  . Agitation 02/20/2015  . Anemia 11-08-12   mild anemia  . Anxiety   . Anxiety state 06/22/2015  . Chronic kidney disease    must self cath  . Colon polyps   . Constipation 06/27/2016  . CVA (cerebral infarction) 10/02/2011  . Dementia 06/22/2015  . Depression 10/04/2011  . Diabetes mellitus, type 2 ([place]) 10/02/2011  . Encephalopathy acute 06/17/2015  . Falls frequently 06/22/2015  . Fecal incontinence   . Fracture of rib of right side 06/15/2015   8th right posterior rib fracture   . HTN (hypertension) 10/02/2011  . Hyperlipidemia   . Hypertension   . Incontinence 06/22/2015  . Infection of urinary tract 07/25/2015  . Leg weakness 02/20/2015  . 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 Stroke [facility]) 11-08-12   x2 , last 09-29-11(confusion,left side weakness) no residual"some memory problems.  Marland Kitchen UTI (lower urinary tract infection) 06/16/15; 07/25/14  . Weight loss, unintentional 07/25/2015   Past Surgical History:  Past Surgical History:  Procedure Laterality Date  . [place]- metal plate and screws for degenerative changes spine  . COLONOSCOPY WITH PROPOFOL N/A 01/12/2013   Procedure: COLONOSCOPY WITH PROPOFOL;  Surgeon: Arta Silence, MD;  Location: [facility];  Service: Endoscopy;  Laterality: N/A;   HPI:  Ptis a 76 y.o.malepresenting with AMS and fever. PMH is significant for previous CVAs, T2DM, hypertension, hyperlipidemia, dementia, kidney disease, and recurrent UTIs. CXR showed no active disease, minimal aortic atherosclerosis. CT of head showed no acute intracranial abnormalities. Chronic atrophy and small vessel  ischemic changes and bilateral mastoid effusions.   Assessment / Plan / Recommendation Clinical Impression  Reginald Patterson was drowsy and required max verbal, tactile, and visual cues to attend to directions and POs. RN and wife did not have major concerns regarding his swallowing abilities; RN reported that pt did not have any difficulties with medications the prior night. A full oral motor assessment was unable to be completed due to pt's mentation. No overt s/s of aspiration noted at bedside. Prolonged mastication and poor awareness of bolus during regular solids, likely due to current state of decreased awareness, confusion, and drowsiness. Recommended diet of thin liquids (straws ok), Dys 1 pureed solids (due to prolonged mastication), and meds crushed. Wife reported pt on purees recently and able to upgrade to solids. Prognosis for return to regular texture is good once mentation improves. ST will f/u briefly to assess diet tolerance, possible diet upgrade, and safety/efficiency of swallow mechanism.    Aspiration Risk   (mild-mod)    Diet Recommendation Dysphagia 1 (Puree);Thin liquid   Liquid Administration via: Cup;Straw Medication Administration: Crushed with puree Supervision: Staff to assist with self feeding;Intermittent supervision to cue for compensatory strategies Compensations: Small sips/bites;Slow rate;Minimize environmental distractions;Lingual sweep for clearance of pocketing Postural Changes: Seated upright at 90 degrees    Other  Recommendations Oral Care Recommendations: Oral care BID   Follow up Recommendations Other (comment);Skilled Nursing facility      Frequency and Duration min 2x/week  2 weeks       Prognosis Prognosis for Safe Diet Advancement: Good Barriers to Reach Goals: Cognitive deficits  Swallow Study   General HPI: Ptis a 76 y.o.malepresenting with AMS and fever. PMH is significant for previous CVAs, T2DM, hypertension, hyperlipidemia,  dementia, kidney disease, and recurrent UTIs. CXR showed no active disease, minimal aortic atherosclerosis. CT of head showed no acute intracranial abnormalities. Chronic atrophy and small vessel ischemic changes and bilateral mastoid effusions. Type of Study: Bedside Swallow Evaluation Previous Swallow Assessment:  (none) Diet Prior to this Study: NPO Temperature Spikes Noted: No Respiratory Status: Nasal cannula History of Recent Intubation: No Behavior/Cognition: Confused;Lethargic/Drowsy Oral Cavity Assessment: Other (comment) (unable to assess due to mentation) Oral Care Completed by SLP: No Oral Cavity - Dentition:  (unable to assess due to mentation) Vision: Functional for self-feeding Self-Feeding Abilities: Needs assist;Needs set up Patient Positioning: Upright in bed Baseline Vocal Quality: Not observed Volitional Cough: Cognitively unable to elicit Volitional Swallow: Unable to elicit    Oral/Motor/Sensory Function Overall Oral Motor/Sensory Function: Other (comment) (unable to assess due to mentation-appeared [facility])   Ice Chips Ice chips: Not tested   Thin Liquid Thin Liquid: Within functional limits Presentation: Straw    Nectar Thick Nectar Thick Liquid: Not tested   Honey Thick Honey Thick Liquid: Not tested   Puree Puree: Impaired Presentation: Spoon Oral Phase Impairments: Poor awareness of bolus Oral Phase Functional Implications: Prolonged oral transit Pharyngeal Phase Impairments: Other (comments) (none)   Solid   GO   Solid: Impaired Presentation: Self Fed Oral Phase Impairments: Other (comment) (prolonged mastication) Oral Phase Functional Implications: Impaired mastication;Other (comment) (prolonged mastication) Pharyngeal Phase Impairments: Other (comments) (none)        Reginald Patterson , Student-SLP 07/07/2016,9:29 AM

## 2016-07-07 NOTE — Progress Notes (Signed)
  FPTS Interim Progress Note  S: Mr. Arseneau is sleepy but will nod yes or no to questions. Does not open eyes. Denies pain. Wife in room, she is concerned about him being so tired. Reports he had a difficult night, has been coughing and upset.   O: BP 129/69 (BP Location: Left Arm)   Pulse (!) 55   Temp 98.8 F (37.1 C) (Axillary)   Resp 17   Ht 5\' 7"  (1.702 m)   Wt 181 lb 3.5 oz (82.2 kg)   SpO2 98%   BMI 28.38 kg/m    Gen: 76 year old in NAD, sleepy but arouses to voice Heart: RRR no MRG Lungs: no increased work of breathing, right lower lung with crackles Abdomen: soft, non-tender, non-distended, +BS Extremities: no edema or cyanosis  WBC: 5.9 > 5.5  A/P:  AMS- improved slightly in ED with fluid boluses, but this morning back to being altered per wife. Per wife has been having flu like symptoms.  -continue broad spectrum ABX -cultures pending, narrow abx as able -continue fluids -monitor neuro status -order flu swab, droplet precautions  Urinary Retention- likely UTI source for AMS - Continue home finasteride 5 mg, tamsulosin 0.8 mg daily - Order I&O cath twice per shift while patient still confused - Continue cranberry and vitamin C supplements  T2DM- patient passed swallow study. CBG this am 146. -CBG AC/HS -sensitive SSI  HTN- on lisinopril 5mg  daily, BP 129/69 this morning -monitor, continue home medications   Steve Rattler, DO 07/07/2016, 7:34 AM PGY-1, [facility] pager [phone]

## 2016-07-07 NOTE — Progress Notes (Signed)
Pharmacy Antibiotic Note  Reginald Patterson is a 76 y.o. male admitted from [facility] on 07/06/2016 with sepsis. Noted pt taking Keflex PTA for UTI since 2/6. Pharmacy has been consulted for Vancomycin and Zosyn dosing.  Vancomycin 1gm and Zosyn 3.375gm given in ED ~2300  Plan: Zosyn 3.375gm IV q8h - doses over 4 hours Vancomycin 1gm Iv q12h Will f/u micro data, renal function, and pt's clinical condition Vanc trough prn   Height: 5\' 7"  (170.2 cm) Weight: 182 lb (82.6 kg) IBW/kg (Calculated) : 66.1  Temp (24hrs), Avg:99.5 F (37.5 C), Min:98.3 F (36.8 C), Max:100.6 F (38.1 C)   Recent Labs Lab 07/06/16 2137 07/06/16 2200 07/07/16 0125  WBC 5.9  --   --   CREATININE 1.00  --   --   LATICACIDVEN  --  1.94* 1.28    Estimated Creatinine Clearance: 65.6 mL/min (by C-G formula based on SCr of 1 mg/dL).    Allergies  Allergen Reactions  . Ciprofloxacin Rash  . Vicodin [Hydrocodone-Acetaminophen] Rash    Antimicrobials this admission: 2/11 Vanc >>  2/11 Zosyn >>   Dose adjustments this admission: n/a  Microbiology results: 2/11 BCx x2:  2/11 UCx:    Thank you for allowing pharmacy to be a part of this patient's care.  Sherlon Handing, PharmD, BCPS Clinical pharmacist, pager [phone]  07/07/2016 1:40 AM

## 2016-07-07 NOTE — H&P (Signed)
[facility] Admission History and Physical Service Pager: [phone]  Patient name: Reginald Patterson Medical record number: [id number] Date of birth: Jun 14, 1940 Age: 76 y.o. Gender: male  Primary Care Provider: Gennette Pac, MD Consultants: None Code Status: Full (confirmed on admission)  Chief Complaint: AMS  Assessment and Plan: Reginald Patterson is a 76 y.o. male presenting with AMS and fever. PMH is significant for h/o CVAs, T2DM, hypertension, hyperlipidemia, dementia, kidney disease, and recurrent UTIs.   AMS: Concern for urosepsis in setting of recently beginning treatment for UTI and history of recurrent UTIs. qSofa of 2 for AMS and RR 22. Improved with IV fluid boluses in ED, control of fever, and initiation of broad spectrum antibiotics. No leukocytosis with WBC of 5.9. Patient typically has pan-sensitive E coli UTIs but most recent urine culture available on Epic from 02/2016 shows Klebsiella pneumoniae UTI resistant to ampicillin. UA in ED showed large leukocytes, pyuria, glucose. Fever to 100.6 F in ED and 101.9 F at [place] shortly before presentation. Meets McGeer criteria with fever and suprapubic pain. CXR with minimal atelectasis. CT head obtained for report of fall when transferring from wheelchair the day before presentation; this was negative for acute intracranial abnormality but did show inflammatory changes in paranasal sinuses and bilateral mastoid effusions. Glucose elevated to 236, no anion gap. Patient typically is oriented to self and often place. Has good long-term memory, per wife, and will joke about politics when he is well.  - Admit to Inpatient med-surg, attending Dr. McDiarmid - s/p Vanc and Zosyn in the ED - Would continue broad spectrum antibiotics until blood and urine cultures return - Follow-up blood and urine cultures - Perform otoscopic exam - Continue to assess neuro status - Swallow study to ensure diet is safe -  PT/OT prior to discharge  Urinary retention: Noted to have neurogenic bladder, BPH. Actually was spilling urine with large volume of IVFs administered in ED, so condom cath temporarily placed. Takes vitamin C and cranberry supplements at SNF to try to prevent UTIs. Self-caths twice per shift, per wife.  - Continue home finasteride 5 mg, tamsulosin 0.8 mg daily - Order I&O cath twice per shift while patient still confused - Continue cranberry and vitamin C supplements  T2DM: Takes 35 U lantus nightly.  - Ordered for 20 U to begin night of 2/12, hoping patient is eating at that time - Sensitive SSI q4h with CBGs while NPO  Depression/Mood instability: Son reports this is somewhat attributable to strokes.  - Continue home effexor 75 mg daily, depakote 500 mg BID  HLD: - Continue home lipitor 40 mg daily  History of CVA: - Continue daily aspirin, statin, antihypertensives  HTN: BP to 171/76 on admission - Continue home lisinopril 5 mg daily   FEN/GI: NPO until swallow eval, then carb-modified/heart healthy, protonix Prophylaxis: Lovenox  Disposition: [facility] once improved  History of Present Illness:  Reginald Patterson is a 76 y.o. male presenting with AMS. On 2/8, patient was started on keflex, as he was complaining of dysuria. He has had numerous UTIs. He self-caths for neurogenic bladder. His wife notes he was having trouble holding conversation/answering questions, saying "huh" to her most of the day before yesterday. However, he was still participating in normal SNF activities like going to cafeteria, which is atypical of his normal UTI presentation when he just stays in bed. Sunday, he was sleepier and was only oriented to self, which is more consistent with how he  acts when sick. [facility] Nurse called this evening and reported fever to 101.9 F, hypertension to 199/88 and occasional cough. She also reported that he fell yesterday but unknown if he hit his head. Transferred to ED  for further assessment.    In the ED, patient was treated for sepsis with 2.5 L IVFs and broad spectrum abx, given mild lactic acidosis of 1.94, slightly increased RR of 22, and fever with suspected source (dirty UA). CXR negative. EKG sinus rhythm. CT head negative for acute process.   Review Of Systems: Per HPI with the following additions:  Review of Systems  Constitutional: Positive for chills and fever.  HENT: Negative for congestion and sore throat.   Eyes: Negative for pain.  Respiratory: Negative for sputum production and shortness of breath.   Cardiovascular: Negative for chest pain and leg swelling.  Gastrointestinal: Negative for diarrhea, nausea and vomiting.  Genitourinary: Negative for dysuria and urgency.  Musculoskeletal: Positive for falls. Negative for myalgias.  Skin: Negative for itching and rash.  Neurological: Positive for weakness. Negative for focal weakness and headaches.    Patient Active Problem List   Diagnosis Date Noted  . UTI (urinary tract infection) 07/07/2016  . Sepsis ([facility]) 07/07/2016  . Constipation 06/27/2016  . Neurogenic bladder, flaccid   . Complicated UTI (urinary tract infection) 08/20/2015  . Colon polyps   . 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 Falls frequently 06/22/2015  . Chronic low back pain 11/24/2014  . Anemia 10/04/2011  . Depression 10/04/2011  . CVA (cerebral infarction) 10/02/2011  . HTN (hypertension) 10/02/2011  . Hyperlipidemia 10/02/2011  . Diabetes mellitus, type 2 ([facility]) 10/02/2011    Past Medical History: Past Medical History:  Diagnosis Date  . Acute encephalopathy 06/16/2015  . Agitation 02/20/2015  . Anemia 11-08-12   mild anemia  . Anxiety   . Anxiety state 06/22/2015  . Chronic kidney disease    must self cath  . Colon polyps   . Constipation 06/27/2016  . CVA (cerebral infarction) 10/02/2011  . Dementia  06/22/2015  . Depression 10/04/2011  . Diabetes mellitus, type 2 ([place]) 10/02/2011  . Encephalopathy acute 06/17/2015  . Falls frequently 06/22/2015  . Fecal incontinence   . Fracture of rib of right side 06/15/2015   8th right posterior rib fracture   . HTN (hypertension) 10/02/2011  . Hyperlipidemia   . Hypertension   . Incontinence 06/22/2015  . Infection of urinary tract 07/25/2015  . Leg weakness 02/20/2015  . 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 Stroke [facility]) 11-08-12   x2 , last 09-29-11(confusion,left side weakness) no residual"some memory problems.  Marland Kitchen UTI (lower urinary tract infection) 06/16/15; 07/25/14  . Weight loss, unintentional 07/25/2015    Past Surgical History: Past Surgical History:  Procedure Laterality Date  . [place]- metal plate and screws for degenerative changes spine  . COLONOSCOPY WITH PROPOFOL N/A 01/12/2013   Procedure: COLONOSCOPY WITH PROPOFOL;  Surgeon: Arta Silence, MD;  Location: [facility] ENDOSCOPY;  Service: Endoscopy;  Laterality: N/A;    Social History: Social History  Substance Use Topics  . Smoking status: Former Smoker    Packs/day: 1.00    Years: 5.00    Types: Cigarettes  . Smokeless tobacco: Former Systems developer    Types: Chew    Quit date: 05/26/1962  . Alcohol use Yes     Comment: only rarely   Additional social history: Has  been at [facility] for a little over 1 year. Please also refer to relevant sections of EMR.  Family History: Family History  Problem Relation Age of Onset  . Dementia Mother   . Alzheimer's disease Mother   . Stroke Mother   . Lung cancer Father   . Colon cancer Maternal Grandmother     Allergies and Medications: Allergies  Allergen Reactions  . Ciprofloxacin Rash  . Vicodin [Hydrocodone-Acetaminophen] Rash   No current facility-administered medications on file prior to encounter.    Current Outpatient Prescriptions on File Prior to Encounter  Medication Sig Dispense Refill  . Ascorbic Acid (VITAMIN C) 1000  MG tablet Take 1 tablet (1,000 mg total) by mouth 2 (two) times daily.    Marland Kitchen aspirin 81 MG chewable tablet Chew 81 mg by mouth daily.    Marland Kitchen atorvastatin (LIPITOR) 40 MG tablet Take 40 mg by mouth at bedtime.    . Calcium Citrate-Vitamin D (CALCIUM CITRATE + D PO) Take 1,000 mg by mouth daily. For low Ca levels    . cephALEXin (KEFLEX) 500 MG capsule Take 1 capsule (500 mg total) by mouth 4 (four) times daily. x10 days 40 capsule 0  . Cranberry 1000 MG CAPS Take 1 capsule (1,000 mg total) by mouth 3 (three) times daily. 30 each   . finasteride (PROSCAR) 5 MG tablet Take 5 mg by mouth daily.    Marland Kitchen ibuprofen (ADVIL,MOTRIN) 200 MG tablet Take 200 mg by mouth every 6 (six) hours as needed for pain. Reported on 08/20/2015    . Insulin Glargine (LANTUS SOLOSTAR) 100 UNIT/ML SOPN Inject 35 Units into the skin daily at 10 pm.     . lisinopril (PRINIVIL,ZESTRIL) 5 MG tablet Take 5 mg by mouth every morning.    . Multiple Vitamin (MULTIVITAMIN WITH MINERALS) TABS Take 1 tablet by mouth daily.    . Olopatadine HCl 0.2 % SOLN Place 1 drop into both eyes daily.    . pantoprazole (PROTONIX) 20 MG tablet Take 20 mg by mouth daily.    Marland Kitchen senna (SENOKOT) 8.6 MG TABS tablet Take 2 tablets by mouth at bedtime.    . tamsulosin (FLOMAX) 0.4 MG CAPS capsule Take 0.8 mg by mouth daily.    Marland Kitchen venlafaxine XR (EFFEXOR-XR) 75 MG 24 hr capsule Take 75 mg by mouth daily.    . Wheat Dextrin (BENEFIBER PO) Take 7.5 mLs by mouth daily. Reported on 08/17/2015    . divalproex (DEPAKOTE ER) 250 MG 24 hr tablet Take 2 tablets (500 mg total) by mouth 2 (two) times daily. (Patient not taking: Reported on 07/06/2016) 60 tablet 0  . methenamine (HIPREX) 1 g tablet Take 1 tablet (1 g total) by mouth 2 (two) times daily with a meal. (Patient not taking: Reported on 07/06/2016)      Objective: BP 127/76 (BP Location: Left Arm)   Pulse (!) 57   Temp 98.1 F (36.7 C) (Oral)   Resp 19   Ht 5\' 7"  (1.702 m)   Wt 181 lb 3.5 oz (82.2 kg)   SpO2  98%   BMI 28.38 kg/m  Exam: General: Older male, resting in bed, appears non-toxic Eyes: Watery eyes without conjunctivitis. PERRLA. ENTM: MM tacky. Posterior oropharynx normal. Neck: Supple, FROM. Cardiovascular: RRR, S1, S2, no m/r/g Respiratory: CTAB, no crackles or wheezes, Trooper in place for comfort Gastrointestinal: Tympanic abdomen, soft, +BS, no rebound or guarding, mild TTP of lower abdomen MSK: No LE edema, strength of lower and upper extremities intact, able  to turn himself to the side of the bed for lung exam Derm: Large seborrheic keratosis on back of head. No bruising on face or head.  Neuro: Oriented to person, and with much prompting from his wife place. Alert and making jokes but not always able to answer questions directly. Psych: Normal mood and affect.   Labs and Imaging: CBC BMET   Recent Labs Lab 07/06/16 2137  WBC 5.9  HGB 12.0*  HCT 36.4*  PLT 230    Recent Labs Lab 07/06/16 2137  NA 136  K 3.9  CL 104  CO2 24  BUN 10  CREATININE 1.00  GLUCOSE 236*  CALCIUM 9.3     Ct Head Wo Contrast  Result Date: 07/06/2016 CLINICAL DATA:  Septic cough. History of stroke, hypertension, acute encephalopathy, diabetes, dementia, and stroke. EXAM: CT HEAD WITHOUT CONTRAST TECHNIQUE: Contiguous axial images were obtained from the base of the skull through the vertex without intravenous contrast. COMPARISON:  03/22/2016 FINDINGS: Brain: No evidence of acute infarction, hemorrhage, hydrocephalus, extra-axial collection or mass lesion/mass effect. Mild cerebral atrophy. Patchy low-attenuation changes in the deep white matter consistent with small vessel ischemia. Vascular: Vascular calcifications are present in the carotid siphons. Skull: Calvarium appears intact. Sinuses/Orbits: Mucosal thickening throughout the paranasal sinuses with opacification of some of the ethmoid air cells. No acute air-fluid levels. Mastoid air cells are hypoaerated but there are bilateral small  mastoid effusions within the aerated portions. Other: Paranasal sinus disease is progressing since previous study. IMPRESSION: No acute intracranial abnormalities. Chronic atrophy and small vessel ischemic changes. Progressing inflammatory changes in the paranasal sinuses since previous study. Bilateral mastoid effusions. Electronically Signed   By: Lucienne Capers M.D.   On: 07/06/2016 22:52   Dg Chest Port 1 View  Result Date: 07/06/2016 CLINICAL DATA:  Urinary tract infection.  Nonproductive cough. EXAM: PORTABLE CHEST 1 VIEW COMPARISON:  None. FINDINGS: Heart is normal in size. There is aortic atherosclerosis with slight uncoiling of the thoracic aorta. There is minimal atelectasis at the right lung base. No pulmonary consolidation or overt pulmonary edema. No acute nor suspicious osseous abnormality. IMPRESSION: No active disease. Electronically Signed   By: Ashley Royalty M.D.   On: 07/06/2016 22:23    Rogue Bussing, MD 07/07/2016, 2:56 AM PGY-2, [place] Intern pager: [phone], text pages welcome

## 2016-07-08 ENCOUNTER — Encounter (HOSPITAL_COMMUNITY): Payer: Self-pay | Admitting: Family Medicine

## 2016-07-08 ENCOUNTER — Inpatient Hospital Stay (HOSPITAL_COMMUNITY): Payer: Medicare Other

## 2016-07-08 DIAGNOSIS — J159 Unspecified bacterial pneumonia: Secondary | ICD-10-CM

## 2016-07-08 DIAGNOSIS — R509 Fever, unspecified: Secondary | ICD-10-CM

## 2016-07-08 HISTORY — DX: Unspecified bacterial pneumonia: J15.9

## 2016-07-08 LAB — URINE CULTURE: Culture: NO GROWTH

## 2016-07-08 LAB — GLUCOSE, CAPILLARY
GLUCOSE-CAPILLARY: 164 mg/dL — AB (ref 65–99)
GLUCOSE-CAPILLARY: 280 mg/dL — AB (ref 65–99)
Glucose-Capillary: 163 mg/dL — ABNORMAL HIGH (ref 65–99)
Glucose-Capillary: 165 mg/dL — ABNORMAL HIGH (ref 65–99)
Glucose-Capillary: 168 mg/dL — ABNORMAL HIGH (ref 65–99)

## 2016-07-08 MED ORDER — DOXYCYCLINE HYCLATE 100 MG PO TABS
100.0000 mg | ORAL_TABLET | Freq: Two times a day (BID) | ORAL | Status: DC
  Administered 2016-07-08 – 2016-07-09 (×2): 100 mg via ORAL
  Filled 2016-07-08 (×2): qty 1

## 2016-07-08 NOTE — Care Management Note (Signed)
Case Management Note  Patient Details  Name: Reginald Patterson MRN: [id number] Date of Birth: 28-Apr-1941  Subjective/Objective:                    Action/Plan:   Expected Discharge Date:                  Expected Discharge Plan:  Skilled Nursing Facility  In-House Referral:  Clinical Social Work  Discharge planning Services     Post Acute Care Choice:    Choice offered to:     DME Arranged:    DME Agency:     HH Arranged:    [place] Agency:     Status of Service:  In process, will continue to follow  If discussed at Long Length of Stay Meetings, dates discussed:    Additional Comments:  Marilu Favre, RN 07/08/2016, 10:31 AM

## 2016-07-08 NOTE — Discharge Summary (Signed)
[facility] Discharge Summary  Patient name: Reginald Patterson Medical record number: [id number] Date of birth: 12-04-1940 Age: 76 y.o. Gender: male Date of Admission: 07/06/2016  Date of Discharge: 07/09/16 Admitting Physician: Blane Ohara McDiarmid, MD  Primary Care Provider: Gennette Pac, MD Consultants: none   Indication for Hospitalization: AMS  Discharge Diagnoses/Problem List:  Patient Active Problem List   Diagnosis Date Noted  . Healthcare associated bacterial pneumonia 07/08/2016  . Febrile illness   . UTI (urinary tract infection) 07/07/2016  . Sepsis ([place]) 07/07/2016  . Pyelonephritis, presumed 07/07/2016  . Persistent fever   . Vascular dementia without behavioral disturbance   . Constipation 06/27/2016  . Neurogenic bladder, flaccid   . Complicated UTI (urinary tract infection) 08/20/2015  . Colon polyps   . 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 Falls frequently 06/22/2015  . Acute delirium 06/17/2015  . Acute encephalopathy 06/16/2015  . Chronic low back pain 11/24/2014  . Anemia 10/04/2011  . Depression 10/04/2011  . CVA (cerebral infarction) 10/02/2011  . HTN (hypertension) 10/02/2011  . Hyperlipidemia 10/02/2011  . Diabetes mellitus, type 2 ([place]) 10/02/2011    Disposition: back to nursing home, [facility]  Discharge Condition: stable, improved  Discharge Exam: see progress note from day of discharge  Brief Hospital Course:   Reginald Patterson is a 76 year old male with PMH of previous strokes, T2DM, HTN, HLD, dementia, CKD, and frequent UTI's who presented from his nursing facility to [facility] ED with altered mental status. He had been started on Keflex the week prior due to concern for UTI, however he continued to worsen despite oral antibiotics. In the ED, [name] was meeting sepsis criteria and was started on broad  spectrum antibiotics and fluid boluses per sepsis protocol. His mentation improved with fluids but returned to altered overnight. Head CT was negative for acute changes or bleed. Flu swab was negative. Urine culture was negative. Initial CXR was negative for signs of infection but clinically there was a concern for pneumonia. A repeat CXR revealed a RLL pneumonia. He was transitioned from broad spectrum IV antibiotics to oral doxycycline. He remained afebrile overnight and mental status was much improved. He was discharged back to [place] facility in stable and improved condition on 07/09/16.  Issues for Follow Up:  1. Monitor mental status 2. Continue doxycycline for 4 more days (total of 7 day course of antibiotics)  Significant Procedures: none  Significant Labs and Imaging:   Recent Labs Lab 07/06/16 2137 07/07/16 0649 07/09/16 0459  WBC 5.9 5.5 9.3  HGB 12.0* 11.8* 12.2*  HCT 36.4* 36.3* 37.8*  PLT 230 212 231    Recent Labs Lab 07/06/16 2137 07/07/16 0649 07/09/16 0459  NA 136 143 142  K 3.9 3.7 4.6  CL 104 110 105  CO2 24 26 30   GLUCOSE 236* 143* 153*  BUN 10 7 13   CREATININE 1.00 0.93 1.07  CALCIUM 9.3 8.7* 9.3  ALKPHOS 74  --   --   AST 17  --   --   ALT 14*  --   --   ALBUMIN 3.3*  --   --    Urine Culture no growth Flu negative  Ct Head Wo Contrast  Result Date: 07/06/2016 CLINICAL DATA:  Septic cough. History of stroke, hypertension, acute encephalopathy, diabetes, dementia, and stroke. EXAM: CT HEAD WITHOUT CONTRAST TECHNIQUE: Contiguous axial images were obtained from the  base of the skull through the vertex without intravenous contrast. COMPARISON:  03/22/2016 FINDINGS: Brain: No evidence of acute infarction, hemorrhage, hydrocephalus, extra-axial collection or mass lesion/mass effect. Mild cerebral atrophy. Patchy low-attenuation changes in the deep white matter consistent with small vessel ischemia. Vascular: Vascular calcifications are present  in the carotid siphons. Skull: Calvarium appears intact. Sinuses/Orbits: Mucosal thickening throughout the paranasal sinuses with opacification of some of the ethmoid air cells. No acute air-fluid levels. Mastoid air cells are hypoaerated but there are bilateral small mastoid effusions within the aerated portions. Other: Paranasal sinus disease is progressing since previous study. IMPRESSION: No acute intracranial abnormalities. Chronic atrophy and small vessel ischemic changes. Progressing inflammatory changes in the paranasal sinuses since previous study. Bilateral mastoid effusions. Electronically Signed   By: Lucienne Capers M.D.   On: 07/06/2016 22:52   US Renal  Result Date: 07/07/2016 CLINICAL DATA:  Two days fever. History of chronic renal insufficiency, neurogenic bladder, diabetes, hypertension. EXAM: RENAL / URINARY TRACT ULTRASOUND COMPLETE COMPARISON:  Abdominal and pelvic CT scan dated Sep 25, 2014 FINDINGS: Right Kidney: Length: 13.3 cm. The cortical contour is lobulated. The cortical echotexture remains lower than that of the adjacent liver. There is no hydronephrosis. Left Kidney: Length: 12.1 cm. The cortical contour is lobulated similar to that on the right. The echotexture of the cortex is similar to that on the right as well. There is no hydronephrosis. Bladder: There is trabeculation of the urinary bladder wall. No bladder stones are observed. No significant debris is demonstrated. IMPRESSION: There is no hydronephrosis. The urinary bladder demonstrates some trabeculation but no stones or significant debris. Electronically Signed   By: David  Martinique M.D.   On: 07/07/2016 16:27   Dg Chest Port 1 View  Result Date: 07/08/2016 CLINICAL DATA:  Acute respiratory failure, sepsis, previous CVA. EXAM: PORTABLE CHEST 1 VIEW COMPARISON:  Portable chest x-ray of July 07, 2016 FINDINGS: There is hazy increased density at the right lung base. Both lungs are hypoinflated. There is no significant  pleural effusion or pneumothorax. The heart is normal in size. The pulmonary vascularity is not engorged. There is calcification in the wall of the aortic arch. IMPRESSION: Developing atelectasis or pneumonia at the right lung base. Persistent mild hypoinflation. No CHF. Electronically Signed   By: David  Martinique M.D.   On: 07/08/2016 07:28   Dg Chest Port 1 View  Result Date: 07/07/2016 CLINICAL DATA:  Febrile illness. Hx of DM AND HTN. Pt is a former smoker. EXAM: PORTABLE CHEST - 1 VIEW COMPARISON:  07/06/2016 FINDINGS: Lungs are clear ; relatively low volumes. Heart size and mediastinal contours are within normal limits. No effusion. Visualized bones unremarkable. IMPRESSION: No acute cardiopulmonary disease. Electronically Signed   By: Lucrezia Europe M.D.   On: 07/07/2016 20:08   Dg Chest Port 1 View  Result Date: 07/06/2016 CLINICAL DATA:  Urinary tract infection.  Nonproductive cough. EXAM: PORTABLE CHEST 1 VIEW COMPARISON:  None. FINDINGS: Heart is normal in size. There is aortic atherosclerosis with slight uncoiling of the thoracic aorta. There is minimal atelectasis at the right lung base. No pulmonary consolidation or overt pulmonary edema. No acute nor suspicious osseous abnormality. IMPRESSION: No active disease. Electronically Signed   By: Ashley Royalty M.D.   On: 07/06/2016 22:23    Results/Tests Pending at Time of Discharge: final blood culture results  Discharge Medications:  Allergies as of 07/09/2016      Reactions   Metformin And Related Diarrhea   Severe GI  upset, unable to tolerate at low doses   Ciprofloxacin Rash   Vicodin [hydrocodone-acetaminophen] Rash      Medication List    STOP taking these medications   cephALEXin 500 MG capsule Commonly known as:  KEFLEX     TAKE these medications   acetaminophen 325 MG tablet Commonly known as:  TYLENOL Take 650 mg by mouth once.   aspirin 81 MG chewable tablet Chew 81 mg by mouth daily.   atorvastatin 40 MG  tablet Commonly known as:  LIPITOR Take 40 mg by mouth at bedtime.   BENEFIBER PO Take 7.5 mLs by mouth daily. Reported on 08/17/2015   bisacodyl 10 MG suppository Commonly known as:  DULCOLAX Place 10 mg rectally as needed for moderate constipation.   CALCIUM CITRATE + D PO Take 1,000 mg by mouth daily. For low Ca levels   cloNIDine 0.1 MG tablet Commonly known as:  CATAPRES Take 0.1 mg by mouth once.   Cranberry 1000 MG Caps Take 1 capsule (1,000 mg total) by mouth 3 (three) times daily.   divalproex 500 MG DR tablet Commonly known as:  DEPAKOTE Take 500 mg by mouth 2 (two) times daily.   divalproex 250 MG 24 hr tablet Commonly known as:  DEPAKOTE ER Take 2 tablets (500 mg total) by mouth 2 (two) times daily.   doxycycline 100 MG tablet Commonly known as:  VIBRA-TABS Take 1 tablet (100 mg total) by mouth every 12 (twelve) hours.   finasteride 5 MG tablet Commonly known as:  PROSCAR Take 5 mg by mouth daily.   ibuprofen 200 MG tablet Commonly known as:  ADVIL,MOTRIN Take 200 mg by mouth every 6 (six) hours as needed for pain. Reported on 08/20/2015   LANTUS SOLOSTAR 100 UNIT/ML Solostar Pen Generic drug:  Insulin Glargine Inject 35 Units into the skin daily at 10 pm.   lisinopril 5 MG tablet Commonly known as:  PRINIVIL,ZESTRIL Take 5 mg by mouth every morning.   magnesium hydroxide 400 MG/5ML suspension Commonly known as:  MILK OF MAGNESIA Take 30 mLs by mouth daily as needed for mild constipation.   methenamine 1 g tablet Commonly known as:  HIPREX Take 1 tablet (1 g total) by mouth 2 (two) times daily with a meal.   multivitamin with minerals Tabs tablet Take 1 tablet by mouth daily.   Olopatadine HCl 0.2 % Soln Place 1 drop into both eyes daily.   ondansetron 4 MG disintegrating tablet Commonly known as:  ZOFRAN-ODT Take 4 mg by mouth every 8 (eight) hours as needed for nausea or vomiting.   pantoprazole 20 MG tablet Commonly known as:   PROTONIX Take 20 mg by mouth daily.   RA SALINE ENEMA 19-7 GM/118ML Enem Place 1 each rectally as needed (for constipation).   senna 8.6 MG Tabs tablet Commonly known as:  SENOKOT Take 2 tablets by mouth at bedtime.   sodium chloride 0.65 % Soln nasal spray Commonly known as:  OCEAN Place 2 sprays into both nostrils 2 (two) times daily as needed for congestion.   tamsulosin 0.4 MG Caps capsule Commonly known as:  FLOMAX Take 0.8 mg by mouth daily.   venlafaxine XR 75 MG 24 hr capsule Commonly known as:  EFFEXOR-XR Take 75 mg by mouth daily.   vitamin C 1000 MG tablet Take 1 tablet (1,000 mg total) by mouth 2 (two) times daily.       Discharge Instructions: Please refer to Patient Instructions section of EMR for full details.  Patient was counseled important signs and symptoms that should prompt return to medical care, changes in medications, dietary instructions, activity restrictions, and follow up appointments.   Follow-Up Appointments:   Steve Rattler, DO 07/09/2016, 10:32 AM PGY-1, [place]

## 2016-07-08 NOTE — Progress Notes (Signed)
Family Medicine Teaching Service Daily Progress Note Intern Pager: [phone]  Patient name: Reginald Patterson Medical record number: [id number] Date of birth: 1940-06-10 Age: 76 y.o. Gender: male  Primary Care Provider: Gennette Pac, MD Consultants: None Code Status: Full  Pt Overview and Major Events to Date:  Reginald Patterson is a 76 y.o. male presenting with AMS and fever. PMH is significant for h/o CVAs, T2DM, hypertension, hyperlipidemia, dementia, kidney disease, and recurrent UTIs.   Assessment and Plan: 07/07/2016 - Admit to FPTS  AMS:  most likely secondary to pneumonia which showed up on x-ray this morning. Improved slightly with fluid boluses. Urosepsis less likely with urine culture prior to abx no growth. Afebrile overnight. Inflammatory changes in paranasal sinuses and bilateral mastoid effusions on CT head. Flu negative.  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 Actually was spilling urine with large volume of IVFs administered in ED, so condom cath temporarily placed. Takes vitamin C and cranberry supplements at SNF to try to prevent UTIs. Self-caths twice per shift, per wife.  - Continue home finasteride 5 mg, tamsulosin 0.8 mg daily - Order I&O cath twice per shift while patient still confused - Continue cranberry and vitamin C supplements  T2DM: Takes 35 U lantus nightly.  - Ordered for 20 U to begin night of 2/12, hoping patient is eating at that time - Sensitive SSI q4h with CBGs while NPO  Depression/Mood instability: Son reports this is somewhat attributable to strokes.  - Continue home effexor 75 mg daily, depakote 500 mg BID  HLD: - Continue home lipitor 40 mg daily  History of CVA: - Continue daily aspirin,  statin, antihypertensives  HTN: BP to 171/76 on admission - Continue home lisinopril 5 mg daily   FEN/GI: NPO until swallow eval, then carb-modified/heart healthy, protonix Prophylaxis: Lovenox  Disposition: [facility], likely discharge tomorrow  Subjective:  Patient alert and oriented 3 this morning, improved from yesterday, still not at baseline per his wife at bedside. He is in good spirits and converses normally. No acute events overnight  Objective: Temp:  [97.9 F (36.6 C)-98.6 F (37 C)] 98.4 F (36.9 C) (02/13 0550) Pulse Rate:  [56-91] 56 (02/13 0550) Resp:  [16] 16 (02/13 0550) BP: (140-184)/(59-82) 161/59 (02/13 0550) SpO2:  [96 %-98 %] 97 % (02/13 0550) Physical Exam: General: NAD, rests comfortably in bed HEENT: No sinus tenderness Cardiovascular: RRR, no murmurs rubs or gallops Respiratory: Plus coarse breath sounds diffusely, good inspiratory effort Abdomen: Soft nontender nondistended Extremities: No lower extremity edema  Laboratory:  Recent Labs Lab 07/06/16 2137 07/07/16 0649  WBC 5.9 5.5  HGB 12.0* 11.8*  HCT 36.4* 36.3*  PLT 230 212    Recent Labs Lab 07/06/16 2137 07/07/16 0649  NA 136 143  K 3.9 3.7  CL 104 110  CO2 24 26  BUN 10 7  CREATININE 1.00 0.93  CALCIUM 9.3 8.7*  PROT 6.0*  --   BILITOT 0.5  --   ALKPHOS 74  --   ALT 14*  --   AST 17  --   GLUCOSE 236* 143*      Imaging/Diagnostic Tests: US Renal  Result Date: 07/07/2016 CLINICAL DATA:  Two days fever. History of chronic renal insufficiency, neurogenic bladder, diabetes,  hypertension. EXAM: RENAL / URINARY TRACT ULTRASOUND COMPLETE COMPARISON:  Abdominal and pelvic CT scan dated Sep 25, 2014 FINDINGS: Right Kidney: Length: 13.3 cm. The cortical contour is lobulated. The cortical echotexture remains lower than that of the adjacent liver. There is no hydronephrosis. Left Kidney: Length: 12.1 cm. The cortical contour is lobulated similar to that on the right. The  echotexture of the cortex is similar to that on the right as well. There is no hydronephrosis. Bladder: There is trabeculation of the urinary bladder wall. No bladder stones are observed. No significant debris is demonstrated. IMPRESSION: There is no hydronephrosis. The urinary bladder demonstrates some trabeculation but no stones or significant debris. Electronically Signed   By: David  Martinique M.D.   On: 07/07/2016 16:27   Dg Chest Port 1 View  Result Date: 07/08/2016 CLINICAL DATA:  Acute respiratory failure, sepsis, previous CVA. EXAM: PORTABLE CHEST 1 VIEW COMPARISON:  Portable chest x-ray of July 07, 2016 FINDINGS: There is hazy increased density at the right lung base. Both lungs are hypoinflated. There is no significant pleural effusion or pneumothorax. The heart is normal in size. The pulmonary vascularity is not engorged. There is calcification in the wall of the aortic arch. IMPRESSION: Developing atelectasis or pneumonia at the right lung base. Persistent mild hypoinflation. No CHF. Electronically Signed   By: David  Martinique M.D.   On: 07/08/2016 07:28   Dg Chest Port 1 View  Result Date: 07/07/2016 CLINICAL DATA:  Febrile illness. Hx of DM AND HTN. Pt is a former smoker. EXAM: PORTABLE CHEST - 1 VIEW COMPARISON:  07/06/2016 FINDINGS: Lungs are clear ; relatively low volumes. Heart size and mediastinal contours are within normal limits. No effusion. Visualized bones unremarkable. IMPRESSION: No acute cardiopulmonary disease. Electronically Signed   By: Lucrezia Europe M.D.   On: 07/07/2016 20:08    US Renal  Result Date: 07/07/2016 CLINICAL DATA:  Two days fever. History of chronic renal insufficiency, neurogenic bladder, diabetes, hypertension. EXAM: RENAL / URINARY TRACT ULTRASOUND COMPLETE COMPARISON:  Abdominal and pelvic CT scan dated Sep 25, 2014 FINDINGS: Right Kidney: Length: 13.3 cm. The cortical contour is lobulated. The cortical echotexture remains lower than that of the adjacent  liver. There is no hydronephrosis. Left Kidney: Length: 12.1 cm. The cortical contour is lobulated similar to that on the right. The echotexture of the cortex is similar to that on the right as well. There is no hydronephrosis. Bladder: There is trabeculation of the urinary bladder wall. No bladder stones are observed. No significant debris is demonstrated. IMPRESSION: There is no hydronephrosis. The urinary bladder demonstrates some trabeculation but no stones or significant debris. Electronically Signed   By: David  Martinique M.D.   On: 07/07/2016 16:27   Dg Chest Port 1 View  Result Date: 07/08/2016 CLINICAL DATA:  Acute respiratory failure, sepsis, previous CVA. EXAM: PORTABLE CHEST 1 VIEW COMPARISON:  Portable chest x-ray of July 07, 2016 FINDINGS: There is hazy increased density at the right lung base. Both lungs are hypoinflated. There is no significant pleural effusion or pneumothorax. The heart is normal in size. The pulmonary vascularity is not engorged. There is calcification in the wall of the aortic arch. IMPRESSION: Developing atelectasis or pneumonia at the right lung base. Persistent mild hypoinflation. No CHF. Electronically Signed   By: David  Martinique M.D.   On: 07/08/2016 07:28     Everrett Coombe, MD 07/08/2016, 9:50 AM PGY-1, [place] Intern pager: [phone], text pages welcome

## 2016-07-08 NOTE — Evaluation (Signed)
Physical Therapy Evaluation Patient Details Name: Reginald Patterson MRN: [id number] DOB: 1940-09-07 Today's Date: 07/08/2016   History of Present Illness  Patient is a 76 yo male admitted 07/06/16 with fall, confusion, and HCAP.  Patient is resident at [facility].    PMH:  CVA, DM, HTN, HLD, dementia, CKD, depression, recurrent UTI's  Clinical Impression  Patient presents with problems listed below.  Will benefit from acute PT to maximize functional mobility prior to return to SNF at d/c.    Follow Up Recommendations SNF;Supervision/Assistance - 24 hour    Equipment Recommendations  None recommended by PT    Recommendations for Other Services       Precautions / Restrictions Precautions Precautions: Fall Precaution Comments: 3 falls in last year at [facility] per son Restrictions Weight Bearing Restrictions: No      Mobility  Bed Mobility Overal bed mobility: Needs Assistance Bed Mobility: Supine to Sit     Supine to sit: Min assist     General bed mobility comments: Patient able to initiate supine > sit.  Required assist to bring hips forward to EOB to get feet to floor.  Transfers Overall transfer level: Needs assistance Equipment used: 1 person hand held assist Transfers: Sit to/from [facility] Sit to Stand: Min assist Stand pivot transfers: Min assist       General transfer comment: Assist to rise to standing and to steady in stance for pericare.  Patient able to take several shuffle steps to pivot to chair.  Ambulation/Gait             General Gait Details: NT  Stairs            Wheelchair Mobility    Modified Rankin (Stroke Patients Only)       Balance Overall balance assessment: Needs assistance;History of Falls Sitting-balance support: No upper extremity supported;Feet supported Sitting balance-Leahy Scale: Fair     Standing balance support: Single extremity supported Standing balance-Leahy Scale: Poor                                Pertinent Vitals/Pain Pain Assessment: No/denies pain    Home Living Family/patient expects to be discharged to:: Skilled nursing facility                      Prior Function Level of Independence: Needs assistance   Gait / Transfers Assistance Needed: Staff assists patient to ambulate with RW and to transfer into w/c.  ADL's / Homemaking Assistance Needed: Assist for ADL's        Hand Dominance        Extremity/Trunk Assessment   Upper Extremity Assessment Upper Extremity Assessment: Generalized weakness    Lower Extremity Assessment Lower Extremity Assessment: Generalized weakness    Cervical / Trunk Assessment Cervical / Trunk Assessment: Kyphotic  Communication   Communication: HOH  Cognition Arousal/Alertness: Awake/alert Behavior During Therapy: Flat affect;Anxious Overall Cognitive Status: No family/caregiver present to determine baseline cognitive functioning                 General Comments: Patient oriented x2 - disoriented to time.  Able to follow commands.  Patient lying in bed soaked in urine, and had not called for assistance.    General Comments      Exercises     Assessment/Plan    PT Assessment Patient needs continued PT services  PT Problem List Decreased strength;Decreased activity  tolerance;Decreased balance;Decreased mobility;Decreased cognition;Decreased knowledge of use of DME;Decreased safety awareness          PT Treatment Interventions DME instruction;Gait training;Functional mobility training;Therapeutic activities;Therapeutic exercise;Balance training;Cognitive remediation;Patient/family education    PT Goals (Current goals can be found in the Care Plan section)  Acute Rehab PT Goals Patient Stated Goal: None stated PT Goal Formulation: With patient Time For Goal Achievement: 07/22/16 Potential to Achieve Goals: Fair    Frequency Min 2X/week   Barriers to discharge         Co-evaluation               End of Session Equipment Utilized During Treatment: Gait belt Activity Tolerance: Patient tolerated treatment well Patient left: in chair;with call bell/phone within reach;with nursing/sitter in room Nurse Communication: Mobility status         Time: 1540-1600 PT Time Calculation (min) (ACUTE ONLY): 20 min   Charges:   PT Evaluation $PT Eval Moderate Complexity: 1 Procedure     PT G Codes:        Despina Pole 2016/07/10, 8:22 PM Carita Pian. Sanjuana Kava, [place] Pager [phone]

## 2016-07-09 ENCOUNTER — Encounter: Payer: Self-pay | Admitting: Family Medicine

## 2016-07-09 ENCOUNTER — Non-Acute Institutional Stay: Payer: Medicare Other | Admitting: Family Medicine

## 2016-07-09 DIAGNOSIS — M542 Cervicalgia: Secondary | ICD-10-CM | POA: Diagnosis not present

## 2016-07-09 DIAGNOSIS — R339 Retention of urine, unspecified: Secondary | ICD-10-CM | POA: Diagnosis not present

## 2016-07-09 DIAGNOSIS — E785 Hyperlipidemia, unspecified: Secondary | ICD-10-CM | POA: Diagnosis not present

## 2016-07-09 DIAGNOSIS — Z593 Problems related to living in residential institution: Secondary | ICD-10-CM | POA: Diagnosis not present

## 2016-07-09 DIAGNOSIS — J159 Unspecified bacterial pneumonia: Secondary | ICD-10-CM

## 2016-07-09 DIAGNOSIS — G9340 Encephalopathy, unspecified: Secondary | ICD-10-CM | POA: Diagnosis not present

## 2016-07-09 DIAGNOSIS — Z794 Long term (current) use of insulin: Secondary | ICD-10-CM | POA: Diagnosis not present

## 2016-07-09 DIAGNOSIS — N312 Flaccid neuropathic bladder, not elsewhere classified: Secondary | ICD-10-CM

## 2016-07-09 DIAGNOSIS — E1122 Type 2 diabetes mellitus with diabetic chronic kidney disease: Secondary | ICD-10-CM | POA: Diagnosis not present

## 2016-07-09 DIAGNOSIS — N319 Neuromuscular dysfunction of bladder, unspecified: Secondary | ICD-10-CM | POA: Diagnosis not present

## 2016-07-09 DIAGNOSIS — Z7982 Long term (current) use of aspirin: Secondary | ICD-10-CM | POA: Diagnosis not present

## 2016-07-09 DIAGNOSIS — E118 Type 2 diabetes mellitus with unspecified complications: Secondary | ICD-10-CM | POA: Diagnosis not present

## 2016-07-09 DIAGNOSIS — R296 Repeated falls: Secondary | ICD-10-CM

## 2016-07-09 DIAGNOSIS — N189 Chronic kidney disease, unspecified: Secondary | ICD-10-CM | POA: Diagnosis not present

## 2016-07-09 DIAGNOSIS — M6281 Muscle weakness (generalized): Secondary | ICD-10-CM | POA: Diagnosis not present

## 2016-07-09 DIAGNOSIS — K219 Gastro-esophageal reflux disease without esophagitis: Secondary | ICD-10-CM | POA: Diagnosis not present

## 2016-07-09 DIAGNOSIS — Z466 Encounter for fitting and adjustment of urinary device: Secondary | ICD-10-CM | POA: Diagnosis not present

## 2016-07-09 DIAGNOSIS — I129 Hypertensive chronic kidney disease with stage 1 through stage 4 chronic kidney disease, or unspecified chronic kidney disease: Secondary | ICD-10-CM | POA: Diagnosis not present

## 2016-07-09 DIAGNOSIS — N400 Enlarged prostate without lower urinary tract symptoms: Secondary | ICD-10-CM | POA: Diagnosis not present

## 2016-07-09 DIAGNOSIS — Z87440 Personal history of urinary (tract) infections: Secondary | ICD-10-CM | POA: Diagnosis not present

## 2016-07-09 DIAGNOSIS — Z8673 Personal history of transient ischemic attack (TIA), and cerebral infarction without residual deficits: Secondary | ICD-10-CM | POA: Diagnosis not present

## 2016-07-09 DIAGNOSIS — A419 Sepsis, unspecified organism: Secondary | ICD-10-CM | POA: Diagnosis not present

## 2016-07-09 LAB — BASIC METABOLIC PANEL
Anion gap: 7 (ref 5–15)
BUN: 13 mg/dL (ref 6–20)
CO2: 30 mmol/L (ref 22–32)
Calcium: 9.3 mg/dL (ref 8.9–10.3)
Chloride: 105 mmol/L (ref 101–111)
Creatinine, Ser: 1.07 mg/dL (ref 0.61–1.24)
GFR calc Af Amer: >60 mL/min (ref >60)
GFR calc non Af Amer: >60 mL/min (ref >60)
Glucose, Bld: 153 mg/dL — ABNORMAL HIGH (ref 65–99)
Potassium: 4.6 mmol/L (ref 3.5–5.1)
Sodium: 142 mmol/L (ref 135–145)

## 2016-07-09 LAB — CBC
HEMATOCRIT: 37.8 % — AB (ref 39.0–52.0)
Hemoglobin: 12.2 g/dL — ABNORMAL LOW (ref 13.0–17.0)
MCH: 27.4 pg (ref 26.0–34.0)
MCHC: 32.3 g/dL (ref 30.0–36.0)
MCV: 84.8 fL (ref 78.0–100.0)
PLATELETS: 231 10*3/uL (ref 150–400)
RBC: 4.46 MIL/uL (ref 4.22–5.81)
RDW: 15.5 % (ref 11.5–15.5)
WBC: 9.3 10*3/uL (ref 4.0–10.5)

## 2016-07-09 LAB — GLUCOSE, CAPILLARY
GLUCOSE-CAPILLARY: 153 mg/dL — AB (ref 65–99)
Glucose-Capillary: 258 mg/dL — ABNORMAL HIGH (ref 65–99)

## 2016-07-09 MED ORDER — DOXYCYCLINE HYCLATE 100 MG PO TABS: 100.0000 mg | ORAL_TABLET | Freq: Two times a day (BID) | ORAL | 0 refills | Status: DC

## 2016-07-09 MED ORDER — AMOXICILLIN-POT CLAVULANATE 875-125 MG PO TABS: 1.0000 | ORAL_TABLET | Freq: Two times a day (BID) | ORAL | 0 refills | Status: DC

## 2016-07-09 NOTE — Clinical Social Work Note (Signed)
CSW confirmed returning to [place] with Suanne Marker (admission). Son aware and agreeable to discharge plan. Family to transport. RN provided room and report and also in treatment team sticky note. CSW signing off as no further needs identified.     Oretha Ellis, MSW, [place] Social Worker  [phone]

## 2016-07-09 NOTE — Progress Notes (Signed)
[place]  Visit  Primary Care Provider: Junie Panning, DO Location of Care: [facility] Visit Information: a scheduled visit following a recent hospitalization Patient accompanied by patient and spouse Source(s) of information for visit: patient, spouse/SO, nursing home and past medical records  Chief Complaint: No chief complaint on file.   Nursing Concerns: no new Nutrition Concerns: no new Wound Care Nurse Concerns: no new PT / OT Concerns: deconditioning and falls  HISTORY OF PRESENT ILLNESS: Reginald Patterson is a 76 y.o. admitted to [facility] from 2/11-2/14/18.  He presented to [facility] with altered mental status.  The day prior to admission, patient had an unwitnessed fall.  While there was no external evidence of head injury, a CT head was obtained to evaluate for acute intracranial abnormalities.  CT head was negative.  Other initial imaging obtained included CXR which showed no evidence of pneumonia.  Flu negative.  Given patient's history of neurogenic bladder requiring self I/O cath, UTI was considered as possible reason for AMS.  However, he had been on Keflex treatment for almost 1 week prior to hospitalization and his Urine culture obtained prior to starting Keflex was actually negative for bacterial growth.  He was treated with broad spectrum abx until a source of infection could be determined, as he was meeting sepsis criteria in the ED.  On 07/08/16, repeat CXR revealed what appeared to be a RLL pneumonia vs atelectasis.  He was transitioned to PO doxycycline given his history of fluoroquinolone allergy.  He was discharged back to [facility] after being stable on PO abx for 24 hours.  Today, his wife reports he is doing ok.  She is worried about him falling again.  She notes that he is quite weak and is not able to stand on his own.  Patient reports he feels "so/so".  He denies SOB, CP, dizziness, dysuria, chills.  Endorses occ cough.   Outpatient Encounter  Prescriptions as of 07/09/2016  Medication Sig  . acetaminophen (TYLENOL) 325 MG tablet Take 650 mg by mouth once.  . Ascorbic Acid (VITAMIN C) 1000 MG tablet Take 1 tablet (1,000 mg total) by mouth 2 (two) times daily.  Marland Kitchen aspirin 81 MG chewable tablet Chew 81 mg by mouth daily.  Marland Kitchen atorvastatin (LIPITOR) 40 MG tablet Take 40 mg by mouth at bedtime.  . bisacodyl (DULCOLAX) 10 MG suppository Place 10 mg rectally as needed for moderate constipation.  . Calcium Citrate-Vitamin D (CALCIUM CITRATE + D PO) Take 1,000 mg by mouth daily. For low Ca levels  . cloNIDine (CATAPRES) 0.1 MG tablet Take 0.1 mg by mouth once.  . Cranberry 1000 MG CAPS Take 1 capsule (1,000 mg total) by mouth 3 (three) times daily.  . divalproex (DEPAKOTE ER) 250 MG 24 hr tablet Take 2 tablets (500 mg total) by mouth 2 (two) times daily. (Patient not taking: Reported on 07/06/2016)  . divalproex (DEPAKOTE) 500 MG DR tablet Take 500 mg by mouth 2 (two) times daily.  Marland Kitchen doxycycline (VIBRA-TABS) 100 MG tablet Take 1 tablet (100 mg total) by mouth every 12 (twelve) hours.  . finasteride (PROSCAR) 5 MG tablet Take 5 mg by mouth daily.  Marland Kitchen ibuprofen (ADVIL,MOTRIN) 200 MG tablet Take 200 mg by mouth every 6 (six) hours as needed for pain. Reported on 08/20/2015  . Insulin Glargine (LANTUS SOLOSTAR) 100 UNIT/ML SOPN Inject 35 Units into the skin daily at 10 pm.   . lisinopril (PRINIVIL,ZESTRIL) 5 MG tablet Take 5 mg by mouth every morning.  Marland Kitchen  magnesium hydroxide (MILK OF MAGNESIA) 400 MG/5ML suspension Take 30 mLs by mouth daily as needed for mild constipation.  . methenamine (HIPREX) 1 g tablet Take 1 tablet (1 g total) by mouth 2 (two) times daily with a meal. (Patient not taking: Reported on 07/06/2016)  . Multiple Vitamin (MULTIVITAMIN WITH MINERALS) TABS Take 1 tablet by mouth daily.  . Olopatadine HCl 0.2 % SOLN Place 1 drop into both eyes daily.  . ondansetron (ZOFRAN-ODT) 4 MG disintegrating tablet Take 4 mg by mouth every 8 (eight)  hours as needed for nausea or vomiting.  . pantoprazole (PROTONIX) 20 MG tablet Take 20 mg by mouth daily.  Marland Kitchen senna (SENOKOT) 8.6 MG TABS tablet Take 2 tablets by mouth at bedtime.  . sodium chloride (OCEAN) 0.65 % SOLN nasal spray Place 2 sprays into both nostrils 2 (two) times daily as needed for congestion.  . Sodium Phosphates (RA SALINE ENEMA) 19-7 GM/118ML ENEM Place 1 each rectally as needed (for constipation).  . tamsulosin (FLOMAX) 0.4 MG CAPS capsule Take 0.8 mg by mouth daily.  Marland Kitchen venlafaxine XR (EFFEXOR-XR) 75 MG 24 hr capsule Take 75 mg by mouth daily.  . Wheat Dextrin (BENEFIBER PO) Take 7.5 mLs by mouth daily. Reported on 08/17/2015   Facility-Administered Encounter Medications as of 07/09/2016  Medication  . acetaminophen (TYLENOL) tablet 650 mg   Or  . acetaminophen (TYLENOL) suppository 650 mg  . aspirin chewable tablet 81 mg  . atorvastatin (LIPITOR) tablet 40 mg  . bisacodyl (DULCOLAX) suppository 10 mg  . divalproex (DEPAKOTE) DR tablet 500 mg  . doxycycline (VIBRA-TABS) tablet 100 mg  . enoxaparin (LOVENOX) injection 40 mg  . finasteride (PROSCAR) tablet 5 mg  . insulin aspart (novoLOG) injection 0-9 Units  . insulin glargine (LANTUS) injection 20 Units  . lisinopril (PRINIVIL,ZESTRIL) tablet 5 mg  . magnesium hydroxide (MILK OF MAGNESIA) suspension 30 mL  . olopatadine (PATANOL) 0.1 % ophthalmic solution 1 drop  . ondansetron (ZOFRAN-ODT) disintegrating tablet 4 mg  . pantoprazole (PROTONIX) EC tablet 20 mg  . senna (SENOKOT) tablet 17.2 mg  . sodium chloride (OCEAN) 0.65 % nasal spray 2 spray  . tamsulosin (FLOMAX) capsule 0.8 mg  . venlafaxine XR (EFFEXOR-XR) 24 hr capsule 75 mg  . vitamin C (ASCORBIC ACID) tablet 1,000 mg   Allergies  Allergen Reactions  . Metformin And Related Diarrhea    Severe GI upset, unable to tolerate at low doses  . Ciprofloxacin Rash  . Vicodin [Hydrocodone-Acetaminophen] Rash   History Patient Active Problem List   Diagnosis  Date Noted  . Healthcare associated bacterial pneumonia 07/08/2016  . Febrile illness   . UTI (urinary tract infection) 07/07/2016  . Sepsis ([place]) 07/07/2016  . Pyelonephritis, presumed 07/07/2016  . Persistent fever   . Vascular dementia without behavioral disturbance   . Constipation 06/27/2016  . Neurogenic bladder, flaccid   . Complicated UTI (urinary tract infection) 08/20/2015  . Colon polyps   . 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 Falls frequently 06/22/2015  . Acute delirium 06/17/2015  . Acute encephalopathy 06/16/2015  . Chronic low back pain 11/24/2014  . Anemia 10/04/2011  . Depression 10/04/2011  . CVA (cerebral infarction) 10/02/2011  . HTN (hypertension) 10/02/2011  . Hyperlipidemia 10/02/2011  . Diabetes mellitus, type 2 ([place]) 10/02/2011   Past Medical History:  Diagnosis Date  . Acute encephalopathy 06/16/2015  . Agitation 02/20/2015  . Anemia 11-08-12  mild anemia  . Anxiety   . Anxiety state 06/22/2015  . Chronic kidney disease    must self cath  . Colon polyps   . Constipation 06/27/2016  . CVA (cerebral infarction) 10/02/2011  . Dementia 06/22/2015  . Depression 10/04/2011  . Diabetes mellitus, type 2 ([place]) 10/02/2011  . Encephalopathy acute 06/17/2015  . Falls frequently 06/22/2015  . Fecal incontinence   . Fracture of rib of right side 06/15/2015   8th right posterior rib fracture   . Healthcare associated bacterial pneumonia 07/08/2016  . HTN (hypertension) 10/02/2011  . Hyperlipidemia   . Hypertension   . Incontinence 06/22/2015  . Infection of urinary tract 07/25/2015  . Leg weakness 02/20/2015  . 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 Stroke [facility]) 11-08-12   x2 , last 09-29-11(confusion,left side weakness) no residual"some memory problems.  Marland Kitchen UTI (lower urinary tract infection) 06/16/15; 07/25/14  . Weight loss, unintentional 07/25/2015   Past Surgical  History:  Procedure Laterality Date  . [place]- metal plate and screws for degenerative changes spine  . COLONOSCOPY WITH PROPOFOL N/A 01/12/2013   Procedure: COLONOSCOPY WITH PROPOFOL;  Surgeon: Arta Silence, MD;  Location: [facility];  Service: Endoscopy;  Laterality: N/A;   Family History  Problem Relation Age of Onset  . Dementia Mother   . Alzheimer's disease Mother   . Stroke Mother   . Lung cancer Father   . Colon cancer Maternal Grandmother     reports that he has quit smoking. His smoking use included Cigarettes. He has a 5.00 pack-year smoking history. He quit smokeless tobacco use about 54 years ago. His smokeless tobacco use included Chew. He reports that he drinks alcohol. He reports that he does not use drugs.  Basic Activities of Daily Living   ADLs Independent Needs Assistance Dependent  Bathing  x   Dressing  x   Ambulation  x   Toileting  x   Eating x       Instrumental Activities of Daily Living  IADL Independent Needs Assistance Dependent  Cooking   x  Housework   x  Manage Medications   x  Manage the telephone x    Shopping for food, clothes, Meds, etc   x  Use transportation   x  Manage Finances   x    Falls in the past six months:   yes  Diet:  general Supplemental shakes:  n/a  Review of Systems  Patient has ability to communicate answers to ROS: yes See HPI  Pain:  no  Dyspnea: Dyspnea Rating: none Dyspnea Goal: none Dyspnea Therapies: antibiotics  Dyspnea Response to Therapies: good   General: Denies fevers, chills, weight loss, fatigue, weight gain.  Eyes: Denies pain, blurred vision  Ears/Nose/Throat: Denies ear pain, throat pain, rhinorrhea, nasal congestion.  Cardiovascular: Denies chest pains, palpitations, dyspnea on exertion, orthopnea, peripheral edema.  Respiratory: Denies cough, sputum, dyspnea.  Gastrointestinal: Denies abd pain, bloating, constipation, diarrhea.  Genitourinary: Denies dysuria,  urinary frequency, discharge Musculoskeletal: Denies joint pain, swelling, weakness.  Skin: Denies skin rash or ulcers. Neurologic: Denies transient paralysis, weakness, paresthesias, headache.  Psychiatric: Denies depression, anxiety, psychosis. Endocrine: Denies weight change.   PHYSICAL EXAM:. Wt Readings from Last 3 Encounters:  07/07/16 181 lb 3.5 oz (82.2 kg)  03/22/16 182 lb (82.6 kg)  01/10/16 183 lb (83 kg)   Temp Readings from Last 3 Encounters:  07/09/16 (!) 96.9 F (36.1 C)  07/09/16 97.3 F (36.3 C) (  Oral)  07/01/16 97.3 F (36.3 C)   BP Readings from Last 3 Encounters:  07/09/16 118/79  07/09/16 133/75  07/01/16 124/75   Pulse Readings from Last 3 Encounters:  07/09/16 70  07/09/16 (!) 57  07/01/16 67    General: alert, cooperative, no distress, well nourished, pleasant, clean, groomed; wife at bedside HEENT:  No scleral icterus, MMM, dentures Neck:  Supple, No JVD, no lymphadenopathy CV:  RRR, no murmur, no ankle swelling RESP: No resp distress or accessory muscle use. Bilateral bases with decreased breath sounds R>L. ABD:  Soft, Non-tender, non-distended, +bowel sounds, no masses MSK:  No back pain, no joint pain.  No joint swelling or redness EXT: No edema  Gait:  Not tested and sitting in wheelchair.  Difficulty getting to a standing position Skin: normal Neurologic: speech normal.  Hard of hearing. Psych:  Pleasant, mood stable    MMSE - Mini Mental State Exam 07/03/2015 05/04/2015  Orientation to time 2 4  Orientation to Place 5 4  Registration 3 3  Attention/ Calculation 0 0  Recall 0 2  Language- name 2 objects 2 2  Language- repeat 1 1  Language- follow 3 step command 3 3  Language- read & follow direction 1 1  Write a sentence 1 1  Copy design 0 0  Total score 18 21   No flowsheet data found.  Assessment and Plan:   See Problem List for individual problem's assessment and plans.   Family communication: Wife at bedside.  Updated on  abx changes.  Advanced Directives (MOST form, Living Will, HCPOA): HCPOA is Mrs Ian Franzoni  Code Status:    DNR Intubation Status: no Intravenous Fluids:  yes Antibiotics: yes Hospitalization: yes Emergency contact:  Reatha Armour   Follow Up:  Next 2 days unless acute issues arise.  PCP to see on Friday.  1. Nursing home resident - Admit to [facility] - DNR, confirmed with wife  2. Healthcare associated bacterial pneumonia - Stop Doxycycline - Start Augmentin 875mg  BID x4 days to complete 7 day course of abx.  NO renal adjustment needed per May, PharmD - Probiotic ordered daily x2 weeks.  3. Neurogenic bladder, flaccid - Ok to resume in and out caths  4. Falls frequently - PT/OT to eval and treat for deconditioning in the setting of illness  Pallie Swigert M. Lajuana Ripple, DO PGY-3, [facility]

## 2016-07-09 NOTE — Progress Notes (Signed)
Family Medicine Teaching Service Daily Progress Note Intern Pager: [phone]  Patient name: Reginald Patterson Medical record number: [id number] Date of birth: [birth year] Age: 76 y.o. Gender: male  Primary Care Provider: Gennette Pac, MD Consultants: none Code Status: full  Pt Overview and Major Events to Date:  2/12- admitted to [facility]PTS  Assessment and Plan: Reginald Schrage Hensonis a 76 y.o.malepresenting with AMS and fever. PMH is significant for h/o CVAs, T2DM, hypertension, hyperlipidemia, dementia, kidney disease, and recurrent UTIs.   [id number] likely secondary to pneumonia seen on CXR from 2/13. Improved slightly with fluid boluses. Urosepsis less likely with urine culture prior to abx no growth. Afebrile overnight. Inflammatory changes in paranasal sinuses and bilateral mastoid effusions on CT head. Flu negative.  - Transitioned to Doxycycline, patient allergic to FQ's, will complete 7 day course for HAP - Continue to assess neuro status - PT/OT recommend SNF - BCx NGTD  Urinary retention:Noted to have neurogenic bladder, BPH. Actually was spilling urine with large volume of IVFs administered in ED, so condom cath temporarily placed. Takes vitamin C and cranberry supplements at SNF to try to prevent UTIs. Self-caths twice per shift, per wife.  - Continue home finasteride 5 mg, tamsulosin 0.8 mg daily - Order I&O cath twice per shift  - Continue cranberry and vitamin C supplements  T2DM:Takes 35 U lantus nightly.  - Ordered for 20 U to begin night of 2/12, hoping patient is eating at that time - Sensitive SSI q4h with CBGs while NPO  Depression/Mood instability: Son reports this is somewhat attributable to strokes.  - Continue home effexor 75 mg daily, depakote 500 mg BID  HLD: - Continue home lipitor 40 mg daily  History of CVA: - Continue daily aspirin, statin, antihypertensives  HTN: BP to 171/76 on admission - Continue home lisinopril 5 mg daily  FEN/GI: NPO  until swallow eval, then carb-modified/heart healthy, protonix Prophylaxis: Lovenox  Disposition: back to [place] likely today   Subjective:  Reginald Patterson is doing very well today, he feels much better. He feels well to go back to SNF. No fevers or chills, minimal coughing.   Objective: Temp:  [97.3 F (36.3 C)-98.8 F (37.1 C)] 97.3 F (36.3 C) (02/14 0544) Pulse Rate:  [57-77] 57 (02/14 0544) Resp:  [17-18] 17 (02/14 0544) BP: (121-145)/(59-75) 133/75 (02/14 0544) SpO2:  [94 %-97 %] 94 % (02/14 0544) Physical Exam: General: NAD, rests comfortably in bed HEENT: No sinus tenderness Cardiovascular: RRR, no murmurs rubs or gallops Respiratory: Plus coarse breath sounds diffusely, good inspiratory effort Abdomen: Soft nontender nondistended Extremities: No lower extremity edema Neuro: A&Ox3, no focal deficits  Laboratory:  Recent Labs Lab 07/06/16 2137 07/07/16 0649 07/09/16 0459  WBC 5.9 5.5 9.3  HGB 12.0* 11.8* 12.2*  HCT 36.4* 36.3* 37.8*  PLT 230 212 231    Recent Labs Lab 07/06/16 2137 07/07/16 0649 07/09/16 0459  NA 136 143 142  K 3.9 3.7 4.6  CL 104 110 105  CO2 24 26 30   BUN 10 7 13   CREATININE 1.00 0.93 1.07  CALCIUM 9.3 8.7* 9.3  PROT 6.0*  --   --   BILITOT 0.5  --   --   ALKPHOS 74  --   --   ALT 14*  --   --   AST 17  --   --   GLUCOSE 236* 143* 153*     Imaging/Diagnostic Tests: Ct Head Wo Contrast  Result Date: 07/06/2016 CLINICAL DATA:  Septic cough. History of  stroke, hypertension, acute encephalopathy, diabetes, dementia, and stroke. EXAM: CT HEAD WITHOUT CONTRAST TECHNIQUE: Contiguous axial images were obtained from the base of the skull through the vertex without intravenous contrast. COMPARISON:  03/22/2016 FINDINGS: Brain: No evidence of acute infarction, hemorrhage, hydrocephalus, extra-axial collection or mass lesion/mass effect. Mild cerebral atrophy. Patchy low-attenuation changes in the deep white matter consistent with small  vessel ischemia. Vascular: Vascular calcifications are present in the carotid siphons. Skull: Calvarium appears intact. Sinuses/Orbits: Mucosal thickening throughout the paranasal sinuses with opacification of some of the ethmoid air cells. No acute air-fluid levels. Mastoid air cells are hypoaerated but there are bilateral small mastoid effusions within the aerated portions. Other: Paranasal sinus disease is progressing since previous study. IMPRESSION: No acute intracranial abnormalities. Chronic atrophy and small vessel ischemic changes. Progressing inflammatory changes in the paranasal sinuses since previous study. Bilateral mastoid effusions. Electronically Signed   By: Lucienne Capers M.D.   On: 07/06/2016 22:52   US Renal  Result Date: 07/07/2016 CLINICAL DATA:  Two days fever. History of chronic renal insufficiency, neurogenic bladder, diabetes, hypertension. EXAM: RENAL / URINARY TRACT ULTRASOUND COMPLETE COMPARISON:  Abdominal and pelvic CT scan dated Sep 25, 2014 FINDINGS: Right Kidney: Length: 13.3 cm. The cortical contour is lobulated. The cortical echotexture remains lower than that of the adjacent liver. There is no hydronephrosis. Left Kidney: Length: 12.1 cm. The cortical contour is lobulated similar to that on the right. The echotexture of the cortex is similar to that on the right as well. There is no hydronephrosis. Bladder: There is trabeculation of the urinary bladder wall. No bladder stones are observed. No significant debris is demonstrated. IMPRESSION: There is no hydronephrosis. The urinary bladder demonstrates some trabeculation but no stones or significant debris. Electronically Signed   By: David  Martinique M.D.   On: 07/07/2016 16:27   Dg Chest Port 1 View  Result Date: 07/08/2016 CLINICAL DATA:  Acute respiratory failure, sepsis, previous CVA. EXAM: PORTABLE CHEST 1 VIEW COMPARISON:  Portable chest x-ray of July 07, 2016 FINDINGS: There is hazy increased density at the right  lung base. Both lungs are hypoinflated. There is no significant pleural effusion or pneumothorax. The heart is normal in size. The pulmonary vascularity is not engorged. There is calcification in the wall of the aortic arch. IMPRESSION: Developing atelectasis or pneumonia at the right lung base. Persistent mild hypoinflation. No CHF. Electronically Signed   By: David  Martinique M.D.   On: 07/08/2016 07:28   Dg Chest Port 1 View  Result Date: 07/07/2016 CLINICAL DATA:  Febrile illness. Hx of DM AND HTN. Pt is a former smoker. EXAM: PORTABLE CHEST - 1 VIEW COMPARISON:  07/06/2016 FINDINGS: Lungs are clear ; relatively low volumes. Heart size and mediastinal contours are within normal limits. No effusion. Visualized bones unremarkable. IMPRESSION: No acute cardiopulmonary disease. Electronically Signed   By: Lucrezia Europe M.D.   On: 07/07/2016 20:08   Dg Chest Port 1 View  Result Date: 07/06/2016 CLINICAL DATA:  Urinary tract infection.  Nonproductive cough. EXAM: PORTABLE CHEST 1 VIEW COMPARISON:  None. FINDINGS: Heart is normal in size. There is aortic atherosclerosis with slight uncoiling of the thoracic aorta. There is minimal atelectasis at the right lung base. No pulmonary consolidation or overt pulmonary edema. No acute nor suspicious osseous abnormality. IMPRESSION: No active disease. Electronically Signed   By: Ashley Royalty M.D.   On: 07/06/2016 22:23    Steve Rattler, DO 07/09/2016, 7:02 AM PGY-1, [place]  Intern pager: [phone], text pages welcome

## 2016-07-09 NOTE — NC FL2 (Signed)
[place] LEVEL OF CARE SCREENING TOOL     IDENTIFICATION  Patient Name: Reginald Patterson Birthdate: 01/09/1941 Sex: male Admission Date (Current Location): 07/06/2016  [facility] and [place] Number:  Herbalist and Address:  [facility], [place] [address], [place], [place] [address]      Provider Number: [id number]  Attending Physician Name and Address:  Blane Ohara McDiarmid, MD  Relative Name and Phone Number:       Current Level of Care: Hospital Recommended Level of Care: [place] Prior Approval Number:    Date Approved/Denied:   PASRR Number: [id number]  Discharge Plan: SNF    Current Diagnoses: Patient Active Problem List   Diagnosis Date Noted  . Healthcare associated bacterial pneumonia 07/08/2016  . Febrile illness   . UTI (urinary tract infection) 07/07/2016  . Sepsis ([place]) 07/07/2016  . Pyelonephritis, presumed 07/07/2016  . Persistent fever   . Vascular dementia without behavioral disturbance   . Constipation 06/27/2016  . Neurogenic bladder, flaccid   . Complicated UTI (urinary tract infection) 08/20/2015  . Colon polyps   . 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 Falls frequently 06/22/2015  . Acute delirium 06/17/2015  . Acute encephalopathy 06/16/2015  . Chronic low back pain 11/24/2014  . Anemia 10/04/2011  . Depression 10/04/2011  . CVA (cerebral infarction) 10/02/2011  . HTN (hypertension) 10/02/2011  . Hyperlipidemia 10/02/2011  . Diabetes mellitus, type 2 ([place]) 10/02/2011    Orientation RESPIRATION BLADDER Height & Weight     Self, Situation, Place  Normal Incontinent Weight: 181 lb 3.5 oz (82.2 kg) Height:  5\' 7"  (170.2 cm)  BEHAVIORAL SYMPTOMS/MOOD NEUROLOGICAL BOWEL NUTRITION STATUS      Continent Diet (Dys 1; thin liquids)  AMBULATORY STATUS COMMUNICATION OF NEEDS Skin    Extensive Assist Verbally Normal                       Personal Care Assistance Level of Assistance  Bathing, Dressing, Feeding Bathing Assistance: Maximum assistance Feeding assistance: Limited assistance Dressing Assistance: Maximum assistance     Functional Limitations Info  Sight, Hearing, Speech Sight Info: Adequate Hearing Info: Adequate Speech Info: Adequate    SPECIAL CARE FACTORS FREQUENCY  PT (By licensed PT), OT (By licensed OT)     PT Frequency: 5 OT Frequency: 5            Contractures Contractures Info: Not present    Additional Factors Info  Code Status, Allergies Code Status Info: DNR Allergies Info: Metformin And Related, Ciprofloxacin, Vicodin Hydrocodone-acetaminophen           Current Medications (07/09/2016):  This is the current hospital active medication list Current Facility-Administered Medications  Medication Dose Route Frequency Provider Last Rate Last Dose  . acetaminophen (TYLENOL) tablet 650 mg  650 mg Oral Q6H PRN Rogue Bussing, MD   650 mg at 07/09/16 [id number]   Or  . acetaminophen (TYLENOL) suppository 650 mg  650 mg Rectal Q6H PRN Rogue Bussing, MD      . aspirin chewable tablet 81 mg  81 mg Oral Daily Rogue Bussing, MD   81 mg at 07/09/16 [id number]  . atorvastatin (LIPITOR) tablet 40 mg  40 mg Oral QHS Rogue Bussing, MD   40 mg at 07/08/16 2143  . bisacodyl (DULCOLAX) suppository 10 mg  10 mg Rectal PRN Hillary  Corinda Gubler, MD      . divalproex (DEPAKOTE) DR tablet 500 mg  500 mg Oral BID Rogue Bussing, MD   500 mg at 07/09/16 0943  . doxycycline (VIBRA-TABS) tablet 100 mg  100 mg Oral Q12H Everrett Coombe, MD   100 mg at 07/09/16 0943  . enoxaparin (LOVENOX) injection 40 mg  40 mg Subcutaneous Q24H Rogue Bussing, MD   40 mg at 07/09/16 0942  . finasteride (PROSCAR) tablet 5 mg  5 mg Oral Daily Rogue Bussing, MD   5 mg at 07/09/16 0943  . insulin aspart (novoLOG)  injection 0-9 Units  0-9 Units Subcutaneous TID WC Steve Rattler, DO   2 Units at 07/09/16 [phone]  . insulin glargine (LANTUS) injection 20 Units  20 Units Subcutaneous QHS Rogue Bussing, MD   20 Units at 07/08/16 2143  . lisinopril (PRINIVIL,ZESTRIL) tablet 5 mg  5 mg Oral q morning - 10a Rogue Bussing, MD   5 mg at 07/09/16 [phone]  . magnesium hydroxide (MILK OF MAGNESIA) suspension 30 mL  30 mL Oral Daily PRN Rogue Bussing, MD      . olopatadine (PATANOL) 0.1 % ophthalmic solution 1 drop  1 drop Both Eyes BID Rogue Bussing, MD   1 drop at 07/09/16 0945  . ondansetron (ZOFRAN-ODT) disintegrating tablet 4 mg  4 mg Oral Q8H PRN Rogue Bussing, MD      . pantoprazole (PROTONIX) EC tablet 20 mg  20 mg Oral Daily Rogue Bussing, MD   20 mg at 07/09/16 0943  . senna (SENOKOT) tablet 17.2 mg  2 tablet Oral QHS Rogue Bussing, MD   17.2 mg at 07/08/16 2143  . sodium chloride (OCEAN) 0.65 % nasal spray 2 spray  2 spray Each Nare BID PRN Rogue Bussing, MD      . tamsulosin (FLOMAX) capsule 0.8 mg  0.8 mg Oral Daily Rogue Bussing, MD   0.8 mg at 07/09/16 0942  . venlafaxine XR (EFFEXOR-XR) 24 hr capsule 75 mg  75 mg Oral Daily Rogue Bussing, MD   75 mg at 07/09/16 0942  . vitamin C (ASCORBIC ACID) tablet 1,000 mg  1,000 mg Oral BID Rogue Bussing, MD   1,000 mg at 07/09/16 [id number]     Discharge Medications: Please see discharge summary for a list of discharge medications.  Relevant Imaging Results:  Relevant Lab Results:   Additional Information SSN: [id number]  Truitt Merle, LCSW

## 2016-07-09 NOTE — Progress Notes (Signed)
Spoke with social worker Clarise Cruz and since patient was going via personal vehicle with family, if I needed to print any paperwork she said only the d/c AVS and [facility]. I sent both with wife. IV d/c, patient ready and rolling down with family and the tech to help get him in the vehicle. Family assured me they would have help to get him in the facility when he got there.

## 2016-07-09 NOTE — Progress Notes (Signed)
Report called to [facility] and given to [place].

## 2016-07-11 LAB — CULTURE, BLOOD (ROUTINE X 2)
CULTURE: NO GROWTH
Culture: NO GROWTH

## 2016-07-14 ENCOUNTER — Encounter: Payer: Self-pay | Admitting: Family Medicine

## 2016-07-14 ENCOUNTER — Non-Acute Institutional Stay: Payer: Medicare Other | Admitting: Family Medicine

## 2016-07-14 DIAGNOSIS — M542 Cervicalgia: Secondary | ICD-10-CM | POA: Diagnosis not present

## 2016-07-14 NOTE — Progress Notes (Signed)
[facility] Acute Visit  Reginald Patterson is alone Sources of clinical information for visit is/are patient and past medical records.  HISTORY OF PRESENT ILLNESS: Reginald Patterson is a 76 y.o.  male.    CC: Neck Pain  HPI:  Reginald Patterson notes neck pain. Worse when leaning forward. Unable to quantify how long this has been going on. At first stated it was along anterior neck, but then reported neck was along posterior neck instead. Denies radiation down arm. Denies numbness and tingling. Denies weakness. History of back pain noted. Has been using Tylenol with some relief. Currently followed by physical therapy for mobility issues.  History Patient Active Problem List   Diagnosis Date Noted  . Nursing home resident 07/09/2016  . Healthcare associated bacterial pneumonia 07/08/2016  . Febrile illness   . UTI (urinary tract infection) 07/07/2016  . Pyelonephritis, presumed 07/07/2016  . Vascular dementia without behavioral disturbance   . Constipation 06/27/2016  . Neurogenic bladder, flaccid   . Complicated UTI (urinary tract infection) 08/20/2015  . Colon polyps   . 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 Falls frequently 06/22/2015  . Acute delirium 06/17/2015  . Acute encephalopathy 06/16/2015  . Chronic low back pain 11/24/2014  . Anemia 10/04/2011  . Depression 10/04/2011  . CVA (cerebral infarction) 10/02/2011  . HTN (hypertension) 10/02/2011  . Hyperlipidemia 10/02/2011  . Diabetes mellitus, type 2 ([place]) 10/02/2011    Medications  Current Outpatient Prescriptions:  .  acetaminophen (TYLENOL) 325 MG tablet, Take 650 mg by mouth once., Disp: , Rfl:  .  amoxicillin-clavulanate (AUGMENTIN) 875-125 MG tablet, Take 1 tablet by mouth 2 (two) times daily., Disp: 8 tablet, Rfl: 0 .  Ascorbic Acid (VITAMIN C) 1000 MG tablet, Take 1 tablet (1,000 mg total) by mouth 2 (two) times  daily., Disp: , Rfl:  .  aspirin 81 MG chewable tablet, Chew 81 mg by mouth daily., Disp: , Rfl:  .  atorvastatin (LIPITOR) 40 MG tablet, Take 40 mg by mouth at bedtime., Disp: , Rfl:  .  bisacodyl (DULCOLAX) 10 MG suppository, Place 10 mg rectally as needed for moderate constipation., Disp: , Rfl:  .  Calcium Citrate-Vitamin D (CALCIUM CITRATE + D PO), Take 1,000 mg by mouth daily. For low Ca levels, Disp: , Rfl:  .  cloNIDine (CATAPRES) 0.1 MG tablet, Take 0.1 mg by mouth once., Disp: , Rfl:  .  Cranberry 1000 MG CAPS, Take 1 capsule (1,000 mg total) by mouth 3 (three) times daily., Disp: 30 each, Rfl:  .  divalproex (DEPAKOTE ER) 250 MG 24 hr tablet, Take 2 tablets (500 mg total) by mouth 2 (two) times daily. (Patient not taking: Reported on 07/06/2016), Disp: 60 tablet, Rfl: 0 .  divalproex (DEPAKOTE) 500 MG DR tablet, Take 500 mg by mouth 2 (two) times daily., Disp: , Rfl:  .  finasteride (PROSCAR) 5 MG tablet, Take 5 mg by mouth daily., Disp: , Rfl:  .  ibuprofen (ADVIL,MOTRIN) 200 MG tablet, Take 200 mg by mouth every 6 (six) hours as needed for pain. Reported on 08/20/2015, Disp: , Rfl:  .  Insulin Glargine (LANTUS SOLOSTAR) 100 UNIT/ML SOPN, Inject 35 Units into the skin daily at 10 pm. , Disp: , Rfl:  .  lisinopril (PRINIVIL,ZESTRIL) 5 MG tablet, Take 5 mg by mouth every morning., Disp: , Rfl:  .  magnesium hydroxide (MILK OF MAGNESIA) 400 MG/5ML suspension, Take 30 mLs  by mouth daily as needed for mild constipation., Disp: , Rfl:  .  methenamine (HIPREX) 1 g tablet, Take 1 tablet (1 g total) by mouth 2 (two) times daily with a meal. (Patient not taking: Reported on 07/06/2016), Disp: , Rfl:  .  Multiple Vitamin (MULTIVITAMIN WITH MINERALS) TABS, Take 1 tablet by mouth daily., Disp: , Rfl:  .  Olopatadine HCl 0.2 % SOLN, Place 1 drop into both eyes daily., Disp: , Rfl:  .  ondansetron (ZOFRAN-ODT) 4 MG disintegrating tablet, Take 4 mg by mouth every 8 (eight) hours as needed for nausea or  vomiting., Disp: , Rfl:  .  pantoprazole (PROTONIX) 20 MG tablet, Take 20 mg by mouth daily., Disp: , Rfl:  .  senna (SENOKOT) 8.6 MG TABS tablet, Take 2 tablets by mouth at bedtime., Disp: , Rfl:  .  sodium chloride (OCEAN) 0.65 % SOLN nasal spray, Place 2 sprays into both nostrils 2 (two) times daily as needed for congestion., Disp: , Rfl:  .  Sodium Phosphates (RA SALINE ENEMA) 19-7 GM/118ML ENEM, Place 1 each rectally as needed (for constipation)., Disp: , Rfl:  .  tamsulosin (FLOMAX) 0.4 MG CAPS capsule, Take 0.8 mg by mouth daily., Disp: , Rfl:  .  venlafaxine XR (EFFEXOR-XR) 75 MG 24 hr capsule, Take 75 mg by mouth daily., Disp: , Rfl:  .  Wheat Dextrin (BENEFIBER PO), Take 7.5 mLs by mouth daily. Reported on 08/17/2015, Disp: , Rfl:    There were no vitals filed for this visit.  Wt Readings from Last 3 Encounters:  07/07/16 181 lb 3.5 oz (82.2 kg)  03/22/16 182 lb (82.6 kg)  01/10/16 183 lb (83 kg)   Review of Systems:   General: Denies fevers, chills, weight loss, fatigue, weight gain.  Ears/Nose/Throat: Denies ear pain, throat pain, rhinorrhea, nasal congestion.  Cardiovascular: Denies complaints, chest pains, palpitations, dyspnea on exertion, orthopnea, peripheral edema.  Respiratory: Denies cough, sputum, dyspnea.  Gastrointestinal: Denies abd pain, bloating, constipation, diarrhea.  Genitourinary: Denies dysuria, urinary frequency, discharge.  Musculoskeletal: Denies joint pain, swelling, weakness. Neck Pain. Skin: Denies skin rash or ulcers. Psychiatric: Denies depression, anxiety. Endocrine: Denies weight change.   PHYSICAL EXAM:. General: No acute distress, well nourished, pleasant HEENT:  No scleral icterus, no nasal secretions, Oromucosa moist and no erythema or lesion Neck:  Supple, No JVD, no lymphadenopathy CV:  RRR, no murmur, no ankle swelling RESP: No resp distress or accessory muscle use.  Clear to ausc bilat. No wheezing, no rales, no rhonchi.  ABD:   Soft, Non-tender, non-distended, +bowel sounds, no masses MSK:  No back pain, no joint pain.  No joint swelling or redness Skin:  No significant skin lesions or rash Neurologic:  Cranial nerves 2-12 grossly intact, normal tone in extremities, normal strength in extremities Psych:  Fully oriented, Judgment and insight normal, Memory normal for long and short term, Mood and affect appropriate   Assessment(s):  Musculoskeletal Neck Pain  There are no diagnoses linked to this encounter.  Plan(s): 1.  Ibuprofen 200mg  three times daily as needed + Acetaminophen 500mg  three times daily as needed for neck pain 2.  Discussed adding soft tissue release of cervical spine to Physical Therapy treatment. 3.  OMT Attempted. Unable to cooperate. 4.  Self stretches reviewed. Encouraged to stretch several times throughout the day.  Code Status:     Code Status History    Date Active Date Inactive Code Status Order ID Comments User Context   07/07/2016 10:48 AM 07/09/2016  4:48 PM DNR [id number]  Blane Ohara McDiarmid, MD Inpatient   07/07/2016  1:38 AM 07/07/2016 10:48 AM Full Code [id number]  Rogue Bussing, MD Inpatient   06/16/2015  9:31 PM 06/18/2015 10:11 PM Full Code [id number]  Hosie Poisson, MD Inpatient   04/17/2015 10:27 AM 04/18/2015  3:25 AM Full Code [id number]  Logan Bores, MD HOV    Questions for Most Recent Historical Code Status (Order [id number])    Question Answer Comment   In the event of cardiac or respiratory ARREST Do not call a "code blue"    In the event of cardiac or respiratory ARREST Do not perform Intubation, CPR, defibrillation or ACLS    In the event of cardiac or respiratory ARREST Use medication by any route, position, wound care, and other measures to relive pain and suffering. May use oxygen, suction and manual treatment of airway obstruction as needed for comfort.    Comments DNR status confirmed with patient's wife at bedside.  DNR [facility] status at [facility].         There are no discontinued medications.

## 2016-07-14 NOTE — Addendum Note (Signed)
Addended byWendy Poet, TODD D on: 07/14/2016 11:26 AM   Modules accepted: Level of Service

## 2016-07-25 ENCOUNTER — Encounter: Payer: Self-pay | Admitting: Pharmacist

## 2016-08-07 ENCOUNTER — Non-Acute Institutional Stay (INDEPENDENT_AMBULATORY_CARE_PROVIDER_SITE_OTHER): Payer: Medicare Other | Admitting: Family Medicine

## 2016-08-07 DIAGNOSIS — N390 Urinary tract infection, site not specified: Secondary | ICD-10-CM

## 2016-08-07 DIAGNOSIS — I10 Essential (primary) hypertension: Secondary | ICD-10-CM | POA: Diagnosis not present

## 2016-08-07 DIAGNOSIS — I70209 Unspecified atherosclerosis of native arteries of extremities, unspecified extremity: Secondary | ICD-10-CM | POA: Diagnosis not present

## 2016-08-07 DIAGNOSIS — F0150 Vascular dementia without behavioral disturbance: Secondary | ICD-10-CM | POA: Diagnosis not present

## 2016-08-07 DIAGNOSIS — R05 Cough: Secondary | ICD-10-CM | POA: Diagnosis not present

## 2016-08-07 DIAGNOSIS — D649 Anemia, unspecified: Secondary | ICD-10-CM | POA: Diagnosis not present

## 2016-08-07 DIAGNOSIS — R410 Disorientation, unspecified: Secondary | ICD-10-CM | POA: Diagnosis not present

## 2016-08-07 DIAGNOSIS — R404 Transient alteration of awareness: Secondary | ICD-10-CM

## 2016-08-07 DIAGNOSIS — E860 Dehydration: Secondary | ICD-10-CM | POA: Diagnosis not present

## 2016-08-07 DIAGNOSIS — Z789 Other specified health status: Secondary | ICD-10-CM

## 2016-08-07 DIAGNOSIS — R0989 Other specified symptoms and signs involving the circulatory and respiratory systems: Secondary | ICD-10-CM | POA: Diagnosis not present

## 2016-08-07 DIAGNOSIS — R319 Hematuria, unspecified: Secondary | ICD-10-CM | POA: Diagnosis not present

## 2016-08-07 DIAGNOSIS — Z593 Problems related to living in residential institution: Secondary | ICD-10-CM

## 2016-08-07 DIAGNOSIS — R531 Weakness: Secondary | ICD-10-CM | POA: Diagnosis not present

## 2016-08-07 DIAGNOSIS — I7090 Unspecified atherosclerosis: Secondary | ICD-10-CM

## 2016-08-07 LAB — TSH: TSH: 2.03 u[IU]/mL (ref 0.41–5.90)

## 2016-08-07 LAB — BASIC METABOLIC PANEL
BUN: 20 mg/dL (ref 4–21)
CREATININE: 1 mg/dL (ref 0.6–1.3)
Glucose: 247 mg/dL
POTASSIUM: 4.4 mmol/L (ref 3.4–5.3)
Sodium: 138 mmol/L (ref 137–147)

## 2016-08-07 LAB — CBC AND DIFFERENTIAL
HCT: 40 % — AB (ref 41–53)
Hemoglobin: 13.1 g/dL — AB (ref 13.5–17.5)
Platelets: 225 10*3/uL (ref 150–399)
WBC: 7.3 10*3/mL

## 2016-08-07 NOTE — Progress Notes (Signed)
[facility] Acute Visit  Reginald Patterson is accompanied by patient and wife Sources of clinical information for visit is/are patient and spouse/SO.  HISTORY OF PRESENT ILLNESS: Reginald Patterson is a 76 y.o.  male.    Chief Complaint: Altered Mental Status  HPI: Received notification from nursing that Reginald Patterson has been altered and more drowsy than normal. Went to evaluate and discussed with wife. She reports that over the last 2-3 days, he has been sleeping more (18-20hr per day) and more and seeming more confused. Notes she became more worried this morning when he would not respond to her questions and would only stare at her. Had a friend who is a Palliative care physician come by and they recommended contacting the geriatric team for evaluation and obtaining lab work.   Reginald Patterson initially complained of abdominal pain, but then denied pain. Reports he does not feel well, but unable to clarify in what way. Denies dysuria.  History Patient Active Problem List   Diagnosis Date Noted  . Nursing home resident 07/09/2016  . Healthcare associated bacterial pneumonia 07/08/2016  . Febrile illness   . UTI (urinary tract infection) 07/07/2016  . Pyelonephritis, presumed 07/07/2016  . Vascular dementia without behavioral disturbance   . Constipation 06/27/2016  . Neurogenic bladder, flaccid   . Complicated UTI (urinary tract infection) 08/20/2015  . Colon polyps   . 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 Falls frequently 06/22/2015  . Acute delirium 06/17/2015  . Acute encephalopathy 06/16/2015  . Chronic low back pain 11/24/2014  . Anemia 10/04/2011  . Depression 10/04/2011  . CVA (cerebral infarction) 10/02/2011  . HTN (hypertension) 10/02/2011  . Hyperlipidemia 10/02/2011  . Diabetes mellitus, type 2 ([place]) 10/02/2011   Medications  Current Outpatient Prescriptions:  .   acetaminophen (TYLENOL) 325 MG tablet, Take 500 mg by mouth 3 (three) times daily. Take with ibuprofen, Disp: , Rfl:  .  amoxicillin-clavulanate (AUGMENTIN) 875-125 MG tablet, Take 1 tablet by mouth 2 (two) times daily. (Patient not taking: Reported on 07/25/2016), Disp: 8 tablet, Rfl: 0 .  Ascorbic Acid (VITAMIN C) 1000 MG tablet, Take 1 tablet (1,000 mg total) by mouth 2 (two) times daily., Disp: , Rfl:  .  aspirin 81 MG chewable tablet, Chew 81 mg by mouth daily., Disp: , Rfl:  .  atorvastatin (LIPITOR) 40 MG tablet, Take 40 mg by mouth at bedtime., Disp: , Rfl:  .  bisacodyl (DULCOLAX) 10 MG suppository, Place 10 mg rectally as needed for moderate constipation., Disp: , Rfl:  .  Calcium Citrate-Vitamin D (CALCIUM CITRATE + D PO), Take 1,000 mg by mouth daily. For low Ca levels, Disp: , Rfl:  .  cloNIDine (CATAPRES) 0.1 MG tablet, Take 0.1 mg by mouth once., Disp: , Rfl:  .  Cranberry 1000 MG CAPS, Take 1 capsule (1,000 mg total) by mouth 3 (three) times daily. (Patient taking differently: Take 500 mg by mouth 3 (three) times daily. ), Disp: 30 each, Rfl:  .  divalproex (DEPAKOTE ER) 250 MG 24 hr tablet, Take 2 tablets (500 mg total) by mouth 2 (two) times daily. (Patient not taking: Reported on 07/25/2016), Disp: 60 tablet, Rfl: 0 .  divalproex (DEPAKOTE) 500 MG DR tablet, Take 500 mg by mouth 2 (two) times daily., Disp: , Rfl:  .  finasteride (PROSCAR) 5 MG tablet, Take 5 mg by mouth daily., Disp: , Rfl:  .  ibuprofen (ADVIL,MOTRIN)  200 MG tablet, Take 200 mg by mouth 3 (three) times daily. Take with APAP, Disp: , Rfl:  .  Insulin Glargine (LANTUS SOLOSTAR) 100 UNIT/ML SOPN, Inject 35 Units into the skin daily at 10 pm. , Disp: , Rfl:  .  lisinopril (PRINIVIL,ZESTRIL) 5 MG tablet, Take 5 mg by mouth every morning., Disp: , Rfl:  .  magnesium hydroxide (MILK OF MAGNESIA) 400 MG/5ML suspension, Take 30 mLs by mouth daily as needed for mild constipation., Disp: , Rfl:  .  methenamine (HIPREX) 1 g  tablet, Take 1 tablet (1 g total) by mouth 2 (two) times daily with a meal. (Patient not taking: Reported on 07/25/2016), Disp: , Rfl:  .  Multiple Vitamin (MULTIVITAMIN WITH MINERALS) TABS, Take 1 tablet by mouth daily., Disp: , Rfl:  .  Olopatadine HCl 0.2 % SOLN, Place 1 drop into both eyes daily., Disp: , Rfl:  .  ondansetron (ZOFRAN-ODT) 4 MG disintegrating tablet, Take 4 mg by mouth every 8 (eight) hours as needed for nausea or vomiting., Disp: , Rfl:  .  pantoprazole (PROTONIX) 20 MG tablet, Take 20 mg by mouth daily., Disp: , Rfl:  .  senna (SENOKOT) 8.6 MG TABS tablet, Take 2 tablets by mouth at bedtime., Disp: , Rfl:  .  sodium chloride (OCEAN) 0.65 % SOLN nasal spray, Place 2 sprays into both nostrils 2 (two) times daily as needed for congestion., Disp: , Rfl:  .  Sodium Phosphates (RA SALINE ENEMA) 19-7 GM/118ML ENEM, Place 1 each rectally as needed (for constipation)., Disp: , Rfl:  .  tamsulosin (FLOMAX) 0.4 MG CAPS capsule, Take 0.8 mg by mouth daily., Disp: , Rfl:  .  venlafaxine XR (EFFEXOR-XR) 75 MG 24 hr capsule, Take 75 mg by mouth daily., Disp: , Rfl:  .  Wheat Dextrin (BENEFIBER PO), Take 7.5 mLs by mouth daily. Reported on 08/17/2015, Disp: , Rfl:   Vitals: Heart Rate 64 O2 Saturation 94% Temperature 98.83F Blood Pressure 120/60  Wt Readings from Last 3 Encounters:  07/07/16 181 lb 3.5 oz (82.2 kg)  03/22/16 182 lb (82.6 kg)  01/10/16 183 lb (83 kg)   Review of Systems:   General: Denies fevers, chills. Notes general malaise.  Ears/Nose/Throat: Denies ear pain, throat pain, rhinorrhea, nasal congestion.  Cardiovascular: Denies complaints, chest pains, palpitations, dyspnea on exertion, orthopnea, peripheral edema.  Respiratory: Denies cough, sputum, dyspnea.  Gastrointestinal: Denies abd pain, bloating, constipation, diarrhea.  Genitourinary: Denies dysuria, urinary frequency, discharge.  Musculoskeletal: Denies joint pain, swelling. Weak. Skin: Denies skin rash or  ulcers. Psychiatric: Denies depression, anxiety. Confused. Endocrine: Denies weight change.   PHYSICAL EXAM:. General: No acute distress, well nourished, pleasant.  HEENT:  No scleral icterus, no nasal secretions, Oromucosa moist and no erythema or lesion Neck:  Supple, no lymphadenopathy CV:  RRR, no murmur, no ankle swelling RESP: No resp distress or accessory muscle use.  Crackles noted over right base. ABD:  Soft, Non-tender, non-distended, +bowel sounds, no masses MSK:  No back pain, no joint pain.  No joint swelling or redness Skin:  No significant skin lesions or rash Neurologic:  Tremor noted with finger-to-nose. Moving all extremities. Mild asterixis noted in right hand. Psych:  Oriented to person and place. Initially responsive and pleasant, however became less responsive and did not answer questions when sitting up--again became responsive after returning to supine position.   Assessment(s):   Altered Mental Status with Tremor.  Differential includes infectious etiology (Pneumonia vs. UTI), Encephalopathy (increased risk for ammonia toxicity  given Valproate)  Plan(s): 1.  Obtain TSH, CMP, CBC with differential, Urinalysis, Urine Culture, Blood Culture, Valproic Acid Level 2.  Obtain 2v Chest Xray 3.  Will initiate Levaquin 750mg  daily for 5 days to cover pulmonary and urinary sources   Code Status:     Code Status History    Date Active Date Inactive Code Status Order ID Comments User Context   07/07/2016 10:48 AM 07/09/2016  4:48 PM DNR [id number]  Blane Ohara McDiarmid, MD Inpatient   07/07/2016  1:38 AM 07/07/2016 10:48 AM Full Code [id number]  Rogue Bussing, MD Inpatient   06/16/2015  9:31 PM 06/18/2015 10:11 PM Full Code [id number]  Hosie Poisson, MD Inpatient   04/17/2015 10:27 AM 04/18/2015  3:25 AM Full Code [id number]  Logan Bores, MD HOV    Questions for Most Recent Historical Code Status (Order [id number])    Question Answer Comment   In the event of cardiac or  respiratory ARREST Do not call a "code blue"    In the event of cardiac or respiratory ARREST Do not perform Intubation, CPR, defibrillation or ACLS    In the event of cardiac or respiratory ARREST Use medication by any route, position, wound care, and other measures to relive pain and suffering. May use oxygen, suction and manual treatment of airway obstruction as needed for comfort.    Comments DNR status confirmed with patient's wife at bedside.  DNR is patient's status at [facility].

## 2016-08-08 ENCOUNTER — Encounter: Payer: Self-pay | Admitting: Family Medicine

## 2016-08-08 ENCOUNTER — Telehealth: Payer: Self-pay | Admitting: Internal Medicine

## 2016-08-08 ENCOUNTER — Telehealth: Payer: Self-pay | Admitting: Family Medicine

## 2016-08-08 LAB — VALPROIC ACID LEVEL: Valproic Acid: 61

## 2016-08-08 LAB — URINALYSIS, COMPLETE (UACMP) WITH MICROSCOPIC
BLOOD UA: NEGATIVE
Nitrite, UA: NEGATIVE

## 2016-08-08 NOTE — Telephone Encounter (Signed)
**  After Hours/ Emergency Line Call*  Received a call to report that Ilean China is supposed to be receiving fluids via hypodermoclysis infusion. The nursing staff are having a hard time administering fluids this way. They are requesting a verbal order to give the fluids via an IV because he has good veins. Verbal order given. They will call back with any issues.  Will forward to Dr. Gerlean Ren.  Evette Doffing, MD PGY-2, [facility]

## 2016-08-08 NOTE — Telephone Encounter (Signed)
Family Medicine After hours phone call  Received call from [place]. Patient has been having some increased fatigue of late. No significant changes this morning compared to yesterday. Nurse noted some decreased urine output during in and out cath. Also patient w/ decreased PO yesterday.  I asked that she try to push PO fluids this AM. I would pass this information on to day team. If UOP continued to decline then strong consideration for transfer to ED for IVF, but this could likely wait due to recent BMP of normal Creatinine.  Elberta Leatherwood, MD,MS,  PGY3 08/08/2016 7:12 AM

## 2016-08-08 NOTE — Addendum Note (Signed)
Addended by: Lissa Morales D on: 08/08/2016 04:41 PM   Modules accepted: Orders, Level of Service

## 2016-08-08 NOTE — Progress Notes (Signed)
Patient ID: Reginald Patterson, male   DOB: [birth year], 76 y.o.   MRN: [id number] Reginald Patterson is accompanied by wife Sources of clinical information for visit is/are patient, spouse/SO, nursing home, past medical records and NH floor RN. Nursing assessment for this office visit was reviewed with the patient for accuracy and revision.  I have interviewed and examined the patient with Dr Gerlean Ren.  I have discussed the case and verified the key findings with Dr. Gerlean Ren.   I agree with her assessments and plans as documented in her visit note.   A/P 1. Acute Delirium - While has not returned to his usual level of function after recent hospitalization for pneumonia, his wife noted an acute change in his level of consciousness and thought processing along with decline in his participation in performing his ADLs. - His level of awareness fluctuates during the day. - His urine output with his scheduled I&O caths have had less uop than usual per nursing staff in last two days. -  His body weight has dropped about 5 lbs in last 2 to 3 days. - He did not eat breakfast today but he did eat some lunch with his wife's assistance.  - His SBP was 110/60 with manual cuff per Sharmila Wrobleski this afternoon with HR 64 reg. Usual BPs 130-150/60-70 range.  - Physical exam this afternoon significant for somnolence that required loud voice to bring Mr Branagan to a traansient alertness to follow one-step commands successfully.  No evidence of discomfort on exam nor labored breathing.  Appears comfortable.  Moving all limbs spontaneously.  - CMET and CBC unremarkable for indicating adverse effects from delirium or source of delirium.  [facility]CXR at [facility] did not show infiltrate.   Working explanation for acute delirium is another acute UTI (catheter associated) given pyuria and history of similar presentations in past when patient was also thought to have UTI that responded to antibiotics. Ddx: New recurrent CVA, nonconvulsive  seizure, post-ictal state, medication-related Divalproex though serum level is in therapeutic range, Hyperammonemia from valproic acid though I learned from Dr Gerlean Ren that his venous ammonia level was in normal range, fecal retention (abdominal exam unremarkable).   Patient started on Levaquin 750 mg daily on 08/07/16. Divalproex decreased from 500 mg BID to 250 mg BID.  CBC and BMET for tomorrow AM. Start of hypodermoclysis 50 ml/ht [place] of NS for 48 hours to assist in maintenance of hydration during acute illness.   I spoke with Mrs Gunnoe at bedside.  Her preference is to manage her husband at the NH in order to avoid the stress of transfers and transitions on her husband necessitated by ED visit with possible hospital admission following.  This seems reasonable.  Mr Jurgens has a DNR status.   Mrs Hutmacher would consider transfer of her husband to ED for evaluation/management/triage (Head CT, IVF resuscitation) if  became hemodynamically unstable, e.g. SBP less than 100 or HR > 100 or HR < 50, unresponsive to voice or fails to show some improvement on Levaquin in next two days, becomes acutely agitated requiring close nursing care or unable to maintain hydration orally.

## 2016-08-09 ENCOUNTER — Telehealth: Payer: Self-pay | Admitting: Internal Medicine

## 2016-08-09 DIAGNOSIS — N319 Neuromuscular dysfunction of bladder, unspecified: Secondary | ICD-10-CM | POA: Diagnosis not present

## 2016-08-09 DIAGNOSIS — I6930 Unspecified sequelae of cerebral infarction: Secondary | ICD-10-CM | POA: Diagnosis not present

## 2016-08-09 DIAGNOSIS — E1122 Type 2 diabetes mellitus with diabetic chronic kidney disease: Secondary | ICD-10-CM | POA: Diagnosis not present

## 2016-08-09 NOTE — Telephone Encounter (Signed)
[place] Call:   Received call that patient was on Levaquin 750 mg BID for UTI treatment. His urine culture resulted with >100k colonies of GNR that were sensitive to Levaquin. Will continue Levaquin for treatment and send to Dr. McDiarmid and Dr. Michail Jewels resident.   Phill Myron, D.O. 08/09/2016, 2:44 PM PGY-2, [place]

## 2016-08-11 ENCOUNTER — Encounter: Payer: Self-pay | Admitting: Family Medicine

## 2016-08-11 DIAGNOSIS — R531 Weakness: Secondary | ICD-10-CM | POA: Diagnosis not present

## 2016-08-11 LAB — URINALYSIS
BLOOD UA: NEGATIVE
KETONES UA: POSITIVE — AB
Nitrite, UA: NEGATIVE
Specific Gravity, Urine: 1.025

## 2016-08-11 LAB — COMPLETE METABOLIC PANEL WITH GFR
ALT: 19
AST: 17 U/L
Albumin: 3.7
BUN: 21 mg/dL — ABNORMAL HIGH (ref 4–21)
CALCIUM: 9.4 mg/dL
CREATININE: 0.99
GLUCOSE: 247 — AB
POTASSIUM: 4.4 mmol/L
SODIUM: 138

## 2016-08-11 LAB — CBC WITH DIFFERENTIAL
HEMATOCRIT: 40 % — AB
Hemoglobin: 13.1 — ABNORMAL LOW
MCV: 86.8
Platelets: 225
WBC: 7.3

## 2016-08-11 LAB — AMMONIA: AMMONIA: 57

## 2016-08-19 DIAGNOSIS — N3020 Other chronic cystitis without hematuria: Secondary | ICD-10-CM | POA: Diagnosis not present

## 2016-08-25 DIAGNOSIS — R531 Weakness: Secondary | ICD-10-CM | POA: Diagnosis not present

## 2016-09-01 ENCOUNTER — Other Ambulatory Visit: Payer: Self-pay

## 2016-09-01 DIAGNOSIS — R531 Weakness: Secondary | ICD-10-CM | POA: Diagnosis not present

## 2016-09-11 DIAGNOSIS — R531 Weakness: Secondary | ICD-10-CM | POA: Diagnosis not present

## 2016-09-24 DIAGNOSIS — R319 Hematuria, unspecified: Secondary | ICD-10-CM | POA: Diagnosis not present

## 2016-09-24 DIAGNOSIS — D649 Anemia, unspecified: Secondary | ICD-10-CM | POA: Diagnosis not present

## 2016-09-24 DIAGNOSIS — I6930 Unspecified sequelae of cerebral infarction: Secondary | ICD-10-CM | POA: Diagnosis not present

## 2016-09-24 DIAGNOSIS — N390 Urinary tract infection, site not specified: Secondary | ICD-10-CM | POA: Diagnosis not present

## 2016-09-24 DIAGNOSIS — I10 Essential (primary) hypertension: Secondary | ICD-10-CM | POA: Diagnosis not present

## 2016-09-24 DIAGNOSIS — E1122 Type 2 diabetes mellitus with diabetic chronic kidney disease: Secondary | ICD-10-CM | POA: Diagnosis not present

## 2016-09-25 DIAGNOSIS — K219 Gastro-esophageal reflux disease without esophagitis: Secondary | ICD-10-CM | POA: Diagnosis not present

## 2016-09-25 DIAGNOSIS — R1312 Dysphagia, oropharyngeal phase: Secondary | ICD-10-CM | POA: Diagnosis not present

## 2016-09-25 DIAGNOSIS — R2681 Unsteadiness on feet: Secondary | ICD-10-CM | POA: Diagnosis not present

## 2016-09-26 DIAGNOSIS — K219 Gastro-esophageal reflux disease without esophagitis: Secondary | ICD-10-CM | POA: Diagnosis not present

## 2016-09-26 DIAGNOSIS — R1312 Dysphagia, oropharyngeal phase: Secondary | ICD-10-CM | POA: Diagnosis not present

## 2016-09-26 DIAGNOSIS — R2681 Unsteadiness on feet: Secondary | ICD-10-CM | POA: Diagnosis not present

## 2016-09-27 DIAGNOSIS — K219 Gastro-esophageal reflux disease without esophagitis: Secondary | ICD-10-CM | POA: Diagnosis not present

## 2016-09-27 DIAGNOSIS — R1312 Dysphagia, oropharyngeal phase: Secondary | ICD-10-CM | POA: Diagnosis not present

## 2016-09-27 DIAGNOSIS — R2681 Unsteadiness on feet: Secondary | ICD-10-CM | POA: Diagnosis not present

## 2016-09-29 DIAGNOSIS — K219 Gastro-esophageal reflux disease without esophagitis: Secondary | ICD-10-CM | POA: Diagnosis not present

## 2016-09-29 DIAGNOSIS — R1312 Dysphagia, oropharyngeal phase: Secondary | ICD-10-CM | POA: Diagnosis not present

## 2016-09-29 DIAGNOSIS — R2681 Unsteadiness on feet: Secondary | ICD-10-CM | POA: Diagnosis not present

## 2016-09-30 DIAGNOSIS — R2681 Unsteadiness on feet: Secondary | ICD-10-CM | POA: Diagnosis not present

## 2016-09-30 DIAGNOSIS — R1312 Dysphagia, oropharyngeal phase: Secondary | ICD-10-CM | POA: Diagnosis not present

## 2016-09-30 DIAGNOSIS — K219 Gastro-esophageal reflux disease without esophagitis: Secondary | ICD-10-CM | POA: Diagnosis not present

## 2016-10-02 DIAGNOSIS — R2681 Unsteadiness on feet: Secondary | ICD-10-CM | POA: Diagnosis not present

## 2016-10-02 DIAGNOSIS — R1312 Dysphagia, oropharyngeal phase: Secondary | ICD-10-CM | POA: Diagnosis not present

## 2016-10-02 DIAGNOSIS — K219 Gastro-esophageal reflux disease without esophagitis: Secondary | ICD-10-CM | POA: Diagnosis not present

## 2016-10-03 DIAGNOSIS — R2681 Unsteadiness on feet: Secondary | ICD-10-CM | POA: Diagnosis not present

## 2016-10-03 DIAGNOSIS — K219 Gastro-esophageal reflux disease without esophagitis: Secondary | ICD-10-CM | POA: Diagnosis not present

## 2016-10-03 DIAGNOSIS — R1312 Dysphagia, oropharyngeal phase: Secondary | ICD-10-CM | POA: Diagnosis not present

## 2016-10-06 DIAGNOSIS — R2681 Unsteadiness on feet: Secondary | ICD-10-CM | POA: Diagnosis not present

## 2016-10-06 DIAGNOSIS — R531 Weakness: Secondary | ICD-10-CM | POA: Diagnosis not present

## 2016-10-06 DIAGNOSIS — R1312 Dysphagia, oropharyngeal phase: Secondary | ICD-10-CM | POA: Diagnosis not present

## 2016-10-06 DIAGNOSIS — K219 Gastro-esophageal reflux disease without esophagitis: Secondary | ICD-10-CM | POA: Diagnosis not present

## 2016-10-07 DIAGNOSIS — R1312 Dysphagia, oropharyngeal phase: Secondary | ICD-10-CM | POA: Diagnosis not present

## 2016-10-07 DIAGNOSIS — R2681 Unsteadiness on feet: Secondary | ICD-10-CM | POA: Diagnosis not present

## 2016-10-07 DIAGNOSIS — K219 Gastro-esophageal reflux disease without esophagitis: Secondary | ICD-10-CM | POA: Diagnosis not present

## 2016-10-08 ENCOUNTER — Emergency Department (HOSPITAL_COMMUNITY): Payer: Medicare Other

## 2016-10-08 ENCOUNTER — Encounter (HOSPITAL_COMMUNITY): Payer: Self-pay | Admitting: Emergency Medicine

## 2016-10-08 ENCOUNTER — Other Ambulatory Visit: Payer: Self-pay | Admitting: Family Medicine

## 2016-10-08 ENCOUNTER — Emergency Department (HOSPITAL_COMMUNITY)
Admission: EM | Admit: 2016-10-08 | Discharge: 2016-10-09 | Disposition: A | Discharge status: Home / Self Care | Payer: Medicare Other | Attending: Emergency Medicine | Admitting: Emergency Medicine

## 2016-10-08 ENCOUNTER — Telehealth: Payer: Self-pay | Admitting: Internal Medicine

## 2016-10-08 DIAGNOSIS — N189 Chronic kidney disease, unspecified: Secondary | ICD-10-CM | POA: Insufficient documentation

## 2016-10-08 DIAGNOSIS — Z79899 Other long term (current) drug therapy: Secondary | ICD-10-CM | POA: Insufficient documentation

## 2016-10-08 DIAGNOSIS — Z794 Long term (current) use of insulin: Secondary | ICD-10-CM | POA: Diagnosis not present

## 2016-10-08 DIAGNOSIS — E1122 Type 2 diabetes mellitus with diabetic chronic kidney disease: Secondary | ICD-10-CM | POA: Insufficient documentation

## 2016-10-08 DIAGNOSIS — Z7982 Long term (current) use of aspirin: Secondary | ICD-10-CM | POA: Diagnosis not present

## 2016-10-08 DIAGNOSIS — N390 Urinary tract infection, site not specified: Secondary | ICD-10-CM | POA: Insufficient documentation

## 2016-10-08 DIAGNOSIS — I129 Hypertensive chronic kidney disease with stage 1 through stage 4 chronic kidney disease, or unspecified chronic kidney disease: Secondary | ICD-10-CM | POA: Insufficient documentation

## 2016-10-08 DIAGNOSIS — Z87891 Personal history of nicotine dependence: Secondary | ICD-10-CM | POA: Diagnosis not present

## 2016-10-08 DIAGNOSIS — Z8673 Personal history of transient ischemic attack (TIA), and cerebral infarction without residual deficits: Secondary | ICD-10-CM | POA: Insufficient documentation

## 2016-10-08 DIAGNOSIS — M25511 Pain in right shoulder: Secondary | ICD-10-CM | POA: Diagnosis not present

## 2016-10-08 DIAGNOSIS — I6789 Other cerebrovascular disease: Secondary | ICD-10-CM | POA: Diagnosis not present

## 2016-10-08 DIAGNOSIS — R410 Disorientation, unspecified: Secondary | ICD-10-CM | POA: Diagnosis present

## 2016-10-08 DIAGNOSIS — W19XXXA Unspecified fall, initial encounter: Secondary | ICD-10-CM | POA: Diagnosis not present

## 2016-10-08 DIAGNOSIS — R0602 Shortness of breath: Secondary | ICD-10-CM | POA: Diagnosis not present

## 2016-10-08 LAB — COMPREHENSIVE METABOLIC PANEL
ALBUMIN: 3.6 g/dL (ref 3.5–5.0)
ALT: 23 U/L (ref 17–63)
AST: 22 U/L (ref 15–41)
Alkaline Phosphatase: 83 U/L (ref 38–126)
Anion gap: 6 (ref 5–15)
BUN: 21 mg/dL — ABNORMAL HIGH (ref 6–20)
CO2: 27 mmol/L (ref 22–32)
Calcium: 9.8 mg/dL (ref 8.9–10.3)
Chloride: 105 mmol/L (ref 101–111)
Creatinine, Ser: 1.29 mg/dL — ABNORMAL HIGH (ref 0.61–1.24)
GFR, EST NON AFRICAN AMERICAN: 53 mL/min — AB (ref >60)
Glucose, Bld: 232 mg/dL — ABNORMAL HIGH (ref 65–99)
POTASSIUM: 3.9 mmol/L (ref 3.5–5.1)
SODIUM: 138 mmol/L (ref 135–145)
Total Bilirubin: 0.3 mg/dL (ref 0.3–1.2)
Total Protein: 6.4 g/dL — ABNORMAL LOW (ref 6.5–8.1)

## 2016-10-08 LAB — URINALYSIS, ROUTINE W REFLEX MICROSCOPIC
Bilirubin Urine: NEGATIVE
HGB URINE DIPSTICK: NEGATIVE
KETONES UR: 5 mg/dL — AB
NITRITE: NEGATIVE
PH: 5 (ref 5.0–8.0)
Protein, ur: 30 mg/dL — AB
Specific Gravity, Urine: 1.02 (ref 1.005–1.030)

## 2016-10-08 LAB — CBG MONITORING, ED: Glucose-Capillary: 262 mg/dL — ABNORMAL HIGH (ref 65–99)

## 2016-10-08 LAB — CBC
HEMATOCRIT: 40.9 % (ref 39.0–52.0)
HEMOGLOBIN: 13.7 g/dL (ref 13.0–17.0)
MCH: 28.7 pg (ref 26.0–34.0)
MCHC: 33.5 g/dL (ref 30.0–36.0)
MCV: 85.7 fL (ref 78.0–100.0)
Platelets: 279 10*3/uL (ref 150–400)
RBC: 4.77 MIL/uL (ref 4.22–5.81)
RDW: 14.2 % (ref 11.5–15.5)
WBC: 10.9 10*3/uL — AB (ref 4.0–10.5)

## 2016-10-08 MED ORDER — DEXTROSE 5 % IV SOLN: 1.0000 g | Freq: Once | INTRAVENOUS | Status: DC

## 2016-10-08 MED ORDER — METHYLPHENIDATE HCL 5 MG PO TABS: 2.5000 mg | ORAL_TABLET | Freq: Every day | ORAL | 0 refills | Status: DC

## 2016-10-08 MED ORDER — SODIUM CHLORIDE 0.9 % IV BOLUS (SEPSIS)
1000.0000 mL | Freq: Once | INTRAVENOUS | Status: AC
  Administered 2016-10-08: 1000 mL via INTRAVENOUS

## 2016-10-08 NOTE — ED Notes (Signed)
Patient transported to X-ray 

## 2016-10-08 NOTE — Telephone Encounter (Signed)
On call resident contacted me about patient being combative at the [facility]. Otherwise he is stable clinically. I spoke with the NH attending Dr. McDiarmid who stated that he fell early today. He recommended ED evaluation, likely needing CT head. If cleared by ED attending after evaluation he may return to the NH.

## 2016-10-08 NOTE — ED Triage Notes (Signed)
Pt BIB EMS from [facility]. PCP ordered a CT d/t incr'd confusion, agitation since 0700 today. Facility reports two falls today, EMS reports pt lost balance. Denies having struck head. Pt has no specific complaints, other than wanting to leave facility. Per EMS pt with hx of dementia and at baseline when calm. Oriented to person, place.
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
## 2016-10-09 ENCOUNTER — Telehealth: Payer: Self-pay | Admitting: Internal Medicine

## 2016-10-09 ENCOUNTER — Emergency Department (HOSPITAL_COMMUNITY)
Admission: EM | Admit: 2016-10-09 | Discharge: 2016-10-09 | Disposition: A | Discharge status: Home / Self Care | Payer: Medicare Other | Source: Home / Self Care | Attending: Emergency Medicine | Admitting: Emergency Medicine

## 2016-10-09 ENCOUNTER — Encounter (HOSPITAL_COMMUNITY): Payer: Self-pay | Admitting: Emergency Medicine

## 2016-10-09 DIAGNOSIS — K219 Gastro-esophageal reflux disease without esophagitis: Secondary | ICD-10-CM | POA: Diagnosis not present

## 2016-10-09 DIAGNOSIS — E119 Type 2 diabetes mellitus without complications: Secondary | ICD-10-CM | POA: Insufficient documentation

## 2016-10-09 DIAGNOSIS — Z8673 Personal history of transient ischemic attack (TIA), and cerebral infarction without residual deficits: Secondary | ICD-10-CM

## 2016-10-09 DIAGNOSIS — I129 Hypertensive chronic kidney disease with stage 1 through stage 4 chronic kidney disease, or unspecified chronic kidney disease: Secondary | ICD-10-CM

## 2016-10-09 DIAGNOSIS — R2681 Unsteadiness on feet: Secondary | ICD-10-CM | POA: Diagnosis not present

## 2016-10-09 DIAGNOSIS — N390 Urinary tract infection, site not specified: Secondary | ICD-10-CM

## 2016-10-09 DIAGNOSIS — N189 Chronic kidney disease, unspecified: Secondary | ICD-10-CM | POA: Insufficient documentation

## 2016-10-09 DIAGNOSIS — Z87891 Personal history of nicotine dependence: Secondary | ICD-10-CM

## 2016-10-09 DIAGNOSIS — R1312 Dysphagia, oropharyngeal phase: Secondary | ICD-10-CM | POA: Diagnosis not present

## 2016-10-09 DIAGNOSIS — Z79899 Other long term (current) drug therapy: Secondary | ICD-10-CM

## 2016-10-09 DIAGNOSIS — Z794 Long term (current) use of insulin: Secondary | ICD-10-CM

## 2016-10-09 DIAGNOSIS — E1122 Type 2 diabetes mellitus with diabetic chronic kidney disease: Secondary | ICD-10-CM | POA: Diagnosis not present

## 2016-10-09 DIAGNOSIS — Z7982 Long term (current) use of aspirin: Secondary | ICD-10-CM | POA: Insufficient documentation

## 2016-10-09 MED ORDER — LISINOPRIL 5 MG PO TABS: 5.0000 mg | ORAL_TABLET | Freq: Every day | ORAL | 0 refills | Status: AC

## 2016-10-09 MED ORDER — DIVALPROEX SODIUM 500 MG PO DR TAB: 500.0000 mg | DELAYED_RELEASE_TABLET | Freq: Two times a day (BID) | ORAL | 0 refills | Status: AC

## 2016-10-09 MED ORDER — LEVOFLOXACIN 750 MG PO TABS: 750.0000 mg | ORAL_TABLET | Freq: Every day | ORAL | 0 refills | Status: AC

## 2016-10-09 MED ORDER — SENNOSIDES-DOCUSATE SODIUM 8.6-50 MG PO TABS: 1.0000 | ORAL_TABLET | Freq: Every day | ORAL | 0 refills | Status: AC

## 2016-10-09 MED ORDER — METHYLPHENIDATE HCL 5 MG PO TABS: 2.5000 mg | ORAL_TABLET | Freq: Every day | ORAL | 0 refills | Status: AC

## 2016-10-09 MED ORDER — LEVOFLOXACIN IN D5W 750 MG/150ML IV SOLN
750.0000 mg | Freq: Once | INTRAVENOUS | Status: AC
  Administered 2016-10-09: 750 mg via INTRAVENOUS
  Filled 2016-10-09: qty 150

## 2016-10-09 MED ORDER — TAMSULOSIN HCL 0.4 MG PO CAPS: 0.4000 mg | ORAL_CAPSULE | Freq: Every day | ORAL | 0 refills | Status: AC

## 2016-10-09 MED ORDER — INSULIN GLARGINE 100 UNIT/ML SOLOSTAR PEN: PEN_INJECTOR | SUBCUTANEOUS | 11 refills | Status: AC

## 2016-10-09 MED ORDER — PANTOPRAZOLE SODIUM 20 MG PO TBEC: 20.0000 mg | DELAYED_RELEASE_TABLET | Freq: Every day | ORAL | 0 refills | Status: AC

## 2016-10-09 MED ORDER — LEVOFLOXACIN 750 MG PO TABS
750.0000 mg | ORAL_TABLET | Freq: Every day | ORAL | 0 refills | Status: DC
Start: 2016-10-09 — End: 2016-10-09

## 2016-10-09 MED ORDER — VENLAFAXINE HCL ER 75 MG PO CP24: 75.0000 mg | ORAL_CAPSULE | Freq: Every day | ORAL | 0 refills | Status: AC

## 2016-10-09 MED ORDER — FINASTERIDE 5 MG PO TABS: 5.0000 mg | ORAL_TABLET | Freq: Every day | ORAL | 0 refills | Status: AC

## 2016-10-09 MED ORDER — ATORVASTATIN CALCIUM 40 MG PO TABS: 40.0000 mg | ORAL_TABLET | Freq: Every day | ORAL | 1 refills | Status: AC

## 2016-10-09 NOTE — ED Notes (Signed)
Hospice in room with patient.

## 2016-10-09 NOTE — ED Provider Notes (Signed)
[facility] DEPT Provider Note   CSN: [id number] Arrival date & time: 10/09/16  1140     History   Chief Complaint Chief Complaint  Patient presents with  . Altered Mental Status    HPI MIRZA FESSEL is a 76 y.o. male. Chief complaint is possible altered mental status  HPI: Mr. Tilden Dome is a 76 year old male with a history of dementia. His dementia has been progressing over several weeks. Seen and evaluated yesterday with concern for "agitation". Yesterday included a CT scan as he has had some recent falls. This was normal. Found to have UTI and given Rocephin. He was discharged early this morning back to [facility], his care facility.  He returns via ambulance after staff called concerned that he was still refusing medical care and refusing his and out catheterizations. He requires every 6 hour straight cath due to urinary retention.  He arrives here. He is joined by his wife and his daughter. His daughter and wife both admit that he has "always said" that he wouldn't want to live in a care facility. His mother had advanced dementia and he will never wanted that for himself.  Patient here really states that he does not want to have catheterizations done. He does not want treatment for his infection. He does not want "any of it". He is not suicidal. He is not intending to perform an activity today and his life. He simply is requesting that his treatment be stopped for his infection and that his straight caths stop.  Per his wife he does have "some signed papers". Nothing from [place] accompanies him here.  Past Medical History:  Diagnosis Date  . Acute encephalopathy 06/16/2015  . Agitation 02/20/2015  . Anemia 11-08-12   mild anemia  . Anxiety   . Anxiety state 06/22/2015  . Chronic kidney disease    must self cath  . Colon polyps   . Constipation 06/27/2016  . CVA (cerebral infarction) 10/02/2011  . Dementia 06/22/2015  . Depression 10/04/2011  . Diabetes mellitus, type 2 ([place])  10/02/2011  . Encephalopathy acute 06/17/2015  . Falls frequently 06/22/2015  . Fecal incontinence   . Fracture of rib of right side 06/15/2015   8th right posterior rib fracture   . Healthcare associated bacterial pneumonia 07/08/2016  . HTN (hypertension) 10/02/2011  . Hyperlipidemia   . Hypertension   . Incontinence 06/22/2015  . Infection of urinary tract 07/25/2015  . Leg weakness 02/20/2015  . 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 Stroke [facility]) 11-08-12   x2 , last 09-29-11(confusion,left side weakness) no residual"some memory problems.  [place] Kitchen UTI (lower urinary tract infection) 06/16/15; 07/25/14  . Weight loss, unintentional 07/25/2015    Patient Active Problem List   Diagnosis Date Noted  . Nursing home resident 07/09/2016  . Healthcare associated bacterial pneumonia 07/08/2016  . Febrile illness   . UTI (urinary tract infection) 07/07/2016  . Pyelonephritis, presumed 07/07/2016  . Vascular dementia without behavioral disturbance   . Constipation 06/27/2016  . Neurogenic bladder, flaccid   . Complicated UTI (urinary tract infection) 08/20/2015  . Colon polyps   . 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 Falls frequently 06/22/2015  . Acute delirium 06/17/2015  . Acute encephalopathy 06/16/2015  . Chronic low back pain 11/24/2014  . Anemia 10/04/2011  . Depression 10/04/2011  . CVA (cerebral infarction) 10/02/2011  . HTN (hypertension) 10/02/2011  . Hyperlipidemia 10/02/2011  . Diabetes mellitus, type  2 ([place]) 10/02/2011    Past Surgical History:  Procedure Laterality Date  . [place]- metal plate and screws for degenerative changes spine  . COLONOSCOPY WITH PROPOFOL N/A 01/12/2013   Procedure: COLONOSCOPY WITH PROPOFOL;  Surgeon: Arta Silence, MD;  Location: [facility] ENDOSCOPY;  Service: Endoscopy;  Laterality: N/A;       Home Medications    Prior to Admission  medications   Medication Sig Start Date End Date Taking? Authorizing Provider  acetaminophen (TYLENOL) 325 MG tablet Take 500 mg by mouth 3 (three) times daily. Take with ibuprofen    [provider]  Ascorbic Acid (VITAMIN C) 1000 MG tablet Take 1 tablet (1,000 mg total) by mouth 2 (two) times daily. 04/02/16   Vivi Barrack, MD  aspirin 81 MG chewable tablet Chew 81 mg by mouth daily.    [provider]  atorvastatin (LIPITOR) 40 MG tablet Take 40 mg by mouth at bedtime. 07/19/15   [provider]  bisacodyl (DULCOLAX) 10 MG suppository Place 10 mg rectally as needed for moderate constipation.    [provider]  Calcium Citrate-Vitamin D (CALCIUM CITRATE + D PO) Take 1,000 mg by mouth daily. For low Ca levels    [provider]  Cranberry 1000 MG CAPS Take 1 capsule (1,000 mg total) by mouth 3 (three) times daily. Patient taking differently: Take 500 mg by mouth 3 (three) times daily.  04/02/16   Vivi Barrack, MD  divalproex (DEPAKOTE) 500 MG DR tablet Take 500 mg by mouth 2 (two) times daily.    [provider]  finasteride (PROSCAR) 5 MG tablet Take 5 mg by mouth daily.    [provider]  ibuprofen (ADVIL,MOTRIN) 200 MG tablet Take 200 mg by mouth 3 (three) times daily. Take with APAP    [provider]  Insulin Glargine (LANTUS SOLOSTAR) 100 UNIT/ML SOPN Inject 35 Units into the skin daily at 10 pm.     [provider]  levofloxacin (LEVAQUIN) 750 MG tablet Take 1 tablet (750 mg total) by mouth daily. X 7 days 10/09/16   Mesner, Corene Cornea, MD  lisinopril (PRINIVIL,ZESTRIL) 5 MG tablet Take 5 mg by mouth every morning.    [provider]  magnesium hydroxide (MILK OF MAGNESIA) 400 MG/5ML suspension Take 30 mLs by mouth daily as needed for mild constipation.    [provider]  methylphenidate (RITALIN) 5 MG tablet Take 0.5 tablets (2.5 mg total) by mouth daily after breakfast. 10/08/16   McDiarmid,  Blane Ohara, MD  Multiple Vitamin (MULTIVITAMIN WITH MINERALS) TABS Take 1 tablet by mouth daily.    [provider]  Olopatadine HCl 0.2 % SOLN Place 1 drop into both eyes daily.    [provider]  ondansetron (ZOFRAN-ODT) 4 MG disintegrating tablet Take 4 mg by mouth every 8 (eight) hours as needed for nausea or vomiting.    [provider]  pantoprazole (PROTONIX) 20 MG tablet Take 20 mg by mouth daily.    [provider]  senna (SENOKOT) 8.6 MG TABS tablet Take 2 tablets by mouth at bedtime.    [provider]  sodium chloride (OCEAN) 0.65 % SOLN nasal spray Place 2 sprays into both nostrils 2 (two) times daily as needed for congestion.    [provider]  Sodium Phosphates (RA SALINE ENEMA) 19-7 GM/118ML ENEM Place 1 each rectally as needed (for constipation).    [provider]  tamsulosin (FLOMAX) 0.4 MG  CAPS capsule Take 0.8 mg by mouth daily.    [provider]  venlafaxine XR (EFFEXOR-XR) 75 MG 24 hr capsule Take 75 mg by mouth daily. 08/16/15   [provider]  Wheat Dextrin (BENEFIBER PO) Take 7.5 mLs by mouth daily. Reported on 08/17/2015    [provider]    Family History Family History  Problem Relation Age of Onset  . Dementia Mother   . Alzheimer's disease Mother   . Stroke Mother   . Lung cancer Father   . Colon cancer Maternal Grandmother     Social History Social History  Substance Use Topics  . Smoking status: Former Smoker    Packs/day: 1.00    Years: 5.00    Types: Cigarettes  . Smokeless tobacco: Former Systems developer    Types: Chew    Quit date: 05/26/1962  . Alcohol use Yes     Comment: only rarely     Allergies   Metformin and related; Ciprofloxacin; and Vicodin [hydrocodone-acetaminophen]   Review of Systems Review of Systems  Constitutional: Negative for appetite change, chills, diaphoresis, fatigue and fever.  HENT: Negative for mouth sores, sore throat and trouble  swallowing.   Eyes: Negative for visual disturbance.  Respiratory: Negative for cough, chest tightness, shortness of breath and wheezing.   Cardiovascular: Negative for chest pain.  Gastrointestinal: Negative for abdominal distention, abdominal pain, diarrhea, nausea and vomiting.  Endocrine: Negative for polydipsia, polyphagia and polyuria.  Genitourinary: Negative for dysuria, frequency and hematuria.  Musculoskeletal: Negative for gait problem.  Skin: Negative for color change, pallor and rash.  Neurological: Negative for dizziness, syncope, light-headedness and headaches.  Hematological: Does not bruise/bleed easily.  Psychiatric/Behavioral: Negative for behavioral problems and confusion.     Physical Exam Updated Vital Signs BP (!) 143/77   Pulse 92   Temp 98.9 F (37.2 C) (Oral)   Resp (!) 25   SpO2 95%   Physical Exam  Constitutional: He is oriented to person, place, and time. He appears well-developed and well-nourished. No distress.  Patient says with his eyes closed. He answers questions appropriately. He is calm. He is oriented to person and place. He is off on the date.  HENT:  Head: Normocephalic.  Eyes: Conjunctivae are normal. Pupils are equal, round, and reactive to light. No scleral icterus.  Neck: Normal range of motion. Neck supple. No thyromegaly present.  Cardiovascular: Normal rate and regular rhythm.  Exam reveals no gallop and no friction rub.   No murmur heard. Pulmonary/Chest: Effort normal and breath sounds normal. No respiratory distress. He has no wheezes. He has no rales.  Abdominal: Soft. Bowel sounds are normal. He exhibits no distension. There is no tenderness. There is no rebound.  Musculoskeletal: Normal range of motion.  Neurological: He is alert and oriented to person, place, and time.  Skin: Skin is warm and dry. No rash noted.  Psychiatric: He has a normal mood and affect. His behavior is normal.     ED Treatments / Results  Labs (all  labs ordered are listed, but only abnormal results are displayed) Labs Reviewed - No data to display  EKG  EKG Interpretation None       Radiology Dg Chest 2 View  Result Date: 10/08/2016 CLINICAL DATA:  Shortness of breath EXAM: CHEST  2 VIEW COMPARISON:  07/08/2016 FINDINGS: Mildly low lung volumes. No focal consolidation or effusion. Borderline cardiomegaly. Aortic atherosclerosis. No pneumothorax. IMPRESSION: Low lung volumes.  No infiltrate or edema. Electronically Signed  By: Donavan Foil M.D.   On: 10/08/2016 23:26   Ct Head Wo Contrast  Result Date: 10/08/2016 CLINICAL DATA:  Dementia patient with increased confusion and agitation. EXAM: CT HEAD WITHOUT CONTRAST TECHNIQUE: Contiguous axial images were obtained from the base of the skull through the vertex without intravenous contrast. COMPARISON:  Most recent head CT 07/06/2016 FINDINGS: Brain: Stable degree of atrophy and chronic small vessel ischemia. Remote lacunar infarcts in the right basal ganglia. Minimal encephalomalacia in the left frontal lobe, sequela of remote ischemia. No intracranial hemorrhage. No evidence of acute large vessel ischemia. No subdural or extra-axial fluid collection. No mass effect or midline shift. Vascular: Atherosclerosis of skullbase vasculature without hyperdense vessel or abnormal calcification. Skull: No fracture or focal lesion. Sinuses/Orbits: Decreased mucosal thickening of the a paranasal sinuses. Resolved bilateral mastoid effusions. Visualized orbits are unremarkable. Other: None. IMPRESSION: No intracranial hemorrhage. Stable atrophy and remote/ chronic small vessel ischemia. Electronically Signed   By: Jeb Levering M.D.   On: 10/08/2016 23:37    Procedures Procedures (including critical care time)  Medications Ordered in ED Medications - No data to display   Initial Impression / Assessment and Plan / ED Course  I have reviewed the triage vital signs and the nursing  notes.  Pertinent labs & imaging results that were available during my care of the patient were reviewed by me and considered in my medical decision making (see chart for details).   patient with recent urinary tract infection. Currently of appropriate mental status to be involved in decision making regarding his medical care. He is refusing medications and his straight cath. I do not feel he needs any testing or specific treatment today. When the topic of not treating his infection comes up his wife becomes overtly emotional. She goes quickly to his bedside and holds his head and pats his hair and states "I don't want you to go"!"   I think it is this patient's right to decline medical care for himself. However, I feel his family is making it difficult. This next message is being played out in the patient's multiple return visits the ambulance to this facility from [facility] his care facility.  This patient and family need a clear plan regarding this patient's wishes. These need to be placed in motion and documented. I've asked care manager to have hospice visit with the patient and his family. I discussed the case with Ann Held of care. They also will discuss end-of-life care and DO NOT RESUSCITATE, do not treat, power of attorney, living Will etc. with patient and family to be certain that everyone is "on the same page".  Hospice, and palliative care evaluations are pending. I ultimately anticipate this patient's return to [facility]. Hopefully with clarity regarding plan of procedures, medications, CODE STATUS, etc.    Final Clinical Impressions(s) / ED Diagnoses   Final diagnoses:  Urinary tract infection without hematuria, site unspecified    New Prescriptions New Prescriptions   No medications on file     Tanna Furry, MD 10/09/16 1436

## 2016-10-09 NOTE — ED Triage Notes (Signed)
Pt arrives via [facility] from [place], dx with UTI/on abx, staff reports pt is more altered than normal and has had some "behavioral changes." pt seen here yesterday for the same. Per ems pt is at his baseline mental status.

## 2016-10-09 NOTE — Care Management Note (Signed)
Case Management Note  Patient Details  Name: Reginald Patterson MRN: [id number] Date of Birth: 1941-02-25  Subjective/Objective:     AMS, UTI, falls             Action/Plan: Discharge Planning: NCM spoke to pt, wife, [name] and [name] at bedside. Offered choice for Home Hospice. Dtr states pt was followed by Palliative Services at [facility]. They are requesting to take him home with Hospice Services. Wife is requesting hospital bed and bedside commode. States they can arrange hospital bed once pt get home. Will arrange PTAR to home.   PCP [name] MD  Expected Discharge Date:                  Expected Discharge Plan:  Home w Hospice Care  In-House Referral:  NA  Discharge planning Services  CM Consult  Post Acute Care Choice:  Hospice Choice offered to:  Spouse, Adult Children  DME Arranged:  Hospital bed, 3-N-1 DME Agency:  [place]:  RN [facility] Agency:  Hospice and [place]  Status of Service:  Completed, signed off  If discussed at [place] of Stay Meetings, dates discussed:    Additional Comments:  [name], RN 10/09/2016, 3:06 PM

## 2016-10-09 NOTE — ED Notes (Signed)
Attempted report x1 to [facility] w/ no answer. Spoke w/ [place] and she stated she would have the nurse call me back.

## 2016-10-09 NOTE — ED Notes (Signed)
PTAR called for transport.  

## 2016-10-09 NOTE — Progress Notes (Signed)
[place] RN Visit--ED B-16  Notified by Jonnie Finner, CMRN of patient/family request for [facility] services at home after discharge. Chart and patient information are being reviewed with [facility] physician.   Writer spoke with patient, wife Fraser Din and daughter at bedside, Halford Chessman, family friend arrived mid-visit, to initiate education related to hospice philosophy, services and team approach to care. Family verbalized understanding of information given. Per discussion plan is for immediate discharge to home by PTAR.   Signed DNR accompanies patient (delivered to ED by [facility] staff).   DME needs discussed, patient currently has a BSC and wheelchair in the home. Requested hospital bed with 1/2 rails and OBT and O2 @ 2lpm prn. [facility] called directly for equipment and is to be delivered between 5pm-9pm this evening. Jewel Ysidro Evert was notified.  [facility] is aware of the above. Completed discharge summary will need to be faxed to [facility] at [phone], when final. [facility] notified of patient discharge by [facility].   Above information shared with Jonnie Finner, CMRN.  Thank you for the referral.  Margaretmary Eddy, [place] are now on AMION.

## 2016-10-09 NOTE — ED Notes (Signed)
Hospice worker informed this RN that pt will not get a foley, pt is ready to be d/c home and ptar to be called.

## 2016-10-09 NOTE — ED Notes (Signed)
Palliative Care at bedside.  

## 2016-10-09 NOTE — Telephone Encounter (Signed)
Discussed patient with Dr. Rivka Barbara per family he is back to normal self in the ED, no longer combative. Apparently patient voiced dislike for his current nursing home as some reason for combative nature, per family also has been increasingly combative with progression of dementia. Previously patient has been diagnosised with UTI when he is very combative  Some concern about UTI, patient received a Ceftriaxone 1 g IV in the ED, will likely need PO treatment at the nursing home.Will route to Dr. Alease Frame and Dr.McDiramid to make sure patient receives PO antibiotic tomorrow for UTI as patient refused medications today. CT head negative.

## 2016-10-09 NOTE — ED Notes (Signed)
Pt's family members verbalized understanding of d/c instructions and has no further questions. VSS, NAD at this time. Pt d/c home with ptar to home. Hospice saw patient and will follow patient. Pt provided with prescriptions.

## 2016-10-09 NOTE — Progress Notes (Signed)
Faxed AVS to [place].Jonnie Finner RN CCM Case Mgmt phone [phone]

## 2016-10-10 LAB — URINE CULTURE: Culture: 80000 — AB

## 2016-10-10 NOTE — ED Provider Notes (Signed)
[facility] DEPT Provider Note   CSN: [id number] Arrival date & time: 10/08/16  2141     History   Chief Complaint Chief Complaint  Patient presents with  . Altered Mental Status    HPI Reginald Patterson is a 76 y.o. male.   Altered Mental Status   This is a recurrent problem. The problem has been resolved. Associated symptoms include confusion, agitation and violence. Risk factors include a recent infection.    Past Medical History:  Diagnosis Date  . Acute encephalopathy 06/16/2015  . Agitation 02/20/2015  . Anemia 11-08-12   mild anemia  . Anxiety   . Anxiety state 06/22/2015  . Chronic kidney disease    must self cath  . Colon polyps   . Constipation 06/27/2016  . CVA (cerebral infarction) 10/02/2011  . Dementia 06/22/2015  . Depression 10/04/2011  . Diabetes mellitus, type 2 ([place]) 10/02/2011  . Encephalopathy acute 06/17/2015  . Falls frequently 06/22/2015  . Fecal incontinence   . Fracture of rib of right side 06/15/2015   8th right posterior rib fracture   . Healthcare associated bacterial pneumonia 07/08/2016  . HTN (hypertension) 10/02/2011  . Hyperlipidemia   . Hypertension   . Incontinence 06/22/2015  . Infection of urinary tract 07/25/2015  . Leg weakness 02/20/2015  . 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 Stroke [facility]) 11-08-12   x2 , last 09-29-11(confusion,left side weakness) no residual"some memory problems.  [facility] UTI (lower urinary tract infection) 06/16/15; 07/25/14  . Weight loss, unintentional 07/25/2015    Patient Active Problem List   Diagnosis Date Noted  . Nursing home resident 07/09/2016  . Healthcare associated bacterial pneumonia 07/08/2016  . Febrile illness   . UTI (urinary tract infection) 07/07/2016  . Pyelonephritis, presumed 07/07/2016  . Vascular dementia without behavioral disturbance   . Constipation 06/27/2016  . Neurogenic bladder, flaccid   . Complicated UTI (urinary tract infection) 08/20/2015  . Colon polyps   . 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 Falls frequently 06/22/2015  . Acute delirium 06/17/2015  . Acute encephalopathy 06/16/2015  . Chronic low back pain 11/24/2014  . Anemia 10/04/2011  . Depression 10/04/2011  . CVA (cerebral infarction) 10/02/2011  . HTN (hypertension) 10/02/2011  . Hyperlipidemia 10/02/2011  . Diabetes mellitus, type 2 ([place]) 10/02/2011    Past Surgical History:  Procedure Laterality Date  . [place]- metal plate and screws for degenerative changes spine  . COLONOSCOPY WITH PROPOFOL N/A 01/12/2013   Procedure: COLONOSCOPY WITH PROPOFOL;  Surgeon: Arta Silence, MD;  Location: [facility] ENDOSCOPY;  Service: Endoscopy;  Laterality: N/A;       Home Medications    Prior to Admission medications   Medication Sig Start Date End Date Taking? Authorizing Provider  acetaminophen (TYLENOL) 325 MG tablet Take 500 mg by mouth 3 (three) times daily. Take with ibuprofen    [provider]  Ascorbic Acid (VITAMIN C) 1000 MG tablet Take 1 tablet (1,000 mg total) by mouth 2 (two) times daily. 04/02/16   Vivi Barrack, MD  aspirin 81 MG chewable tablet Chew 81 mg by mouth daily.    [provider]  atorvastatin (LIPITOR) 40 MG tablet Take 1 tablet (40 mg total) by mouth daily. 10/09/16   Tanna Furry, MD  bisacodyl (DULCOLAX) 10 MG suppository Place 10 mg rectally as needed for moderate constipation.    [provider]  Calcium  Citrate-Vitamin D (CALCIUM CITRATE + D PO) Take 1,000 mg by mouth daily. For low Ca levels    [provider]  Cranberry 1000 MG CAPS Take 1 capsule (1,000 mg total) by mouth 3 (three) times daily. Patient taking differently: Take 500 mg by mouth 3 (three) times daily.  04/02/16   Vivi Barrack, MD  divalproex (DEPAKOTE) 500 MG DR tablet Take 1 tablet (500 mg total) by mouth 2 (two) times daily. 10/09/16   Tanna Furry, MD    finasteride (PROSCAR) 5 MG tablet Take 1 tablet (5 mg total) by mouth daily. 10/09/16   Tanna Furry, MD  ibuprofen (ADVIL,MOTRIN) 200 MG tablet Take 200 mg by mouth 3 (three) times daily. Take with APAP    [provider]  Insulin Glargine (LANTUS SOLOSTAR) 100 UNIT/ML Solostar Pen 35 units sqq qhs 10/09/16   Tanna Furry, MD  levofloxacin (LEVAQUIN) 750 MG tablet Take 1 tablet (750 mg total) by mouth daily. 10/09/16   Tanna Furry, MD  lisinopril (PRINIVIL,ZESTRIL) 5 MG tablet Take 1 tablet (5 mg total) by mouth daily. 10/09/16   Tanna Furry, MD  magnesium hydroxide (MILK OF MAGNESIA) 400 MG/5ML suspension Take 30 mLs by mouth daily as needed for mild constipation.    [provider]  methylphenidate (RITALIN) 5 MG tablet Take 0.5 tablets (2.5 mg total) by mouth daily. 10/09/16   Tanna Furry, MD  Multiple Vitamin (MULTIVITAMIN WITH MINERALS) TABS Take 1 tablet by mouth daily.    [provider]  Olopatadine HCl 0.2 % SOLN Place 1 drop into both eyes daily.    [provider]  ondansetron (ZOFRAN-ODT) 4 MG disintegrating tablet Take 4 mg by mouth every 8 (eight) hours as needed for nausea or vomiting.    [provider]  pantoprazole (PROTONIX) 20 MG tablet Take 1 tablet (20 mg total) by mouth daily. 10/09/16   Tanna Furry, MD  senna-docusate (SENOKOT-S) 8.6-50 MG tablet Take 1 tablet by mouth at bedtime. 10/09/16   Tanna Furry, MD  sodium chloride (OCEAN) 0.65 % SOLN nasal spray Place 2 sprays into both nostrils 2 (two) times daily as needed for congestion.    [provider]  Sodium Phosphates (RA SALINE ENEMA) 19-7 GM/118ML ENEM Place 1 each rectally as needed (for constipation).    [provider]  tamsulosin (FLOMAX) 0.4 MG CAPS capsule Take 1 capsule (0.4 mg total) by mouth daily. 10/09/16   Tanna Furry, MD  venlafaxine XR (EFFEXOR XR) 75 MG 24 hr capsule Take 1 capsule (75 mg total) by mouth daily with breakfast. 10/09/16   Tanna Furry, MD   Wheat Dextrin (BENEFIBER PO) Take 7.5 mLs by mouth daily. Reported on 08/17/2015    [provider]    Family History Family History  Problem Relation Age of Onset  . Dementia Mother   . Alzheimer's disease Mother   . Stroke Mother   . Lung cancer Father   . Colon cancer Maternal Grandmother     Social History Social History  Substance Use Topics  . Smoking status: Former Smoker    Packs/day: 1.00    Years: 5.00    Types: Cigarettes  . Smokeless tobacco: Former Systems developer    Types: Chew    Quit date: 05/26/1962  . Alcohol use Yes     Comment: only rarely     Allergies   Metformin and related; Ciprofloxacin; and Vicodin [hydrocodone-acetaminophen]   Review of Systems Review of Systems  Psychiatric/Behavioral: Positive for agitation and confusion.  All other systems reviewed and are negative.    Physical Exam Updated Vital Signs BP (!) 160/83   Pulse 79   Temp 98.7 F (37.1 C) (Oral)   Resp (!) 25   SpO2 94%   Physical Exam  Constitutional: He appears well-developed and well-nourished.  HENT:  Head: Normocephalic and atraumatic.  Eyes: Conjunctivae and EOM are normal.  Neck: Normal range of motion.  Cardiovascular: Normal rate.   Pulmonary/Chest: Effort normal. No respiratory distress.  Abdominal: Soft. He exhibits no distension.  Musculoskeletal: Normal range of motion.  Neurological: He is alert.  Skin: Skin is warm and dry.  Nursing note and vitals reviewed.    ED Treatments / Results  Labs (all labs ordered are listed, but only abnormal results are displayed) Labs Reviewed  COMPREHENSIVE METABOLIC PANEL - Abnormal; Notable for the following:       Result Value   Glucose, Bld 232 (*)    BUN 21 (*)    Creatinine, Ser 1.29 (*)    Total Protein 6.4 (*)    GFR calc non Af Amer 53 (*)    All other components within normal limits  CBC - Abnormal; Notable for the following:    WBC 10.9 (*)    All other components within normal limits   URINALYSIS, ROUTINE W REFLEX MICROSCOPIC - Abnormal; Notable for the following:    APPearance HAZY (*)    Glucose, UA >=500 (*)    Ketones, ur 5 (*)    Protein, ur 30 (*)    Leukocytes, UA LARGE (*)    Bacteria, UA RARE (*)    Squamous Epithelial / LPF 0-5 (*)    All other components within normal limits  CBG MONITORING, ED - Abnormal; Notable for the following:    Glucose-Capillary 262 (*)    All other components within normal limits  URINE CULTURE    EKG  EKG Interpretation None       Radiology Dg Chest 2 View  Result Date: 10/08/2016 CLINICAL DATA:  Shortness of breath EXAM: CHEST  2 VIEW COMPARISON:  07/08/2016 FINDINGS: Mildly low lung volumes. No focal consolidation or effusion. Borderline cardiomegaly. Aortic atherosclerosis. No pneumothorax. IMPRESSION: Low lung volumes.  No infiltrate or edema. Electronically Signed   By: Donavan Foil M.D.   On: 10/08/2016 23:26   Ct Head Wo Contrast  Result Date: 10/08/2016 CLINICAL DATA:  Dementia patient with increased confusion and agitation. EXAM: CT HEAD WITHOUT CONTRAST TECHNIQUE: Contiguous axial images were obtained from the base of the skull through the vertex without intravenous contrast. COMPARISON:  Most recent head CT 07/06/2016 FINDINGS: Brain: Stable degree of atrophy and chronic small vessel ischemia. Remote lacunar infarcts in the right basal ganglia. Minimal encephalomalacia in the left frontal lobe, sequela of remote ischemia. No intracranial hemorrhage. No evidence of acute large vessel ischemia. No subdural or extra-axial fluid collection. No mass effect or midline shift. Vascular: Atherosclerosis of skullbase vasculature without hyperdense vessel or abnormal calcification. Skull: No fracture or focal lesion. Sinuses/Orbits: Decreased mucosal thickening of the a paranasal sinuses. Resolved bilateral mastoid effusions. Visualized orbits are unremarkable. Other: None. IMPRESSION: No intracranial hemorrhage. Stable atrophy  and remote/ chronic small vessel ischemia. Electronically Signed   By: Jeb Levering M.D.   On: 10/08/2016 23:37    Procedures Procedures (including critical care time)  Medications Ordered in ED Medications  sodium chloride 0.9 % bolus 1,000 mL (0 mLs Intravenous Stopped 10/09/16 0048)  levofloxacin (LEVAQUIN) IVPB 750 mg (0 mg Intravenous  Stopped 10/09/16 0218)     Initial Impression / Assessment and Plan / ED Course  I have reviewed the triage vital signs and the nursing notes.  Pertinent labs & imaging results that were available during my care of the patient were reviewed by me and considered in my medical decision making (see chart for details).     Here with likely UTI. Discussed admissison with family medicine but ultimately decied he could be treated and observed appropriately at SNF where his PCP could see him in the morning (and get IV antibiotics if needed). Patient at baseline mental status now, plan for discharge.   Final Clinical Impressions(s) / ED Diagnoses   Final diagnoses:  Urinary tract infection without hematuria, site unspecified    New Prescriptions Discharge Medication List as of 10/09/2016 12:48 AM    START taking these medications   Details  levofloxacin (LEVAQUIN) 750 MG tablet Take 1 tablet (750 mg total) by mouth daily. X 7 days, Starting Thu 10/09/2016, Print         Tamiyah Moulin, Corene Cornea, MD 10/10/16 1150

## 2016-10-14 ENCOUNTER — Telehealth: Payer: Self-pay | Admitting: *Deleted

## 2016-10-14 NOTE — Telephone Encounter (Signed)
[facility] called to informed provider that patient will be admitted to [facility] today for end of life.  Derl Barrow, RN

## 2016-10-22 ENCOUNTER — Encounter: Payer: Self-pay | Admitting: Family Medicine

## 2016-10-22 ENCOUNTER — Non-Acute Institutional Stay (INDEPENDENT_AMBULATORY_CARE_PROVIDER_SITE_OTHER): Payer: Medicare Other | Admitting: Family Medicine

## 2016-10-22 DIAGNOSIS — I7090 Unspecified atherosclerosis: Principal | ICD-10-CM

## 2016-10-22 DIAGNOSIS — F0150 Vascular dementia without behavioral disturbance: Secondary | ICD-10-CM

## 2016-10-22 DIAGNOSIS — I70209 Unspecified atherosclerosis of native arteries of extremities, unspecified extremity: Secondary | ICD-10-CM

## 2016-10-24 ENCOUNTER — Telehealth: Payer: Self-pay | Admitting: Family Medicine

## 2016-10-24 ENCOUNTER — Encounter: Payer: Self-pay | Admitting: Family Medicine

## 2016-10-24 NOTE — Telephone Encounter (Signed)
Death certificate for patient has been dropped off by funeral home. Pt is set to be cremated. D/C has been given to Dr. McDiarmid to complete. Will await completion.

## 2016-10-24 NOTE — Progress Notes (Signed)
Note generated in order to remove patient from [facility] patient census

## 2016-10-24 NOTE — Progress Notes (Signed)
Death Certificate completed by Lissa Morales, MD Cause of death atherosclerosis  Manner of death: natural

## 2016-10-24 DEATH — deceased

## 2017-07-08 IMAGING — CT CT CERVICAL SPINE W/O CM
4 series · 16 of 33 positions shown, 19 images · non-contrast
Comparison: 05/10/2015

CLINICAL DATA: Increasing weakness and confusion, fall 6 weeks ago,
initial encounter

EXAM:
CT HEAD WITHOUT CONTRAST
CT CERVICAL SPINE WITHOUT CONTRAST
TECHNIQUE: Multidetector CT imaging of the head and cervical spine was
performed following the standard protocol without intravenous
contrast. Multiplanar CT image reconstructions of the cervical spine
were also generated.

[Series 4: c_spine 2.0 st · axial · 0.29mm/px · z∈[-257,-143]mm · 5 of 87 slices shown, 7 images]
[im 15/87  soft-tissue]
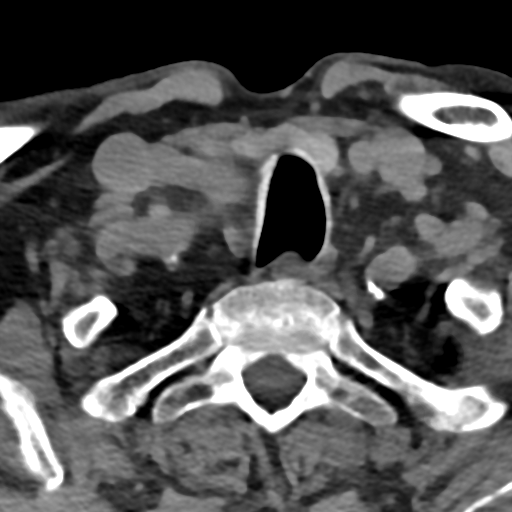
[im 15/87  bone]
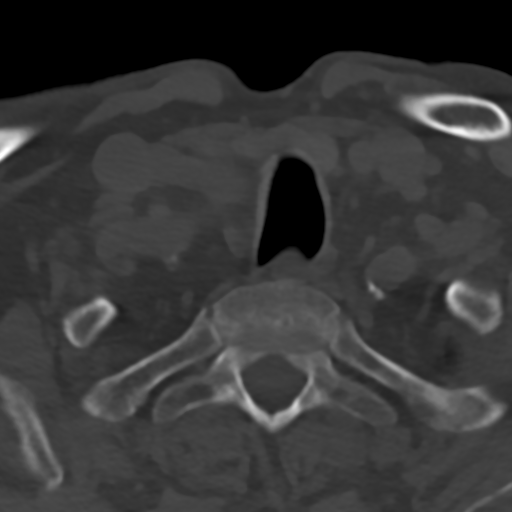
[im 29/87  bone]
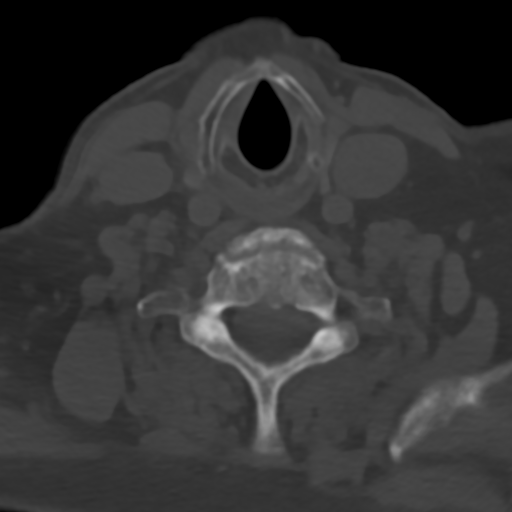
[im 44/87  bone]
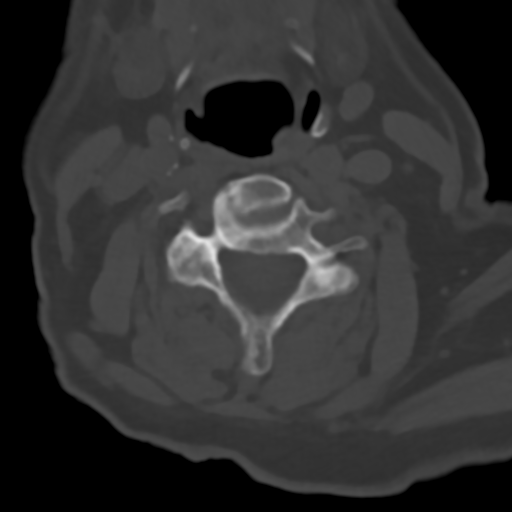
[im 58/87  bone]
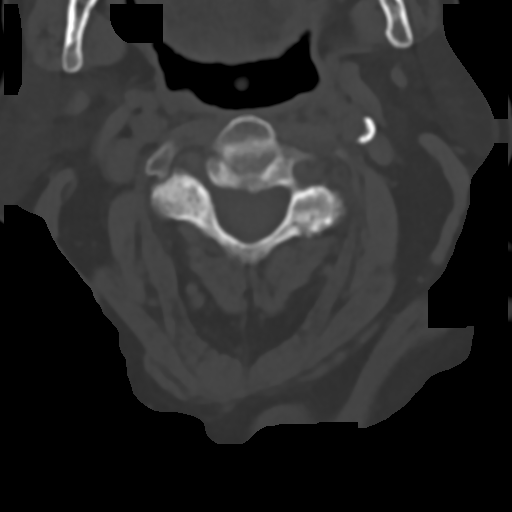
[im 72/87  soft-tissue]
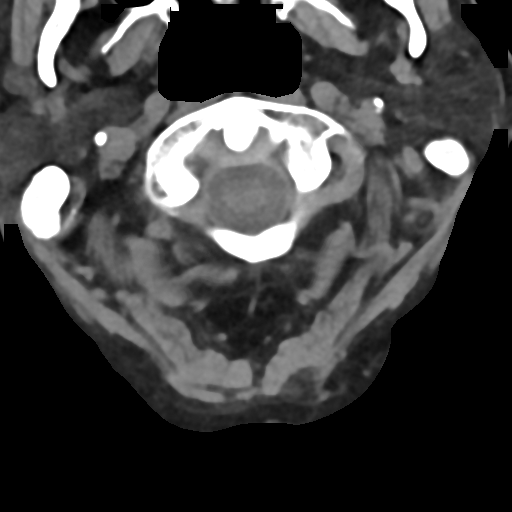
[im 72/87  bone]
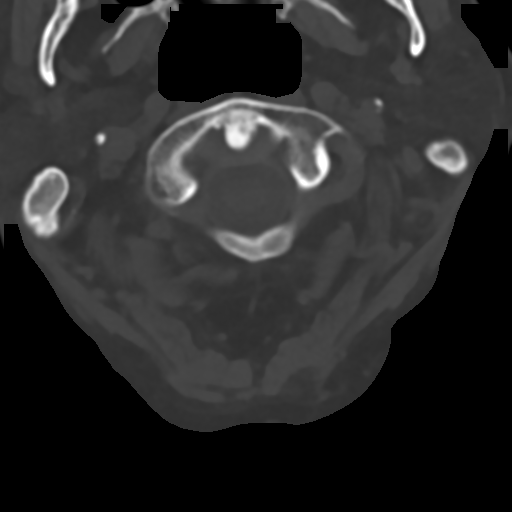

[Series 602: sag · sagittal · 0.51mm/px · 5 of 46 slices shown, 6 images]
[im 16/46  bone]
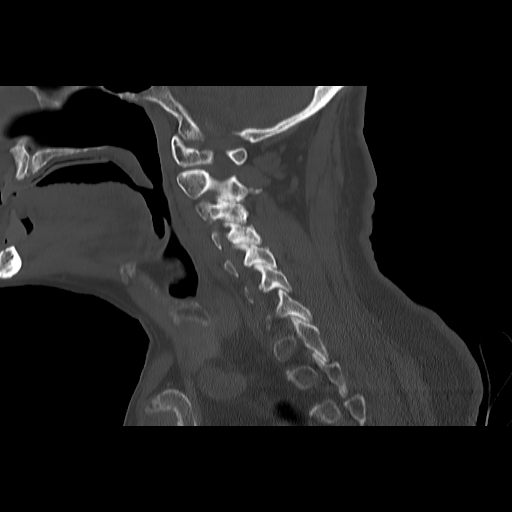
[im 19/46  bone]
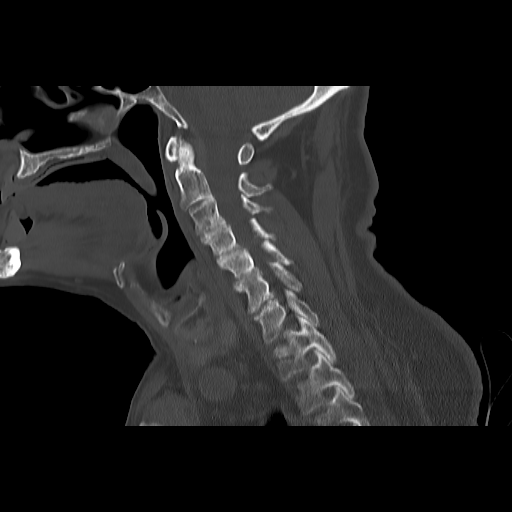
[im 23/46  soft-tissue]
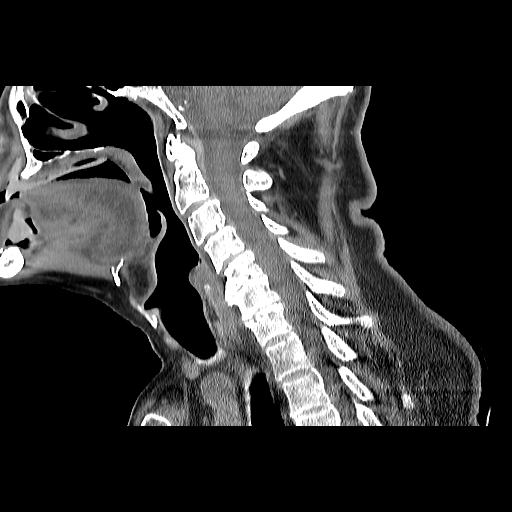
[im 23/46  bone]
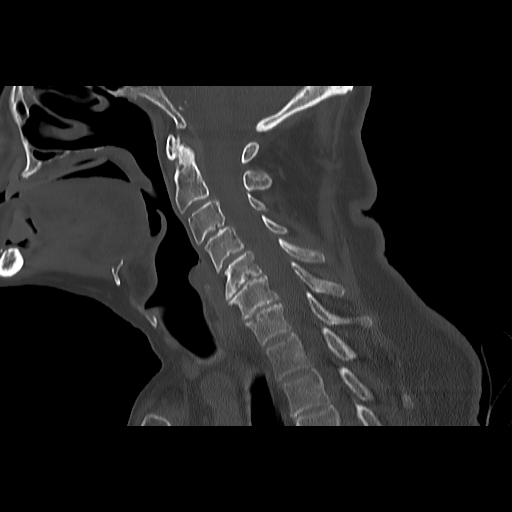
[im 27/46  bone]
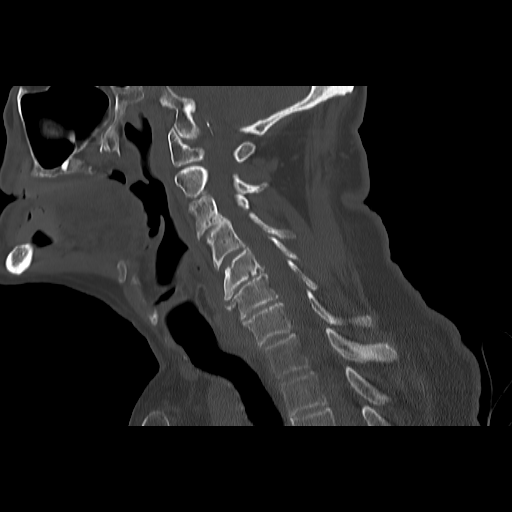
[im 31/46  bone]
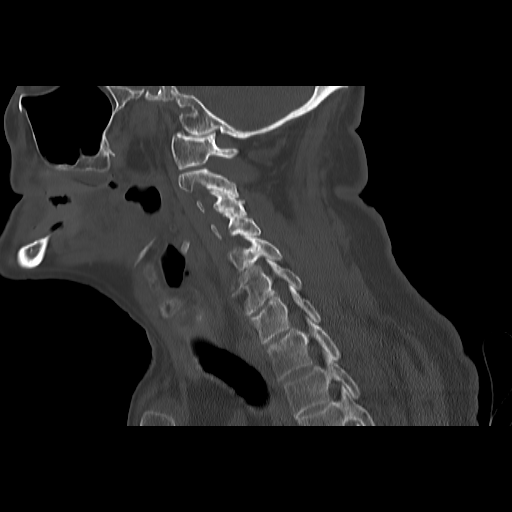

[Series 603: cor · coronal · 0.51mm/px · 3 of 56 slices shown]
[im 12/56  bone]
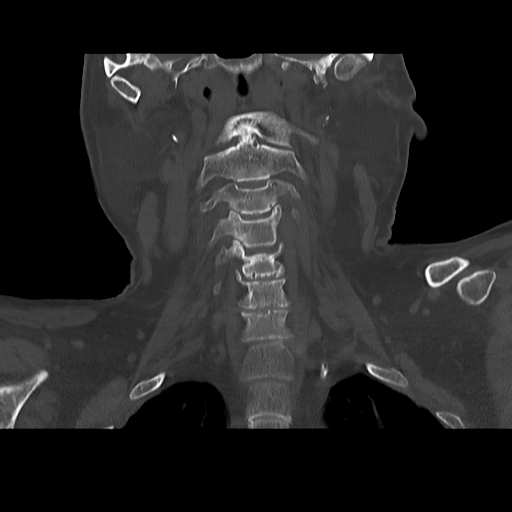
[im 23/56  bone]
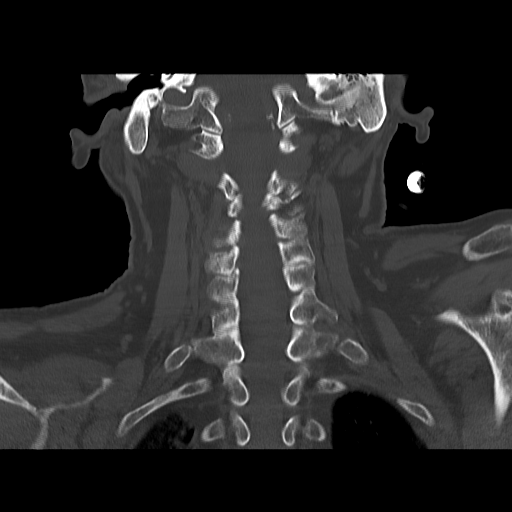
[im 34/56  bone]
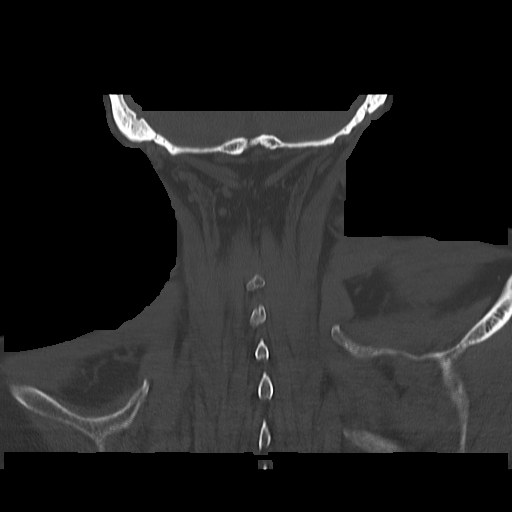

[Series 604: orthogonal · axial · 0.51mm/px · z∈[-297,-243]mm · 3 of 87 slices shown]
[im 15/87  bone]
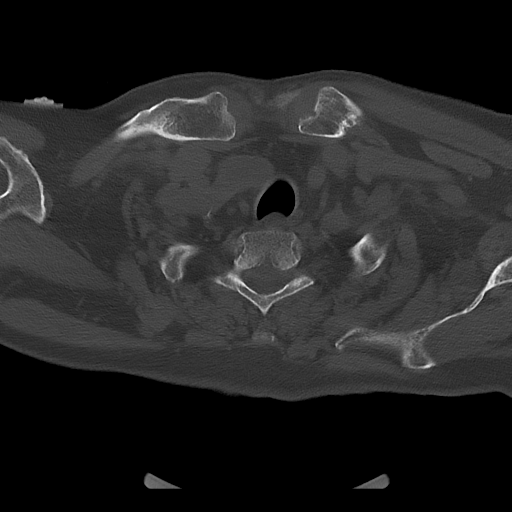
[im 29/87  bone]
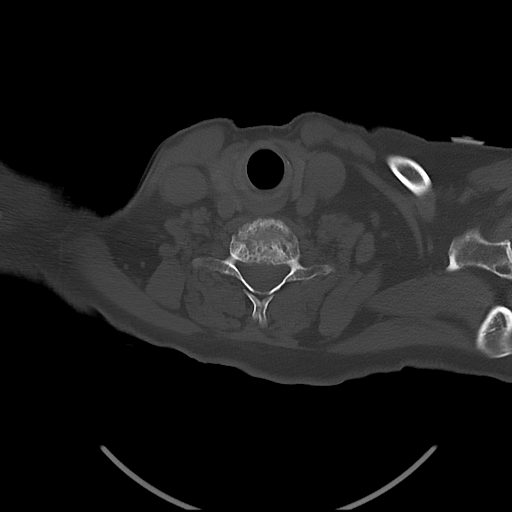
[im 44/87  bone]
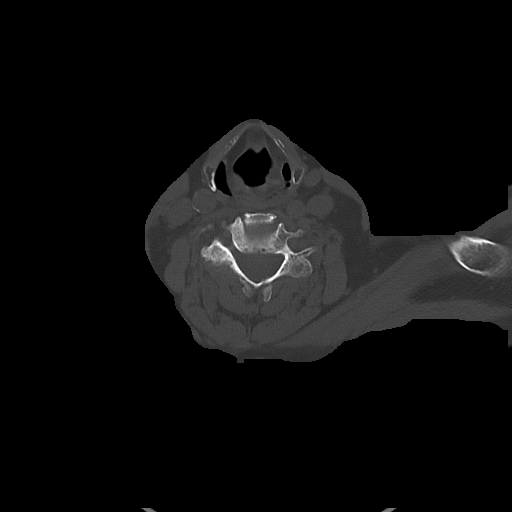

[16 of 33 positions shown; findings below may reference images not displayed]

FINDINGS: CT HEAD FINDINGS

The bony calvarium is intact. Diffuse atrophic changes are noted.
Areas of decreased attenuation are again seen consistent with
chronic white matter ischemic change. No findings to suggest acute
hemorrhage, acute infarction or space-occupying mass lesion are
noted.

CT CERVICAL SPINE FINDINGS

Seven cervical segments are well visualized. Vertebral body height
is well maintained. Disc space narrowing is noted at C5-6 and C6-7
with associated osteophytic changes. Facet hypertrophic changes are
identified. No acute fracture or acute facet abnormality is noted.
The odontoid is within normal limits. The surrounding soft tissues
show no acute abnormality.
IMPRESSION: CT of the head: Chronic atrophic and ischemic changes

CT of cervical spine: Multilevel degenerative changes without acute
abnormality.
